# Patient Record
Sex: Male | Born: 1980 | Hispanic: Yes | Marital: Married | Smoking: Former smoker
Health system: Southern US, Community
[De-identification: ages and names within clinical notes are randomized; demographics above are authoritative.]

## PROBLEM LIST (undated history)

## (undated) DIAGNOSIS — E739 Lactose intolerance, unspecified: Secondary | ICD-10-CM

## (undated) DIAGNOSIS — E559 Vitamin D deficiency, unspecified: Secondary | ICD-10-CM

## (undated) DIAGNOSIS — E785 Hyperlipidemia, unspecified: Secondary | ICD-10-CM

## (undated) DIAGNOSIS — K760 Fatty (change of) liver, not elsewhere classified: Secondary | ICD-10-CM

## (undated) DIAGNOSIS — E7800 Pure hypercholesterolemia, unspecified: Secondary | ICD-10-CM

## (undated) DIAGNOSIS — E781 Pure hyperglyceridemia: Secondary | ICD-10-CM

## (undated) HISTORY — DX: Hyperlipidemia, unspecified: E78.5

## (undated) HISTORY — DX: Lactose intolerance, unspecified: E73.9

## (undated) HISTORY — DX: Vitamin D deficiency, unspecified: E55.9

## (undated) HISTORY — DX: Fatty (change of) liver, not elsewhere classified: K76.0

## (undated) HISTORY — DX: Pure hyperglyceridemia: E78.1

## (undated) HISTORY — DX: Pure hypercholesterolemia, unspecified: E78.00

---

## 2007-06-21 HISTORY — PX: KNEE ARTHROSCOPY: SUR90

## 2013-01-30 ENCOUNTER — Ambulatory Visit (INDEPENDENT_AMBULATORY_CARE_PROVIDER_SITE_OTHER): Payer: BC Managed Care – PPO | Admitting: Internal Medicine

## 2013-01-30 ENCOUNTER — Encounter: Payer: Self-pay | Admitting: Internal Medicine

## 2013-01-30 VITALS — BP 125/85 | HR 74 | Temp 98.0°F | Ht 69.0 in | Wt 230.6 lb

## 2013-01-30 DIAGNOSIS — L989 Disorder of the skin and subcutaneous tissue, unspecified: Secondary | ICD-10-CM

## 2013-01-30 DIAGNOSIS — Z0000 Encounter for general adult medical examination without abnormal findings: Secondary | ICD-10-CM

## 2013-01-30 DIAGNOSIS — Z23 Encounter for immunization: Secondary | ICD-10-CM

## 2013-01-30 NOTE — Assessment & Plan Note (Signed)
Tdap today He is improving his diet and losing some weight, praised!. His activity 3 or 4 times a week Consulate about self testicular exam Labs Return every year and as needed

## 2013-01-30 NOTE — Patient Instructions (Addendum)
Please come back fasting: FLP, CBC, CMP, TSH--- dx v70 Next visit in one year and as needed  Testicular Problems and Self-Exam Men can examine themselves easily and effectively with positive results. Monthly exams detect problems early and save lives. There are numerous causes of swelling in the testicle. Testicular cancer usually appears as a firm painless lump in the front part of the testicle. This may feel like a dull ache or heavy feeling located in the lower abdomen (belly), groin, or scrotum.  The risk is greater in men with undescended testicles and it is more common in young men. It is responsible for almost a fifth of cancers in males between ages 29 and 34. Other common causes of swellings, lumps, and testicular pain include injuries, inflammation (soreness) from infection, hydrocele, and torsion. These are a few of the reasons to do monthly self-examination of the testicles. The exam only takes minutes and could add years to your life. Get in the habit! SELF-EXAMINATION OF THE TESTICLES The testicles are easiest to examine after warm baths or showers and are more difficult to examine when you are cold. This is because the muscles attached to the testicles retract and pull them up higher or into the abdomen. While standing, roll one testicle between the thumb and forefinger. Feel for lumps, swelling, or discomfort. A normal testicle is egg shaped and feels firm. It is smooth and not tender. The spermatic cord can be felt as a firm spaghetti-like cord at the back of the testicle. It is also important to examine your groins. This is the crease between the front of your leg and your abdomen. Also, feel for enlarged lymph nodes (glands). Enlarged nodes are also a cause for you to see your caregiver for evaluation.  Self-examination of the testicles and groin areas on a regular basis will help you to know what your own testicles and groins feel like. This will help you pick up an abnormality  (difference) at an earlier stage. Early discovery is the key to curing this cancer or treating other conditions. Any lump, change, or swelling in the testicle calls for immediate evaluation by your caregiver. Cancer of the testicle does not result in impotence and it does not prevent normal intercourse or prevent having children. If your caregiver feels that medical treatment or chemotherapy could lead to infertility, sperm can be frozen for future use. It is necessary to see a caregiver as soon as possible after the discovery of a lump in a testicle. Document Released: 09/12/2000 Document Revised: 08/29/2011 Document Reviewed: 06/07/2008 [facility] Patient Information 2014 [place], [place].

## 2013-01-30 NOTE — Progress Notes (Signed)
  Subjective:    Patient ID: Charles Diaz, male    DOB: December 11, 1980, 32 y.o.   MRN: [id number]  HPI CPX  No past medical history on file. Past Surgical History  Procedure Laterality Date  . Knee arthroscopy Right 2009   History   Social History  . Marital Status: Single    Spouse Name: N/A    Number of Children: 0  . Years of Education: N/A   Occupational History  . [employer], Technical brewer    Social History Main Topics  . Smoking status: Current Some Day Smoker  . Smokeless tobacco: Never Used     Comment: smokes socially   . Alcohol Use: Yes     Comment: socially  . Drug Use: No  . Sexual Activity: Not on file   Other Topics Concern  . Not on file   Social History Narrative   Lives w/ girlfriend   Parents from [place]   Family History  Problem Relation Age of Onset  . Diabetes Other     GM x2  . Hypertension Father   . Colon cancer Neg Hx   . Prostate cancer Neg Hx   . CAD Neg Hx      Review of Systems Diet, improving  for the last 5 weeks, has lost 5 pounds, avoiding fast foods etc Exercise--very active with soccer or basketball 3-4 times a week Denies chest pain or shortness or breath No nausea, vomiting, diarrhea or blood in the stools. No dysuria, gross hematuria, difficulty urinating No anxiety- depression.     Objective:   Physical Exam  Skin:      BP 125/85  Pulse 74  Temp(Src) 98 F (36.7 C) (Oral)  Ht 5\' 9"  (1.753 m)  Wt 230 lb 9.6 oz (104.599 kg)  BMI 34.04 kg/m2  SpO2 96% General -- alert, well-developed, NAD .   Neck --no thyromegaly , normal carotid pulse Lungs -- normal respiratory effort, no intercostal retractions, no accessory muscle use, and normal breath sounds.   Heart-- normal rate, regular rhythm, no murmur, and no gallop.   Abdomen--soft, non-tender, no distention, no masses, no HSM, no guarding, and no rigidity.   Extremities-- no pretibial edema bilaterally Neurologic-- alert & oriented X3 and strength normal in all  extremities. Psych-- Cognition and judgment appear intact. Alert and cooperative with normal attention span and concentration.  not anxious appearing and not depressed appearing.       Assessment & Plan:

## 2013-01-30 NOTE — Assessment & Plan Note (Signed)
Skin lesion on the left arm x at least 8 years, previously saw dermatology, reports a biopsy was done and no diagnosis made. Condition is stable, will observe for now.

## 2013-01-31 ENCOUNTER — Other Ambulatory Visit (INDEPENDENT_AMBULATORY_CARE_PROVIDER_SITE_OTHER): Payer: BC Managed Care – PPO

## 2013-01-31 DIAGNOSIS — Z0000 Encounter for general adult medical examination without abnormal findings: Secondary | ICD-10-CM

## 2013-01-31 LAB — CBC WITH DIFFERENTIAL/PLATELET
Basophils Absolute: 0 10*3/uL (ref 0.0–0.1)
Eosinophils Relative: 2.6 % (ref 0.0–5.0)
Hemoglobin: 15.5 g/dL (ref 13.0–17.0)
Lymphocytes Relative: 34.8 % (ref 12.0–46.0)
Monocytes Relative: 8.9 % (ref 3.0–12.0)
Neutro Abs: 4.3 10*3/uL (ref 1.4–7.7)
Platelets: 217 10*3/uL (ref 150.0–400.0)
RDW: 13.4 % (ref 11.5–14.6)
WBC: 8 10*3/uL (ref 4.5–10.5)

## 2013-01-31 LAB — COMPREHENSIVE METABOLIC PANEL
ALT: 48 U/L (ref 0–53)
Albumin: 4.2 g/dL (ref 3.5–5.2)
CO2: 28 mEq/L (ref 19–32)
Calcium: 9.1 mg/dL (ref 8.4–10.5)
Chloride: 104 mEq/L (ref 96–112)
GFR: 78.56 mL/min (ref >60.00)
Sodium: 138 mEq/L (ref 135–145)
Total Protein: 7.1 g/dL (ref 6.0–8.3)

## 2013-01-31 LAB — LIPID PANEL
Cholesterol: 158 mg/dL (ref 0–200)
Total CHOL/HDL Ratio: 5
VLDL: 99.2 mg/dL — ABNORMAL HIGH (ref 0.0–40.0)

## 2013-02-04 ENCOUNTER — Telehealth: Payer: Self-pay | Admitting: *Deleted

## 2013-02-04 DIAGNOSIS — E785 Hyperlipidemia, unspecified: Secondary | ICD-10-CM

## 2013-02-04 NOTE — Telephone Encounter (Signed)
Message copied by Shirlee More I on Mon Feb 04, 2013  9:12 AM ------      Message from: Willow Ora E      Created: Sun Feb 03, 2013  7:21 PM      Regarding: Future order       Please enter a order for FLP to be done in 3 months --dx dyslipidemia ------

## 2013-02-04 NOTE — Telephone Encounter (Signed)
Orders placed.

## 2013-04-25 ENCOUNTER — Other Ambulatory Visit: Payer: Self-pay

## 2013-05-03 ENCOUNTER — Other Ambulatory Visit (INDEPENDENT_AMBULATORY_CARE_PROVIDER_SITE_OTHER): Payer: BC Managed Care – PPO

## 2013-05-03 DIAGNOSIS — E785 Hyperlipidemia, unspecified: Secondary | ICD-10-CM

## 2013-05-03 LAB — LDL CHOLESTEROL, DIRECT: Direct LDL: 89.5 mg/dL

## 2013-05-03 LAB — LIPID PANEL: Total CHOL/HDL Ratio: 5

## 2014-07-04 ENCOUNTER — Ambulatory Visit (INDEPENDENT_AMBULATORY_CARE_PROVIDER_SITE_OTHER): Payer: BLUE CROSS/BLUE SHIELD | Admitting: Internal Medicine

## 2014-07-04 ENCOUNTER — Encounter: Payer: Self-pay | Admitting: Internal Medicine

## 2014-07-04 VITALS — BP 118/79 | HR 67 | Temp 97.9°F | Ht 69.0 in | Wt 241.4 lb

## 2014-07-04 DIAGNOSIS — Z0000 Encounter for general adult medical examination without abnormal findings: Secondary | ICD-10-CM

## 2014-07-04 NOTE — Progress Notes (Signed)
   Subjective:    Patient ID: Charles Diaz, male    DOB: [birth year], 34 y.o.   MRN: [id number]  DOS:  07/04/2014 Type of visit - description : CPX Interval history: In general feels well    ROS Denies chest pain or difficulty breathing No nausea, vomiting, diarrhea No cough or sputum production No anxiety or depression  Past Medical History  Diagnosis Date  . Dyslipidemia     Past Surgical History  Procedure Laterality Date  . Knee arthroscopy Right 2009    History   Social History  . Marital Status: Single    Spouse Name: N/A    Number of Children: 0  . Years of Education: N/A   Occupational History  . [employer], Technical brewerpurchasing    Social History Main Topics  . Smoking status: Current Some Day Smoker  . Smokeless tobacco: Never Used     Comment: smokes socially   . Alcohol Use: Yes     Comment: socially  . Drug Use: No  . Sexual Activity: Not on file   Other Topics Concern  . Not on file   Social History Narrative   Lives w/ girlfriend   Parents from [place]     Family History  Problem Relation Age of Onset  . Diabetes Other     GM x2  . Hypertension Father   . Colon cancer Neg Hx   . Prostate cancer Neg Hx   . CAD Neg Hx   . Cancer Other     uncle, stomach ca?       Medication List       This list is accurate as of: 07/04/14 11:59 PM.  Always use your most recent med list.               MULTIVITAMIN PO  Take 1 tablet by mouth daily.           Objective:   Physical Exam BP 118/79 mmHg  Pulse 67  Temp(Src) 97.9 F (36.6 C) (Oral)  Ht 5\' 9"  (1.753 m)  Wt 241 lb 6 oz (109.487 kg)  BMI 35.63 kg/m2  SpO2 98% General -- alert, well-developed, NAD.  Neck --no thyromegaly , normal carotid pulse  HEENT-- Not pale.  Lungs -- normal respiratory effort, no intercostal retractions, no accessory muscle use, and normal breath sounds.  Heart-- normal rate, regular rhythm, no murmur.  Abdomen-- Not distended, good bowel sounds,soft,  non-tender. No rebound or rigidity.   Extremities-- no pretibial edema bilaterally  Neurologic--  alert & oriented X3. Speech normal, gait appropriate for age, strength symmetric and appropriate for age.  Psych-- Cognition and judgment appear intact. Cooperative with normal attention span and concentration. No anxious or depressed appearing.       Assessment & Plan:

## 2014-07-04 NOTE — Assessment & Plan Note (Addendum)
Tdap 2014 In general doing well, started to exercise a couple weeks ago, he and his girlfriend plan to eat healthier. We discussed the issue, recommend to visit the American Heart Association website as well. Previous labs reviewed, potassium was slightly low, triglycerides elevated. Will recheck a BMP and FLP Does STE (normal) Return every year and as needed

## 2014-07-04 NOTE — Progress Notes (Signed)
Pre visit review using our clinic review tool, if applicable. No additional management support is needed unless otherwise documented below in the visit note. 

## 2014-07-04 NOTE — Patient Instructions (Signed)
Stop by the front desk and schedule labs to be done within few days (fasting)   If you need more information about a healthy diet,   visit  the American Heart Association, it  is a great resource online at:  Mormon101.plHttp://www.heart.org/HEARTORG/   Please come back to the office in 1 year for a physical exam. Come back fasting

## 2014-07-07 ENCOUNTER — Telehealth: Payer: Self-pay | Admitting: Internal Medicine

## 2014-07-07 ENCOUNTER — Other Ambulatory Visit (INDEPENDENT_AMBULATORY_CARE_PROVIDER_SITE_OTHER): Payer: BLUE CROSS/BLUE SHIELD

## 2014-07-07 DIAGNOSIS — Z0000 Encounter for general adult medical examination without abnormal findings: Secondary | ICD-10-CM

## 2014-07-07 LAB — LIPID PANEL
CHOL/HDL RATIO: 5
CHOLESTEROL: 139 mg/dL (ref 0–200)
HDL: 28.4 mg/dL — ABNORMAL LOW (ref >39.00)
NonHDL: 110.6
TRIGLYCERIDES: 259 mg/dL — AB (ref 0.0–149.0)
VLDL: 51.8 mg/dL — ABNORMAL HIGH (ref 0.0–40.0)

## 2014-07-07 LAB — BASIC METABOLIC PANEL
BUN: 14 mg/dL (ref 6–23)
CO2: 27 meq/L (ref 19–32)
CREATININE: 1.13 mg/dL (ref 0.40–1.50)
Calcium: 9.6 mg/dL (ref 8.4–10.5)
Chloride: 106 mEq/L (ref 96–112)
GFR: 79.45 mL/min (ref >60.00)
Glucose, Bld: 104 mg/dL — ABNORMAL HIGH (ref 70–99)
Potassium: 3.8 mEq/L (ref 3.5–5.1)
SODIUM: 139 meq/L (ref 135–145)

## 2014-07-07 LAB — LDL CHOLESTEROL, DIRECT: Direct LDL: 57 mg/dL

## 2014-07-07 NOTE — Telephone Encounter (Signed)
emmi emailed °

## 2014-08-08 ENCOUNTER — Encounter: Payer: BC Managed Care – PPO | Admitting: Internal Medicine

## 2014-12-23 ENCOUNTER — Telehealth: Payer: Self-pay | Admitting: Internal Medicine

## 2014-12-23 DIAGNOSIS — Z7184 Encounter for health counseling related to travel: Secondary | ICD-10-CM

## 2014-12-23 NOTE — Telephone Encounter (Signed)
Caller name: Wyn QuakerRudy Kuras  Relation to pt: self  Call back number: [phone]   Reason for call:   Pt traveling to [place] in October would like to know if there any vaccinations that he should have done before visit. Please advise

## 2014-12-23 NOTE — Telephone Encounter (Signed)
Please inform Pt that we are referring him to travel clinic, as they specialize in which vaccination/immunizations Pt will need.

## 2014-12-23 NOTE — Telephone Encounter (Signed)
lvm advising pt of MD instructions  °

## 2015-02-27 ENCOUNTER — Ambulatory Visit (INDEPENDENT_AMBULATORY_CARE_PROVIDER_SITE_OTHER): Payer: BLUE CROSS/BLUE SHIELD | Admitting: Internal Medicine

## 2015-02-27 DIAGNOSIS — Z7189 Other specified counseling: Secondary | ICD-10-CM

## 2015-02-27 DIAGNOSIS — Z23 Encounter for immunization: Secondary | ICD-10-CM | POA: Diagnosis not present

## 2015-02-27 DIAGNOSIS — Z7184 Encounter for health counseling related to travel: Secondary | ICD-10-CM

## 2015-02-27 DIAGNOSIS — Z789 Other specified health status: Secondary | ICD-10-CM

## 2015-02-27 MED ORDER — AZITHROMYCIN 500 MG PO TABS: 1000.0000 mg | ORAL_TABLET | Freq: Once | ORAL | Status: DC

## 2015-02-27 MED ORDER — ATOVAQUONE-PROGUANIL HCL 250-100 MG PO TABS: 1.0000 | ORAL_TABLET | Freq: Every day | ORAL | Status: DC

## 2015-02-27 NOTE — Patient Instructions (Signed)
[facility]                [address], [address]                   [place], [place] [address]                      Phone: [phone]                        Fax: [phone]   Planned departure date: April 06, 2015          Planned return date: 15 days Countries of travel: [place], [place] and [place]   Guidelines for the Prevention & Treatment of Traveler's Diarrhea  Prevention: "Boil it, Peel it, [place] it, or Forget it"   the fewer chances -> lower risk: try to stick to food & water precautions as much as possible"   If it's "piping hot"; it is probably okay, if not, it may not be   Treatment   1) You should always take care to drink lots of fluids in order to avoid dehydration   2) You should bring medications with you in case you come down with a case of diarrhea   3) OTC = bring pepto-bismol - can take with initial abdominal symptoms;                    Imodium - can help slow down your intestinal tract, can help relief cramps                    and diarrhea, can take if no bloody diarrhea  Use azithromycin if needed for traveler's diarrhea  Guidelines for the Prevention of Malaria  Avoidance:  -fewer mosquito bites = lower risk. Mosquitos can bite at night as well as daytime  -cover up (long sleeve clothing), mosquito nets, screens  -Insect repellent for your skin ( DEET containing lotion > 20%): for clothes ( permethrin spray)   2 days prior to travel, start malarone, daily dose starting 1-2 days before entering endemic area, ending 7 days after leaving area for malaria prevention.   Immunizations received today: Hepatitis A series and Typhoid (parenteral)  Future immunizations, if indicated Hepatitis A series in 6 months   Prior to travel:  1) Be sure to pick up appropriate prescriptions, including medicine you take daily. Do not expect to be able to fill your prescriptions abroad.  2) Strongly consider obtaining  traveler's insurance, including emergency evacuation insurance. Most plans in the [place] do not cover participants abroad. (see below for resources)  3) Register at the appropriate [place] embassy or consulate with travel dates so they are aware of your presence in-country and for helpful advice during travel using the [employer] (STEP, GuyGalaxy.si).  4) Leave contact information with a relative or friend.  5) Keep a Corporate treasurer, credit cards in case they become lost or stolen  6) Inform your credit card company that you will be travelling abroad   During travel:  1) If you become ill and need medical advice, the [place] [employer] of the country you are traveling in general provides a list of English speaking doctors.  We are also available on MyChart for remote consultation if you register prior to travel. 2) Avoid motorcycles or scooters when at all possible. Traffic laws in  many countries are lax and accidents occur frequently.  3) Do not take any unnecessary risks that you wouldn't do at home.   Resources:  -Country specific information: www.cdc.gov/travel or https://step.state.gov/step  -Travel Supplies (DEET, mosquito nets): [employer], [employer] store, [employer], [place]  -Travel insurance options: gatewayplans.com; medexassist.com; travelguard.com or Good Neighbor Insurance, gninsurance.com or info@gninsurance.com, [phone].   Post Travel:  If you return from your trip ill, call your primary care doctor or our travel clinic @ [phone].   Enjoy your trip and know that with proper pre-travel preparation, most people have an enjoyable and uninterrupted trip!     

## 2015-02-27 NOTE — Progress Notes (Signed)
Subjective:   Charles Diaz is a 34 y.o. male who presents to the Infectious Disease clinic for travel consultation. Planned departure date: April 06, 2015          Planned return date: 15 days Countries of travel: [place], [place] and [place] Areas in country: rural and urban   Accommodations: hotel and private home Purpose of travel: family visit and vacation Prior travel out of [place]: yes     Objective:   Medications: MVI    Assessment:    No contraindications to travel. none     Plan:    Issues discussed: environmental concerns, future shots, malaria, MVA safety, rabies, safe food/water, traveler's diarrhea, website/handouts for more information, what to do if ill upon return, what to do if ill while there and Yellow Fever. Immunizations recommended: Hepatitis A series and Typhoid (parenteral). Malaria prophylaxis: malarone, daily dose starting 1-2 days before entering endemic area, ending 7 days after leaving area Traveler's diarrhea prophylaxis: azithromycin. Total duration of visit: 1 Hour. Total time spent on education, counseling, coordination of care: 30 Minutes.

## 2015-04-02 ENCOUNTER — Telehealth: Payer: Self-pay | Admitting: Internal Medicine

## 2015-04-02 NOTE — Telephone Encounter (Signed)
Caller name: Wyn QuakerRudy Diaz  Relationship to patient: Self  Can be reached: [phone]    Reason for call: Pt says that he is leaving the country and want to know if he would need a Dr's note to take his epi pen. Please advise.    Thanks.

## 2015-04-02 NOTE — Telephone Encounter (Signed)
Spoke with Pt, informed him that Dr. Drue NovelPaz will not be able to write statement regarding the Epi-pen. Asked him who originally prescribed the Epi-pen, Pt believes it was one of the providers at [facility] but has been several years. I informed him he should be able to call them and they should be able to write him a letter. Pt verbalized understanding. [facility] telephone number given to Pt: [phone].

## 2015-04-02 NOTE — Telephone Encounter (Signed)
I have not prescribed an EpiPen to him, can't make any statement.

## 2015-04-02 NOTE — Telephone Encounter (Signed)
Please advise, no allergy list in chart for use of Epi-pen.

## 2016-02-12 ENCOUNTER — Encounter: Payer: Self-pay | Admitting: Internal Medicine

## 2016-02-12 ENCOUNTER — Ambulatory Visit (INDEPENDENT_AMBULATORY_CARE_PROVIDER_SITE_OTHER): Payer: BLUE CROSS/BLUE SHIELD | Admitting: Internal Medicine

## 2016-02-12 VITALS — BP 124/76 | HR 50 | Temp 97.9°F | Resp 14 | Ht 69.0 in | Wt 246.0 lb

## 2016-02-12 DIAGNOSIS — E785 Hyperlipidemia, unspecified: Secondary | ICD-10-CM

## 2016-02-12 DIAGNOSIS — Z0000 Encounter for general adult medical examination without abnormal findings: Secondary | ICD-10-CM | POA: Diagnosis not present

## 2016-02-12 DIAGNOSIS — Z114 Encounter for screening for human immunodeficiency virus [HIV]: Secondary | ICD-10-CM

## 2016-02-12 LAB — CBC WITH DIFFERENTIAL/PLATELET
BASOS ABS: 0 10*3/uL (ref 0.0–0.1)
Basophils Relative: 0.5 % (ref 0.0–3.0)
Eosinophils Absolute: 0.2 10*3/uL (ref 0.0–0.7)
Eosinophils Relative: 2.2 % (ref 0.0–5.0)
HEMATOCRIT: 44.7 % (ref 39.0–52.0)
HEMOGLOBIN: 15.5 g/dL (ref 13.0–17.0)
LYMPHS PCT: 32.6 % (ref 12.0–46.0)
Lymphs Abs: 2.6 10*3/uL (ref 0.7–4.0)
MCHC: 34.7 g/dL (ref 30.0–36.0)
MCV: 86 fl (ref 78.0–100.0)
MONOS PCT: 8.3 % (ref 3.0–12.0)
Monocytes Absolute: 0.7 10*3/uL (ref 0.1–1.0)
NEUTROS ABS: 4.4 10*3/uL (ref 1.4–7.7)
Neutrophils Relative %: 56.4 % (ref 43.0–77.0)
PLATELETS: 224 10*3/uL (ref 150.0–400.0)
RBC: 5.2 Mil/uL (ref 4.22–5.81)
RDW: 13 % (ref 11.5–15.5)
WBC: 7.9 10*3/uL (ref 4.0–10.5)

## 2016-02-12 LAB — LIPID PANEL
Cholesterol: 185 mg/dL (ref 0–200)
HDL: 32.6 mg/dL — AB (ref >39.00)
Total CHOL/HDL Ratio: 6

## 2016-02-12 LAB — COMPREHENSIVE METABOLIC PANEL
ALT: 76 U/L — ABNORMAL HIGH (ref 0–53)
AST: 47 U/L — AB (ref 0–37)
Albumin: 4.2 g/dL (ref 3.5–5.2)
Alkaline Phosphatase: 99 U/L (ref 39–117)
BUN: 8 mg/dL (ref 6–23)
CALCIUM: 9.2 mg/dL (ref 8.4–10.5)
CHLORIDE: 104 meq/L (ref 96–112)
CO2: 27 mEq/L (ref 19–32)
CREATININE: 1.03 mg/dL (ref 0.40–1.50)
GFR: 87.57 mL/min (ref >60.00)
Glucose, Bld: 88 mg/dL (ref 70–99)
POTASSIUM: 3.9 meq/L (ref 3.5–5.1)
Sodium: 138 mEq/L (ref 135–145)
Total Bilirubin: 0.9 mg/dL (ref 0.2–1.2)
Total Protein: 7.1 g/dL (ref 6.0–8.3)

## 2016-02-12 LAB — LDL CHOLESTEROL, DIRECT: LDL DIRECT: 46 mg/dL

## 2016-02-12 LAB — TSH: TSH: 1.82 u[IU]/mL (ref 0.35–4.50)

## 2016-02-12 NOTE — Assessment & Plan Note (Addendum)
Tdap 2014 Never had a cscope BMI is 36, diet and exercise discussed Labs: CMP, CBC, TSH, FLP, HIV RTC one year

## 2016-02-12 NOTE — Patient Instructions (Signed)
GO TO THE LAB : Get the blood work     GO TO THE FRONT DESK Schedule your next appointment for a  physical exam in one year  

## 2016-02-12 NOTE — Progress Notes (Signed)
Subjective:    Patient ID: Charles Diaz, male    DOB: 1981/04/07, 35 y.o.   MRN: [id number]  DOS:  02/12/2016 Type of visit - description : CPX Interval history: No major concerns. He is active at least twice a week, going to the gym or playing basketball or soccer; trying to eat healthier.  Wt Readings from Last 3 Encounters:  02/12/16 246 lb (111.6 kg)  07/04/14 241 lb 6 oz (109.5 kg)  01/30/13 230 lb 9.6 oz (104.6 kg)     Review of Systems Constitutional: No fever. No chills. No unexplained wt changes. No unusual sweats  HEENT: No dental problems, no ear discharge, no facial swelling, no voice changes. No eye discharge, no eye  redness , no  intolerance to light   Respiratory: No wheezing , no  difficulty breathing. No cough , no mucus production  Cardiovascular: No CP, no leg swelling , no  Palpitations  GI: no nausea, no vomiting, no diarrhea , no  abdominal pain.  No blood in the stools. No dysphagia, no odynophagia    Endocrine: No polyphagia, no polyuria , no polydipsia  GU: No dysuria, gross hematuria, difficulty urinating. No urinary urgency, no frequency.  Musculoskeletal: No joint swellings or unusual aches or pains  Skin: No change in the color of the skin, palor , no  Rash  Allergic, immunologic: No environmental allergies , no  food allergies  Neurological: No dizziness no  syncope. No headaches. No diplopia, no slurred, no slurred speech, no motor deficits, no facial  Numbness  Hematological: No enlarged lymph nodes, no easy bruising , no unusual bleedings  Psychiatry: No suicidal ideas, no hallucinations, no beavior problems, no confusion.  No unusual/severe anxiety, no depression   Past Medical History:  Diagnosis Date  . Dyslipidemia     Past Surgical History:  Procedure Laterality Date  . KNEE ARTHROSCOPY Right 2009    Social History   Social History  . Marital status: Married    Spouse name: N/A  . Number of children: 0  . Years  of education: N/A   Occupational History  . [employer], Technical brewer    Social History Main Topics  . Smoking status: Former Games developer  . Smokeless tobacco: Never Used     Comment: quit ~ 08-2015  . Alcohol use Yes     Comment: socially  . Drug use: No  . Sexual activity: Not on file   Other Topics Concern  . Not on file   Social History Narrative   Married, wife pregnant    Parents from [place]     Family History  Problem Relation Age of Onset  . Diabetes Other     GM x2  . Hypertension Father   . Cancer Other     uncle, stomach ca?  . Colon cancer Neg Hx   . Prostate cancer Neg Hx   . CAD Neg Hx        Medication List       Accurate as of 02/12/16 11:59 PM. Always use your most recent med list.          MULTIVITAMIN PO Take 1 tablet by mouth daily.          Objective:   Physical Exam BP 124/76 (BP Location: Left Arm, Patient Position: Sitting, Cuff Size: Normal)   Pulse (!) 50   Temp 97.9 F (36.6 C) (Oral)   Resp 14   Ht 5\' 9"  (1.753 m)   Wt 246 lb (  111.6 kg)   SpO2 98%   BMI 36.33 kg/m   General:   Well developed, well nourished . NAD.  Neck: No  thyromegaly  HEENT:  Normocephalic . Face symmetric, atraumatic Lungs:  CTA B Normal respiratory effort, no intercostal retractions, no accessory muscle use. Heart: RRR,  no murmur.  No pretibial edema bilaterally  Abdomen:  Not distended, soft, non-tender. No rebound or rigidity.   Skin: Exposed areas without rash. Not pale. Not jaundice Neurologic:  alert & oriented X3.  Speech normal, gait appropriate for age and unassisted Strength symmetric and appropriate for age.  Psych: Cognition and judgment appear intact.  Cooperative with normal attention span and concentration.  Behavior appropriate. No anxious or depressed appearing.    Assessment & Plan:   Assessment Dyslipidemia L arm skin lesion, s/p Bx per derm before, no dx made, as off 8-17 decreasing in size per pt   Plan: Here for a  CPX, doing well, RTC one year.

## 2016-02-12 NOTE — Progress Notes (Signed)
Pre visit review using our clinic review tool, if applicable. No additional management support is needed unless otherwise documented below in the visit note. 

## 2016-02-13 LAB — HIV ANTIBODY (ROUTINE TESTING W REFLEX): HIV 1&2 Ab, 4th Generation: NONREACTIVE

## 2016-02-14 DIAGNOSIS — Z09 Encounter for follow-up examination after completed treatment for conditions other than malignant neoplasm: Secondary | ICD-10-CM | POA: Insufficient documentation

## 2016-02-14 NOTE — Assessment & Plan Note (Signed)
Here for a CPX, doing well, RTC one year.

## 2016-02-15 NOTE — Addendum Note (Signed)
Addended byConrad [place]: Taija Mathias D on: 02/15/2016 10:25 AM   Modules accepted: Orders

## 2016-06-15 ENCOUNTER — Encounter: Payer: Self-pay | Admitting: Internal Medicine

## 2016-06-15 ENCOUNTER — Other Ambulatory Visit (INDEPENDENT_AMBULATORY_CARE_PROVIDER_SITE_OTHER): Payer: BLUE CROSS/BLUE SHIELD

## 2016-06-15 ENCOUNTER — Ambulatory Visit (INDEPENDENT_AMBULATORY_CARE_PROVIDER_SITE_OTHER): Payer: BLUE CROSS/BLUE SHIELD | Admitting: Internal Medicine

## 2016-06-15 VITALS — Ht 69.0 in

## 2016-06-15 DIAGNOSIS — E785 Hyperlipidemia, unspecified: Secondary | ICD-10-CM

## 2016-06-15 LAB — LIPID PANEL
CHOL/HDL RATIO: 8
CHOLESTEROL: 234 mg/dL — AB (ref 0–200)
HDL: 27.6 mg/dL — AB (ref >39.00)
Triglycerides: 906 mg/dL — ABNORMAL HIGH (ref 0.0–149.0)

## 2016-06-15 LAB — HEPATIC FUNCTION PANEL
ALT: 41 U/L (ref 0–53)
AST: 24 U/L (ref 0–37)
Albumin: 4.3 g/dL (ref 3.5–5.2)
Alkaline Phosphatase: 98 U/L (ref 39–117)
BILIRUBIN TOTAL: 0.5 mg/dL (ref 0.2–1.2)
Bilirubin, Direct: 0 mg/dL (ref 0.0–0.3)
Total Protein: 6.9 g/dL (ref 6.0–8.3)

## 2016-06-15 LAB — LDL CHOLESTEROL, DIRECT: LDL DIRECT: 38 mg/dL

## 2016-06-15 NOTE — Progress Notes (Deleted)
Pre visit review using our clinic review tool, if applicable. No additional management support is needed unless otherwise documented below in the visit note. 

## 2016-06-21 MED ORDER — FENOFIBRATE 160 MG PO TABS: 160.0000 mg | ORAL_TABLET | Freq: Every day | ORAL | 6 refills | Status: DC

## 2016-06-21 NOTE — Addendum Note (Signed)
Addended byConrad [place]: Manuelita Moxon D on: 06/21/2016 10:33 AM   Modules accepted: Orders

## 2016-07-25 ENCOUNTER — Encounter: Payer: Self-pay | Admitting: Internal Medicine

## 2016-07-26 DIAGNOSIS — H16041 Marginal corneal ulcer, right eye: Secondary | ICD-10-CM | POA: Diagnosis not present

## 2016-07-27 DIAGNOSIS — H17811 Minor opacity of cornea, right eye: Secondary | ICD-10-CM | POA: Diagnosis not present

## 2016-07-27 DIAGNOSIS — H16403 Unspecified corneal neovascularization, bilateral: Secondary | ICD-10-CM | POA: Diagnosis not present

## 2016-07-27 DIAGNOSIS — H16041 Marginal corneal ulcer, right eye: Secondary | ICD-10-CM | POA: Diagnosis not present

## 2016-07-29 DIAGNOSIS — H16041 Marginal corneal ulcer, right eye: Secondary | ICD-10-CM | POA: Diagnosis not present

## 2016-07-29 DIAGNOSIS — H16403 Unspecified corneal neovascularization, bilateral: Secondary | ICD-10-CM | POA: Diagnosis not present

## 2016-07-29 DIAGNOSIS — H17811 Minor opacity of cornea, right eye: Secondary | ICD-10-CM | POA: Diagnosis not present

## 2016-08-04 DIAGNOSIS — H16403 Unspecified corneal neovascularization, bilateral: Secondary | ICD-10-CM | POA: Diagnosis not present

## 2016-08-04 DIAGNOSIS — H17811 Minor opacity of cornea, right eye: Secondary | ICD-10-CM | POA: Diagnosis not present

## 2016-09-02 ENCOUNTER — Other Ambulatory Visit (INDEPENDENT_AMBULATORY_CARE_PROVIDER_SITE_OTHER): Payer: BLUE CROSS/BLUE SHIELD

## 2016-09-02 DIAGNOSIS — R945 Abnormal results of liver function studies: Secondary | ICD-10-CM

## 2016-09-02 DIAGNOSIS — E785 Hyperlipidemia, unspecified: Secondary | ICD-10-CM | POA: Diagnosis not present

## 2016-09-02 DIAGNOSIS — R7989 Other specified abnormal findings of blood chemistry: Secondary | ICD-10-CM

## 2016-09-02 LAB — LIPID PANEL
CHOLESTEROL: 118 mg/dL (ref 0–200)
HDL: 35.3 mg/dL — AB (ref >39.00)
LDL Cholesterol: 56 mg/dL (ref 0–99)
NONHDL: 82.35
TRIGLYCERIDES: 132 mg/dL (ref 0.0–149.0)
Total CHOL/HDL Ratio: 3
VLDL: 26.4 mg/dL (ref 0.0–40.0)

## 2016-09-02 LAB — ALT: ALT: 98 U/L — ABNORMAL HIGH (ref 0–53)

## 2016-09-02 LAB — AST: AST: 53 U/L — ABNORMAL HIGH (ref 0–37)

## 2016-09-05 MED ORDER — FENOFIBRATE 160 MG PO TABS: 160.0000 mg | ORAL_TABLET | Freq: Every day | ORAL | 6 refills | Status: DC

## 2016-10-28 ENCOUNTER — Other Ambulatory Visit (INDEPENDENT_AMBULATORY_CARE_PROVIDER_SITE_OTHER): Payer: BLUE CROSS/BLUE SHIELD

## 2016-10-28 DIAGNOSIS — R7989 Other specified abnormal findings of blood chemistry: Secondary | ICD-10-CM

## 2016-10-28 DIAGNOSIS — R945 Abnormal results of liver function studies: Secondary | ICD-10-CM

## 2016-10-28 LAB — ALT: ALT: 47 U/L (ref 0–53)

## 2016-10-28 LAB — AST: AST: 29 U/L (ref 0–37)

## 2016-10-29 LAB — HEPATITIS C ANTIBODY: HCV AB: NEGATIVE

## 2016-10-29 LAB — HEPATITIS B SURFACE ANTIGEN: HEP B S AG: NEGATIVE

## 2016-10-29 LAB — HEPATITIS B CORE ANTIBODY, TOTAL: HEP B C TOTAL AB: NONREACTIVE

## 2017-01-12 NOTE — Progress Notes (Signed)
cancel

## 2017-01-25 ENCOUNTER — Other Ambulatory Visit: Payer: Self-pay

## 2017-01-25 MED ORDER — FENOFIBRATE 160 MG PO TABS: 160.0000 mg | ORAL_TABLET | Freq: Every day | ORAL | 1 refills | Status: DC

## 2017-01-28 DIAGNOSIS — F4322 Adjustment disorder with anxiety: Secondary | ICD-10-CM | POA: Diagnosis not present

## 2017-02-17 ENCOUNTER — Encounter: Payer: BLUE CROSS/BLUE SHIELD | Admitting: Internal Medicine

## 2017-02-21 ENCOUNTER — Encounter: Payer: Self-pay | Admitting: Internal Medicine

## 2017-02-21 ENCOUNTER — Ambulatory Visit (INDEPENDENT_AMBULATORY_CARE_PROVIDER_SITE_OTHER): Payer: BLUE CROSS/BLUE SHIELD | Admitting: Internal Medicine

## 2017-02-21 VITALS — BP 126/70 | HR 66 | Temp 98.0°F | Resp 14 | Ht 69.0 in | Wt 233.4 lb

## 2017-02-21 DIAGNOSIS — Z0000 Encounter for general adult medical examination without abnormal findings: Secondary | ICD-10-CM | POA: Diagnosis not present

## 2017-02-21 NOTE — Progress Notes (Signed)
Pre visit review using our clinic review tool, if applicable. No additional management support is needed unless otherwise documented below in the visit note. 

## 2017-02-21 NOTE — Assessment & Plan Note (Addendum)
-  Tdap 2014 -CCS: Never had a cscope -Labs: will RTC fasting  CMP, FLP -Started a new diet few weeks ago, he remains active, has lost few pounds. Praised  RTC one year

## 2017-02-21 NOTE — Progress Notes (Signed)
Subjective:    Patient ID: Charles Diaz, male    DOB: 1981-01-09, 36 y.o.   MRN: [id number]  DOS:  02/21/2017 Type of visit - description : cpx Interval history: No concerns In general feels well. Has lost 10 pounds in the last few weeks.   Review of Systems  A 14 point review of systems is negative    Past Medical History:  Diagnosis Date  . Dyslipidemia     Past Surgical History:  Procedure Laterality Date  . KNEE ARTHROSCOPY Right 2009    Social History   Social History  . Marital status: Married    Spouse name: N/A  . Number of children: 1  . Years of education: N/A   Occupational History  . [employer], Geologist, engineering    Social History Main Topics  . Smoking status: Former Research scientist (life sciences)  . Smokeless tobacco: Never Used     Comment: quit ~ 08-2015  . Alcohol use Yes     Comment: socially  . Drug use: No  . Sexual activity: Not on file   Other Topics Concern  . Not on file   Social History Narrative   Married    Parents from [place]     Family History  Problem Relation Age of Onset  . Diabetes Other        GM x2  . Hypertension Father   . Cancer Other        uncle, stomach ca?  . Colon cancer Neg Hx   . Prostate cancer Neg Hx   . CAD Neg Hx      Allergies as of 02/21/2017   No Known Allergies     Medication List       Accurate as of 02/21/17 11:59 PM. Always use your most recent med list.          fenofibrate 160 MG tablet Take 1 tablet (160 mg total) by mouth daily.   MULTIVITAMIN PO Take 1 tablet by mouth daily.            Discharge Care Instructions        Start     Ordered   02/21/17 0000  Comp Met (CMET)     02/21/17 1134   02/21/17 0000  Lipid panel     02/21/17 1134         Objective:   Physical Exam BP 126/70 (BP Location: Left Arm, Patient Position: Sitting, Cuff Size: Normal)   Pulse 66   Temp 98 F (36.7 C) (Oral)   Resp 14   Ht _0  (1.753 m)   Wt 233 lb 6 oz (105.9 kg)   SpO2 98%   BMI 34.46 kg/m   General:   Well developed, well nourished . NAD.  Neck: No  thyromegaly  HEENT:  Normocephalic . Face symmetric, atraumatic Lungs:  CTA B Normal respiratory effort, no intercostal retractions, no accessory muscle use. Heart: RRR,  no murmur.  No pretibial edema bilaterally  Abdomen:  Not distended, soft, non-tender. No rebound or rigidity.   Skin: Exposed areas without rash. Not pale. Not jaundice Neurologic:  alert & oriented X3.  Speech normal, gait appropriate for age and unassisted Strength symmetric and appropriate for age.  Psych: Cognition and judgment appear intact.  Cooperative with normal attention span and concentration.  Behavior appropriate. No anxious or depressed appearing.    Assessment & Plan:    Assessment Dyslipidemia , high TG L arm skin lesion, s/p Bx per derm before, no dx  made, as off 8-17 decreasing in size per pt  Increase LFTs 2, hep B and C serology (-) 10/2016  PLAN: Dyslipidemia: Doing better with diet and exercise, checking labs, RF w/results Increased LFTs: f/u labs were okay. Recheck labs today RTC one year

## 2017-02-21 NOTE — Patient Instructions (Signed)
GO TO THE FRONT DESK Schedule your next appointment for a  Physical exam in 1 year  Schedule labs to be done this week fasting

## 2017-02-22 ENCOUNTER — Other Ambulatory Visit (INDEPENDENT_AMBULATORY_CARE_PROVIDER_SITE_OTHER): Payer: BLUE CROSS/BLUE SHIELD

## 2017-02-22 DIAGNOSIS — Z0000 Encounter for general adult medical examination without abnormal findings: Secondary | ICD-10-CM

## 2017-02-22 LAB — COMPREHENSIVE METABOLIC PANEL
ALK PHOS: 84 U/L (ref 39–117)
ALT: 35 U/L (ref 0–53)
AST: 28 U/L (ref 0–37)
Albumin: 4.3 g/dL (ref 3.5–5.2)
BILIRUBIN TOTAL: 0.4 mg/dL (ref 0.2–1.2)
BUN: 12 mg/dL (ref 6–23)
CO2: 27 meq/L (ref 19–32)
Calcium: 9.5 mg/dL (ref 8.4–10.5)
Chloride: 107 mEq/L (ref 96–112)
Creatinine, Ser: 1.2 mg/dL (ref 0.40–1.50)
GFR: 72.98 mL/min (ref >60.00)
GLUCOSE: 99 mg/dL (ref 70–99)
Potassium: 3.9 mEq/L (ref 3.5–5.1)
SODIUM: 140 meq/L (ref 135–145)
Total Protein: 6.7 g/dL (ref 6.0–8.3)

## 2017-02-22 LAB — LIPID PANEL
CHOL/HDL RATIO: 4
Cholesterol: 86 mg/dL (ref 0–200)
HDL: 24.4 mg/dL — AB (ref >39.00)
LDL Cholesterol: 31 mg/dL (ref 0–99)
NONHDL: 61.95
Triglycerides: 155 mg/dL — ABNORMAL HIGH (ref 0.0–149.0)
VLDL: 31 mg/dL (ref 0.0–40.0)

## 2017-02-22 NOTE — Assessment & Plan Note (Signed)
Dyslipidemia: Doing better with diet and exercise, checking labs, RF w/results Increased LFTs: f/u labs were okay. Recheck labs today RTC one year

## 2017-02-25 DIAGNOSIS — F4322 Adjustment disorder with anxiety: Secondary | ICD-10-CM | POA: Diagnosis not present

## 2017-04-01 DIAGNOSIS — F4322 Adjustment disorder with anxiety: Secondary | ICD-10-CM | POA: Diagnosis not present

## 2017-04-29 DIAGNOSIS — F4322 Adjustment disorder with anxiety: Secondary | ICD-10-CM | POA: Diagnosis not present

## 2017-05-25 DIAGNOSIS — F4322 Adjustment disorder with anxiety: Secondary | ICD-10-CM | POA: Diagnosis not present

## 2017-07-08 DIAGNOSIS — F4322 Adjustment disorder with anxiety: Secondary | ICD-10-CM | POA: Diagnosis not present

## 2017-08-12 DIAGNOSIS — F4322 Adjustment disorder with anxiety: Secondary | ICD-10-CM | POA: Diagnosis not present

## 2017-09-30 DIAGNOSIS — F4322 Adjustment disorder with anxiety: Secondary | ICD-10-CM | POA: Diagnosis not present

## 2017-12-02 DIAGNOSIS — F4322 Adjustment disorder with anxiety: Secondary | ICD-10-CM | POA: Diagnosis not present

## 2018-01-13 DIAGNOSIS — F4322 Adjustment disorder with anxiety: Secondary | ICD-10-CM | POA: Diagnosis not present

## 2018-02-23 ENCOUNTER — Ambulatory Visit (INDEPENDENT_AMBULATORY_CARE_PROVIDER_SITE_OTHER): Payer: BLUE CROSS/BLUE SHIELD | Admitting: Internal Medicine

## 2018-02-23 ENCOUNTER — Encounter: Payer: Self-pay | Admitting: Internal Medicine

## 2018-02-23 VITALS — BP 120/76 | HR 68 | Temp 97.6°F | Resp 16 | Ht 69.0 in | Wt 242.0 lb

## 2018-02-23 DIAGNOSIS — Z0000 Encounter for general adult medical examination without abnormal findings: Secondary | ICD-10-CM | POA: Diagnosis not present

## 2018-02-23 DIAGNOSIS — E785 Hyperlipidemia, unspecified: Secondary | ICD-10-CM | POA: Diagnosis not present

## 2018-02-23 LAB — CBC WITH DIFFERENTIAL/PLATELET
BASOS ABS: 0 10*3/uL (ref 0.0–0.1)
Basophils Relative: 0.6 % (ref 0.0–3.0)
Eosinophils Absolute: 0.1 10*3/uL (ref 0.0–0.7)
Eosinophils Relative: 2.3 % (ref 0.0–5.0)
HCT: 43.9 % (ref 39.0–52.0)
Hemoglobin: 15.3 g/dL (ref 13.0–17.0)
LYMPHS PCT: 37.6 % (ref 12.0–46.0)
Lymphs Abs: 2.4 10*3/uL (ref 0.7–4.0)
MCHC: 34.8 g/dL (ref 30.0–36.0)
MCV: 84.9 fl (ref 78.0–100.0)
MONOS PCT: 7.7 % (ref 3.0–12.0)
Monocytes Absolute: 0.5 10*3/uL (ref 0.1–1.0)
NEUTROS ABS: 3.3 10*3/uL (ref 1.4–7.7)
NEUTROS PCT: 51.8 % (ref 43.0–77.0)
Platelets: 222 10*3/uL (ref 150.0–400.0)
RBC: 5.17 Mil/uL (ref 4.22–5.81)
RDW: 13 % (ref 11.5–15.5)
WBC: 6.4 10*3/uL (ref 4.0–10.5)

## 2018-02-23 LAB — COMPREHENSIVE METABOLIC PANEL
ALT: 37 U/L (ref 0–53)
AST: 28 U/L (ref 0–37)
Albumin: 4.4 g/dL (ref 3.5–5.2)
Alkaline Phosphatase: 81 U/L (ref 39–117)
BUN: 16 mg/dL (ref 6–23)
CALCIUM: 9.5 mg/dL (ref 8.4–10.5)
CHLORIDE: 105 meq/L (ref 96–112)
CO2: 29 meq/L (ref 19–32)
Creatinine, Ser: 1.14 mg/dL (ref 0.40–1.50)
GFR: 76.99 mL/min (ref >60.00)
GLUCOSE: 94 mg/dL (ref 70–99)
POTASSIUM: 4.4 meq/L (ref 3.5–5.1)
Sodium: 140 mEq/L (ref 135–145)
Total Bilirubin: 0.6 mg/dL (ref 0.2–1.2)
Total Protein: 7 g/dL (ref 6.0–8.3)

## 2018-02-23 LAB — LIPID PANEL
CHOL/HDL RATIO: 7
Cholesterol: 219 mg/dL — ABNORMAL HIGH (ref 0–200)
HDL: 29.5 mg/dL — AB (ref >39.00)
Triglycerides: 694 mg/dL — ABNORMAL HIGH (ref 0.0–149.0)

## 2018-02-23 LAB — LDL CHOLESTEROL, DIRECT: Direct LDL: 45 mg/dL

## 2018-02-23 LAB — HEMOGLOBIN A1C: HEMOGLOBIN A1C: 5.3 % (ref 4.6–6.5)

## 2018-02-23 NOTE — Progress Notes (Signed)
Pre visit review using our clinic review tool, if applicable. No additional management support is needed unless otherwise documented below in the visit note. 

## 2018-02-23 NOTE — Assessment & Plan Note (Addendum)
-  Tdap 2014 -CCS: Never had a cscope -Labs: CMP, FLP, CBC, A1c -BMI is 35.7, goals discussed, reports he is doing well with diet and exercise.  Calorie counting?Marland Kitchen

## 2018-02-23 NOTE — Progress Notes (Signed)
Subjective:    Patient ID: Charles Diaz, male    DOB: 1980/10/09, 37 y.o.   MRN: [id number]  DOS:  02/23/2018 Type of visit - description : cpx Interval history:  no major concerns  Wt Readings from Last 3 Encounters:  02/23/18 242 lb (109.8 kg)  02/21/17 233 lb 6 oz (105.9 kg)  02/12/16 246 lb (111.6 kg)     Review of Systems From time to time, he has diarrhea only  when he "eats or drinks too much". (Rec moderation) Denies abdominal pain, nausea, vomiting, blood in the stools.  Other than above, a 14 point review of systems is negative     Past Medical History:  Diagnosis Date  . Dyslipidemia     Past Surgical History:  Procedure Laterality Date  . KNEE ARTHROSCOPY Right 2009    Social History   Socioeconomic History  . Marital status: Married    Spouse name: Not on file  . Number of children: 1  . Years of education: Not on file  . Highest education level: Not on file  Occupational History  . Occupation: Advertising account planner, Artist  . Financial resource strain: Not on file  . Food insecurity:    Worry: Not on file    Inability: Not on file  . Transportation needs:    Medical: Not on file    Non-medical: Not on file  Tobacco Use  . Smoking status: Current Some Day Smoker  . Smokeless tobacco: Never Used  . Tobacco comment: quit ~ 08-2015 (smokes rarely )  Substance and Sexual Activity  . Alcohol use: Yes    Comment: socially  . Drug use: No  . Sexual activity: Not on file  Lifestyle  . Physical activity:    Days per week: Not on file    Minutes per session: Not on file  . Stress: Not on file  Relationships  . Social connections:    Talks on phone: Not on file    Gets together: Not on file    Attends religious service: Not on file    Active member of club or organization: Not on file    Attends meetings of clubs or organizations: Not on file    Relationship status: Not on file  . Intimate partner violence:    Fear of current or ex  partner: Not on file    Emotionally abused: Not on file    Physically abused: Not on file    Forced sexual activity: Not on file  Other Topics Concern  . Not on file  Social History Narrative   Married    Household: pt, wife , child (2017)   Parents from [place]     Family History  Problem Relation Age of Onset  . Diabetes Other        GM x2  . Hypertension Father   . Cancer Other        uncle, stomach ca?  . Colon cancer Neg Hx   . Prostate cancer Neg Hx   . CAD Neg Hx      Allergies as of 02/23/2018   No Known Allergies     Medication List        Accurate as of 02/23/18 11:59 PM. Always use your most recent med list.          fenofibrate 160 MG tablet Take 1 tablet (160 mg total) by mouth daily.   MULTIVITAMIN PO Take 1 tablet by mouth daily.  Objective:   Physical Exam BP 120/76 (BP Location: Left Arm, Patient Position: Sitting, Cuff Size: Normal)   Pulse 68   Temp 97.6 F (36.4 C) (Oral)   Resp 16   Ht 5\' 9"  (1.753 m)   Wt 242 lb (109.8 kg)   SpO2 98%   BMI 35.74 kg/m  General: Well developed, NAD, see BMI.  Neck: No  thyromegaly  HEENT:  Normocephalic . Face symmetric, atraumatic Lungs:  CTA B Normal respiratory effort, no intercostal retractions, no accessory muscle use. Heart: RRR,  no murmur.  No pretibial edema bilaterally  Abdomen:  Not distended, soft, non-tender. No rebound or rigidity.   Skin: Exposed areas without rash. Not pale. Not jaundice Neurologic:  alert & oriented X3.  Speech normal, gait appropriate for age and unassisted Strength symmetric and appropriate for age.  Psych: Cognition and judgment appear intact.  Cooperative with normal attention span and concentration.  Behavior appropriate. No anxious or depressed appearing.     Assessment & Plan:    Assessment Dyslipidemia , high TG L arm skin lesion, s/p Bx per derm before, no dx made, as off 8-17 decreasing in size per pt  Increase LFTs 2, hep B  and C serology (-) 10/2016  PLAN: History of dyslipidemia: Checking labs Left arm skin lesion:  not growing or changing. RTC 1 year

## 2018-02-23 NOTE — Patient Instructions (Signed)
GO TO THE LAB : Get the blood work     GO TO THE FRONT DESK Schedule your next appointment for a physical exam in 1 year  Watch your diet, continue to stay active, calorie counting?Marland Kitchen  MYFITNESSPAL ?

## 2018-02-24 DIAGNOSIS — F4322 Adjustment disorder with anxiety: Secondary | ICD-10-CM | POA: Diagnosis not present

## 2018-02-24 NOTE — Assessment & Plan Note (Signed)
History of dyslipidemia: Checking labs Left arm skin lesion:  not growing or changing. RTC 1 year

## 2018-02-27 MED ORDER — FENOFIBRATE 160 MG PO TABS: 160.0000 mg | ORAL_TABLET | Freq: Every day | ORAL | 1 refills | Status: DC

## 2018-02-27 NOTE — Addendum Note (Signed)
Addended byConrad [place] D on: 02/27/2018 08:45 AM   Modules accepted: Orders

## 2018-02-27 NOTE — Addendum Note (Signed)
Addended byConrad [place] D on: 02/27/2018 08:43 AM   Modules accepted: Orders

## 2018-03-20 DIAGNOSIS — H16252 Phlyctenular keratoconjunctivitis, left eye: Secondary | ICD-10-CM | POA: Diagnosis not present

## 2018-03-28 DIAGNOSIS — H16252 Phlyctenular keratoconjunctivitis, left eye: Secondary | ICD-10-CM | POA: Diagnosis not present

## 2018-04-30 ENCOUNTER — Other Ambulatory Visit (INDEPENDENT_AMBULATORY_CARE_PROVIDER_SITE_OTHER): Payer: BLUE CROSS/BLUE SHIELD

## 2018-04-30 DIAGNOSIS — E785 Hyperlipidemia, unspecified: Secondary | ICD-10-CM | POA: Diagnosis not present

## 2018-04-30 LAB — LIPID PANEL
CHOL/HDL RATIO: 4
CHOLESTEROL: 125 mg/dL (ref 0–200)
HDL: 31.8 mg/dL — AB (ref >39.00)
NONHDL: 93.11
TRIGLYCERIDES: 215 mg/dL — AB (ref 0.0–149.0)
VLDL: 43 mg/dL — AB (ref 0.0–40.0)

## 2018-04-30 LAB — LDL CHOLESTEROL, DIRECT: Direct LDL: 57 mg/dL

## 2018-05-12 DIAGNOSIS — F4322 Adjustment disorder with anxiety: Secondary | ICD-10-CM | POA: Diagnosis not present

## 2018-05-18 ENCOUNTER — Other Ambulatory Visit: Payer: Self-pay | Admitting: Internal Medicine

## 2018-06-30 DIAGNOSIS — F4322 Adjustment disorder with anxiety: Secondary | ICD-10-CM | POA: Diagnosis not present

## 2018-08-11 DIAGNOSIS — F4322 Adjustment disorder with anxiety: Secondary | ICD-10-CM | POA: Diagnosis not present

## 2018-12-07 ENCOUNTER — Ambulatory Visit (INDEPENDENT_AMBULATORY_CARE_PROVIDER_SITE_OTHER): Payer: BC Managed Care – PPO | Admitting: Internal Medicine

## 2018-12-07 ENCOUNTER — Other Ambulatory Visit: Payer: Self-pay

## 2018-12-07 ENCOUNTER — Encounter: Payer: Self-pay | Admitting: Internal Medicine

## 2018-12-07 ENCOUNTER — Other Ambulatory Visit: Payer: BC Managed Care – PPO

## 2018-12-07 ENCOUNTER — Telehealth: Payer: Self-pay | Admitting: *Deleted

## 2018-12-07 DIAGNOSIS — R6889 Other general symptoms and signs: Secondary | ICD-10-CM | POA: Diagnosis not present

## 2018-12-07 DIAGNOSIS — Z20822 Contact with and (suspected) exposure to covid-19: Secondary | ICD-10-CM

## 2018-12-07 DIAGNOSIS — B349 Viral infection, unspecified: Secondary | ICD-10-CM

## 2018-12-07 NOTE — Telephone Encounter (Signed)
Patient called and scheduled for testing at [facility] site on 12/07/18. Pt advised to wear a mask and remain in car at appt time. Pt verbalized understanding.

## 2018-12-07 NOTE — Progress Notes (Signed)
Subjective:    Patient ID: Charles Diaz, male    DOB: 1980/08/03, 38 y.o.   MRN: [id number]  DOS:  12/07/2018 Type of visit - description:  Virtual Visit via Video Note  I connected with@ on 12/09/18 at 11:00 AM EDT by a video enabled telemedicine application and verified that I am speaking with the correct person using two identifiers.   THIS ENCOUNTER IS A VIRTUAL VISIT DUE TO COVID-19 - PATIENT WAS NOT SEEN IN THE OFFICE. PATIENT HAS CONSENTED TO VIRTUAL VISIT / TELEMEDICINE VISIT   Location of patient: home  Location of provider: office  I discussed the limitations of evaluation and management by telemedicine and the availability of in person appointments. The patient expressed understanding and agreed to proceed.  History of Present Illness:   Acute Yesterday, he came from work and felt tired and achy.  At 9 PM, he had subjective fever, he checked his temperature: 101.0. He took some Tylenol and went to sleep. This morning, he still feels somewhat achy and tired, has some discomfort around the eyes.  Temperature was 99.3. His wife is a Marine scientist, per her job protocol she has been tested for COVID-19 today, results pending; she is asx     Review of Systems Denies runny nose, some mild sore throat for 1 day No chest pain no difficulty breathing No nausea, vomiting, diarrhea No cough No rash  Past Medical History:  Diagnosis Date  . Dyslipidemia     Past Surgical History:  Procedure Laterality Date  . KNEE ARTHROSCOPY Right 2009    Social History   Socioeconomic History  . Marital status: Married    Spouse name: Not on file  . Number of children: 1  . Years of education: Not on file  . Highest education level: Not on file  Occupational History  . Occupation: Production designer, theatre/television/film, Oceanographer  . Financial resource strain: Not on file  . Food insecurity    Worry: Not on file    Inability: Not on file  . Transportation needs    Medical: Not on file   Non-medical: Not on file  Tobacco Use  . Smoking status: Current Some Day Smoker  . Smokeless tobacco: Never Used  . Tobacco comment: quit ~ 08-2015 (smokes rarely )  Substance and Sexual Activity  . Alcohol use: Yes    Comment: socially  . Drug use: No  . Sexual activity: Not on file  Lifestyle  . Physical activity    Days per week: Not on file    Minutes per session: Not on file  . Stress: Not on file  Relationships  . Social Herbalist on phone: Not on file    Gets together: Not on file    Attends religious service: Not on file    Active member of club or organization: Not on file    Attends meetings of clubs or organizations: Not on file    Relationship status: Not on file  . Intimate partner violence    Fear of current or ex partner: Not on file    Emotionally abused: Not on file    Physically abused: Not on file    Forced sexual activity: Not on file  Other Topics Concern  . Not on file  Social History Narrative   Married    Household: pt, wife , child (2017)   Parents from [place]      Allergies as of 12/07/2018   No Known Allergies  Medication List       Accurate as of December 07, 2018 11:59 PM. If you have any questions, ask your nurse or doctor.        fenofibrate 160 MG tablet Take 1 tablet (160 mg total) by mouth daily.   MULTIVITAMIN PO Take 1 tablet by mouth daily.           Objective:   Physical Exam There were no vitals taken for this visit. This is a virtual video visit, patient is alert oriented x3, no apparent distress.  Last BMI on the chart 35     Assessment      Assessment Dyslipidemia , high TG L arm skin lesion, s/p Bx per derm before, no dx made, as off 8-17 decreasing in size per pt  Increase LFTs 2, hep B and C serology (-) 10/2016  PLAN: Viral syndrome: The patient is 38 years old, no history of diabetes or hypertension, BMI 35, presents with a viral syndrome, he looks well.  Did have a temperature of  101.0 yesterday. DDX include COVID-19.  I sent a message for him to be tested. I explained that we will have to assume for now that he is COVID positive. Recommend rest, fluids, Robitussin-DM, Tylenol, check his temperature twice daily ER if chest pain, difficulty breathing, high fever, rash, headache. He will stop being contagious in 2 weeks and with no symptoms and with no fever without Tylenol. Message with a summary of my recommendations sent. He verbalized understanding.    I discussed the assessment and treatment plan with the patient. The patient was provided an opportunity to ask questions and all were answered. The patient agreed with the plan and demonstrated an understanding of the instructions.   The patient was advised to call back or seek an in-person evaluation if the symptoms worsen or if the condition fails to improve as anticipated.

## 2018-12-07 NOTE — Telephone Encounter (Signed)
-----   Message from Colon Branch, MD sent at 12/07/2018 11:11 AM EDT ----- Regarding: needs Covis testing

## 2018-12-09 NOTE — Assessment & Plan Note (Signed)
Viral syndrome: The patient is 38 years old, no history of diabetes or hypertension, BMI 35, presents with a viral syndrome, he looks well.  Did have a temperature of 101.0 yesterday. DDX include COVID-19.  I sent a message for him to be tested. I explained that we will have to assume for now that he is COVID positive. Recommend rest, fluids, Robitussin-DM, Tylenol, check his temperature twice daily ER if chest pain, difficulty breathing, high fever, rash, headache. He will stop being contagious in 2 weeks and with no symptoms and with no fever without Tylenol. Message with a summary of my recommendations sent. He verbalized understanding.

## 2018-12-10 ENCOUNTER — Telehealth: Payer: Self-pay | Admitting: Internal Medicine

## 2018-12-10 NOTE — Telephone Encounter (Signed)
Recently seen with a viral syndrome, possibly COVID-19, please check on patient, is he feeling better? Also, sent him to be tested, I do not see the results.

## 2018-12-10 NOTE — Telephone Encounter (Signed)
Charles Diaz- I believe Pt speaks spanish- can you call and check on him? COVID test is pending.

## 2018-12-11 ENCOUNTER — Encounter: Payer: Self-pay | Admitting: Internal Medicine

## 2018-12-13 LAB — NOVEL CORONAVIRUS, NAA: SARS-CoV-2, NAA: DETECTED — AB

## 2018-12-14 ENCOUNTER — Telehealth: Payer: Self-pay

## 2018-12-14 NOTE — Telephone Encounter (Signed)
+  COVID 19 test today. Pt sent mychart message last night. LMOM asking for call back.

## 2018-12-14 NOTE — Telephone Encounter (Signed)
Pt scheduled w/ PCP on Monday to discuss.

## 2018-12-16 ENCOUNTER — Observation Stay (HOSPITAL_COMMUNITY)
Admission: EM | Admit: 2018-12-16 | Discharge: 2018-12-17 | Disposition: A | Discharge status: Home / Self Care | Payer: BC Managed Care – PPO | Attending: Family Medicine | Admitting: Family Medicine

## 2018-12-16 ENCOUNTER — Other Ambulatory Visit: Payer: Self-pay

## 2018-12-16 ENCOUNTER — Emergency Department (HOSPITAL_COMMUNITY): Payer: BC Managed Care – PPO

## 2018-12-16 ENCOUNTER — Encounter (HOSPITAL_COMMUNITY): Payer: Self-pay | Admitting: *Deleted

## 2018-12-16 DIAGNOSIS — R0602 Shortness of breath: Secondary | ICD-10-CM

## 2018-12-16 DIAGNOSIS — F17200 Nicotine dependence, unspecified, uncomplicated: Secondary | ICD-10-CM | POA: Insufficient documentation

## 2018-12-16 DIAGNOSIS — J069 Acute upper respiratory infection, unspecified: Secondary | ICD-10-CM

## 2018-12-16 DIAGNOSIS — U071 COVID-19: Secondary | ICD-10-CM | POA: Diagnosis not present

## 2018-12-16 DIAGNOSIS — R197 Diarrhea, unspecified: Secondary | ICD-10-CM | POA: Diagnosis not present

## 2018-12-16 DIAGNOSIS — R918 Other nonspecific abnormal finding of lung field: Secondary | ICD-10-CM | POA: Diagnosis not present

## 2018-12-16 DIAGNOSIS — R509 Fever, unspecified: Secondary | ICD-10-CM | POA: Diagnosis not present

## 2018-12-16 DIAGNOSIS — E785 Hyperlipidemia, unspecified: Secondary | ICD-10-CM | POA: Diagnosis not present

## 2018-12-16 DIAGNOSIS — A419 Sepsis, unspecified organism: Secondary | ICD-10-CM | POA: Diagnosis not present

## 2018-12-16 DIAGNOSIS — Z79899 Other long term (current) drug therapy: Secondary | ICD-10-CM | POA: Insufficient documentation

## 2018-12-16 DIAGNOSIS — N182 Chronic kidney disease, stage 2 (mild): Secondary | ICD-10-CM

## 2018-12-16 LAB — COMPREHENSIVE METABOLIC PANEL
ALT: 30 U/L (ref 0–44)
AST: 36 U/L (ref 15–41)
Albumin: 3.5 g/dL (ref 3.5–5.0)
Alkaline Phosphatase: 44 U/L (ref 38–126)
Anion gap: 8 (ref 5–15)
BUN: 9 mg/dL (ref 6–20)
CO2: 24 mmol/L (ref 22–32)
Calcium: 9 mg/dL (ref 8.9–10.3)
Chloride: 105 mmol/L (ref 98–111)
Creatinine, Ser: 1.3 mg/dL — ABNORMAL HIGH (ref 0.61–1.24)
GFR calc Af Amer: >60 mL/min (ref >60)
GFR calc non Af Amer: >60 mL/min (ref >60)
Glucose, Bld: 121 mg/dL — ABNORMAL HIGH (ref 70–99)
Potassium: 4 mmol/L (ref 3.5–5.1)
Sodium: 137 mmol/L (ref 135–145)
Total Bilirubin: 0.6 mg/dL (ref 0.3–1.2)
Total Protein: 6.8 g/dL (ref 6.5–8.1)

## 2018-12-16 LAB — CBC WITH DIFFERENTIAL/PLATELET
Abs Immature Granulocytes: 0.03 10*3/uL (ref 0.00–0.07)
Basophils Absolute: 0 10*3/uL (ref 0.0–0.1)
Basophils Relative: 0 %
Eosinophils Absolute: 0 10*3/uL (ref 0.0–0.5)
Eosinophils Relative: 0 %
HCT: 41 % (ref 39.0–52.0)
Hemoglobin: 14.3 g/dL (ref 13.0–17.0)
Immature Granulocytes: 0 %
Lymphocytes Relative: 20 %
Lymphs Abs: 1.3 10*3/uL (ref 0.7–4.0)
MCH: 28.8 pg (ref 26.0–34.0)
MCHC: 34.9 g/dL (ref 30.0–36.0)
MCV: 82.5 fL (ref 80.0–100.0)
Monocytes Absolute: 0.4 10*3/uL (ref 0.1–1.0)
Monocytes Relative: 6 %
Neutro Abs: 5 10*3/uL (ref 1.7–7.7)
Neutrophils Relative %: 74 %
Platelets: 167 10*3/uL (ref 150–400)
RBC: 4.97 MIL/uL (ref 4.22–5.81)
RDW: 12.4 % (ref 11.5–15.5)
WBC: 6.8 10*3/uL (ref 4.0–10.5)
nRBC: 0 % (ref 0.0–0.2)

## 2018-12-16 LAB — FERRITIN: Ferritin: 450 ng/mL — ABNORMAL HIGH (ref 24–336)

## 2018-12-16 LAB — D-DIMER, QUANTITATIVE: D-Dimer, Quant: 0.63 ug/mL-FEU — ABNORMAL HIGH (ref 0.00–0.50)

## 2018-12-16 LAB — FIBRINOGEN: Fibrinogen: 620 mg/dL — ABNORMAL HIGH (ref 210–475)

## 2018-12-16 LAB — LACTATE DEHYDROGENASE: LDH: 237 U/L — ABNORMAL HIGH (ref 98–192)

## 2018-12-16 LAB — BRAIN NATRIURETIC PEPTIDE: B Natriuretic Peptide: 13.1 pg/mL (ref 0.0–100.0)

## 2018-12-16 MED ORDER — ONDANSETRON HCL 4 MG PO TABS: 4.0000 mg | ORAL_TABLET | Freq: Four times a day (QID) | ORAL | Status: DC | PRN

## 2018-12-16 MED ORDER — SODIUM CHLORIDE 0.9 % IV BOLUS
1000.0000 mL | Freq: Once | INTRAVENOUS | Status: AC
  Administered 2018-12-16: 1000 mL via INTRAVENOUS

## 2018-12-16 MED ORDER — DM-GUAIFENESIN ER 30-600 MG PO TB12: 1.0000 | ORAL_TABLET | Freq: Two times a day (BID) | ORAL | Status: DC | PRN

## 2018-12-16 MED ORDER — VITAMIN C 500 MG PO TABS
500.0000 mg | ORAL_TABLET | Freq: Every day | ORAL | Status: DC
  Administered 2018-12-17: 500 mg via ORAL
  Filled 2018-12-16: qty 1

## 2018-12-16 MED ORDER — ENOXAPARIN SODIUM 40 MG/0.4ML ~~LOC~~ SOLN
40.0000 mg | SUBCUTANEOUS | Status: DC
  Filled 2018-12-16: qty 0.4

## 2018-12-16 MED ORDER — LEVALBUTEROL TARTRATE 45 MCG/ACT IN AERO: 2.0000 | INHALATION_SPRAY | Freq: Four times a day (QID) | RESPIRATORY_TRACT | Status: DC | PRN

## 2018-12-16 MED ORDER — ZINC SULFATE 220 (50 ZN) MG PO CAPS
220.0000 mg | ORAL_CAPSULE | Freq: Every day | ORAL | Status: DC
  Administered 2018-12-17: 220 mg via ORAL
  Filled 2018-12-16: qty 1

## 2018-12-16 MED ORDER — METHYLPREDNISOLONE SODIUM SUCC 125 MG IJ SOLR
60.0000 mg | Freq: Two times a day (BID) | INTRAMUSCULAR | Status: DC
  Administered 2018-12-17 (×2): 60 mg via INTRAVENOUS
  Filled 2018-12-16 (×2): qty 2

## 2018-12-16 MED ORDER — ADULT MULTIVITAMIN W/MINERALS CH
1.0000 | ORAL_TABLET | Freq: Every day | ORAL | Status: DC
  Administered 2018-12-17: 1 via ORAL
  Filled 2018-12-16: qty 1

## 2018-12-16 MED ORDER — FENOFIBRATE 160 MG PO TABS
160.0000 mg | ORAL_TABLET | Freq: Every day | ORAL | Status: DC
  Administered 2018-12-17: 160 mg via ORAL
  Filled 2018-12-16: qty 1

## 2018-12-16 MED ORDER — ACETAMINOPHEN 325 MG PO TABS
650.0000 mg | ORAL_TABLET | Freq: Once | ORAL | Status: AC
  Administered 2018-12-16: 650 mg via ORAL
  Filled 2018-12-16: qty 2

## 2018-12-16 MED ORDER — ONDANSETRON HCL 4 MG/2ML IJ SOLN: 4.0000 mg | Freq: Four times a day (QID) | INTRAMUSCULAR | Status: DC | PRN

## 2018-12-16 MED ORDER — NICOTINE 21 MG/24HR TD PT24
21.0000 mg | MEDICATED_PATCH | Freq: Every day | TRANSDERMAL | Status: DC
  Filled 2018-12-16: qty 1

## 2018-12-16 MED ORDER — ALBUTEROL SULFATE HFA 108 (90 BASE) MCG/ACT IN AERS
2.0000 | INHALATION_SPRAY | Freq: Once | RESPIRATORY_TRACT | Status: AC
  Administered 2018-12-16: 2 via RESPIRATORY_TRACT
  Filled 2018-12-16: qty 6.7

## 2018-12-16 MED ORDER — IPRATROPIUM BROMIDE HFA 17 MCG/ACT IN AERS
2.0000 | INHALATION_SPRAY | RESPIRATORY_TRACT | Status: DC
  Administered 2018-12-17 (×2): 2 via RESPIRATORY_TRACT
  Filled 2018-12-16: qty 12.9

## 2018-12-16 MED ORDER — ACETAMINOPHEN 325 MG PO TABS
650.0000 mg | ORAL_TABLET | Freq: Four times a day (QID) | ORAL | Status: DC | PRN
  Administered 2018-12-17: 650 mg via ORAL
  Filled 2018-12-16: qty 2

## 2018-12-16 NOTE — H&P (Signed)
History and Physical    Charles CruzRudy A Meskill ZOX:[id number] DOB: 04-21-1981 DOA: 12/16/2018  Referring MD/NP/PA:   PCP: Wanda PlumpPaz, Jose E, MD   Patient coming from:  The patient is coming from home.  At baseline, pt is independent for most of ADL.        Chief Complaint: Shortness of breath, fever and chills  HPI: Charles Diaz is a 38 y.o. male with medical history significant of hyperlipidemia, tobacco abuse, CKD 2, who presents with shortness of breath, fever and chills.  Patient states that he was tested positive for COVID-19 by PCP on 12/07/2018.  He has been at home monitoring his symptoms. In the past several days, his shortness breath has been progressively worsening.  He also has some dry cough.  He has fever of 103 at home today, body aches and chills.  Denies chest pain. He has loss in taste and smell. He has mild diarrhea in the past several days, 1-2 times each day.  No nausea vomiting or abdominal pain.  No symptoms of UTI or unilateral weakness.  ED Course: pt was found to have WBC 6.8, BNP 13.1, slightly worsening renal function, d-dimer 0.63, LDH 237, ferritin 450, fibrinogen 620, normal liver function.  Temperature 102.4, tachycardia, oxygen desaturation to 92% on ambulation, 95% at rest, blood pressure 112/73, tachycardia.  Chest x-ray showed bilateral groundglass infiltration.  Patient is placed on telemetry bed for observation.  Review of Systems:   General: has fevers, chills, no body weight gain, has poor appetite, has fatigue HEENT: no blurry vision, hearing changes or sore throat Respiratory: has dyspnea, coughing, no wheezing CV: no chest pain, no palpitations GI: no nausea, vomiting, abdominal pain, has diarrhea, no constipation GU: no dysuria, burning on urination, increased urinary frequency, hematuria  Ext: no leg edema Neuro: no unilateral weakness, numbness, or tingling, no vision change or hearing loss Skin: no rash, no skin tear. MSK: No muscle spasm, no  deformity, no limitation of range of movement in spin Heme: No easy bruising.  Travel history: No recent long distant travel.  Allergy: No Known Allergies  Past Medical History:  Diagnosis Date  . CKD (chronic kidney disease) stage 2, GFR 60-89 ml/min   . Dyslipidemia     Past Surgical History:  Procedure Laterality Date  . KNEE ARTHROSCOPY Right 2009    Social History:  reports that he has been smoking. He has never used smokeless tobacco. He reports current alcohol use. He reports that he does not use drugs.  Family History:  Family History  Problem Relation Age of Onset  . Diabetes Other        GM x2  . Hypertension Father   . Cancer Other        uncle, stomach ca?  . Colon cancer Neg Hx   . Prostate cancer Neg Hx   . CAD Neg Hx      Prior to Admission medications   Medication Sig Start Date End Date Taking? Authorizing Provider  fenofibrate 160 MG tablet Take 1 tablet (160 mg total) by mouth daily. 05/21/18   Wanda PlumpPaz, Jose E, MD  Multiple Vitamins-Minerals (MULTIVITAMIN PO) Take 1 tablet by mouth daily.    [provider]    Physical Exam: Vitals:   12/16/18 2345 12/17/18 0000 12/17/18 0015 12/17/18 0027  BP: 119/79 108/73 120/85   Pulse: 96     Resp:      Temp:    (!) 101.4 F (38.6 C)  TempSrc:  Oral  SpO2: 100%     Weight:      Height:       General: Not in acute distress HEENT:       Eyes: PERRL, EOMI, no scleral icterus.       ENT: No discharge from the ears and nose, no pharynx injection, no tonsillar enlargement.        Neck: No JVD, no bruit, no mass felt. Heme: No neck lymph node enlargement. Cardiac: S1/S2, RRR, No murmurs, No gallops or rubs. Respiratory: No rales, wheezing, rhonchi or rubs. GI: Soft, nondistended, nontender, no rebound pain, no organomegaly, BS present. GU: No hematuria Ext: No pitting leg edema bilaterally. 2+DP/PT pulse bilaterally. Musculoskeletal: No joint deformities, No joint redness or warmth, no limitation of  ROM in spin. Skin: No rashes.  Neuro: Alert, oriented X3, cranial nerves II-XII grossly intact, moves all extremities normally.  Psych: Patient is not psychotic, no suicidal or hemocidal ideation.  Labs on Admission: I have personally reviewed following labs and imaging studies  CBC: Recent Labs  Lab 12/16/18 1741  WBC 6.8  NEUTROABS 5.0  HGB 14.3  HCT 41.0  MCV 82.5  PLT 167   Basic Metabolic Panel: Recent Labs  Lab 12/16/18 1741 12/17/18 0002  NA 137  --   K 4.0  --   CL 105  --   CO2 24  --   GLUCOSE 121*  --   BUN 9  --   CREATININE 1.30* 1.20  CALCIUM 9.0  --    GFR: Estimated Creatinine Clearance: 101.8 mL/min (by C-G formula based on SCr of 1.2 mg/dL). Liver Function Tests: Recent Labs  Lab 12/16/18 1741  AST 36  ALT 30  ALKPHOS 44  BILITOT 0.6  PROT 6.8  ALBUMIN 3.5   No results for input(s): LIPASE, AMYLASE in the last 168 hours. No results for input(s): AMMONIA in the last 168 hours. Coagulation Profile: No results for input(s): INR, PROTIME in the last 168 hours. Cardiac Enzymes: No results for input(s): CKTOTAL, CKMB, CKMBINDEX, TROPONINI in the last 168 hours. BNP (last 3 results) No results for input(s): PROBNP in the last 8760 hours. HbA1C: No results for input(s): HGBA1C in the last 72 hours. CBG: No results for input(s): GLUCAP in the last 168 hours. Lipid Profile: Recent Labs    12/17/18 0002  TRIG 122   Thyroid Function Tests: No results for input(s): TSH, T4TOTAL, FREET4, T3FREE, THYROIDAB in the last 72 hours. Anemia Panel: Recent Labs    12/16/18 1941  FERRITIN 450*   Urine analysis: No results found for: COLORURINE, APPEARANCEUR, LABSPEC, PHURINE, GLUCOSEU, HGBUR, BILIRUBINUR, KETONESUR, PROTEINUR, UROBILINOGEN, NITRITE, LEUKOCYTESUR Sepsis Labs: @LABRCNTIP (procalcitonin:4,lacticidven:4) ) Recent Results (from the past 240 hour(s))  Novel Coronavirus, NAA ([facility])     Status: Abnormal   Collection Time: 12/07/18  11:56 AM  Result Value Ref Range Status   SARS-CoV-2, NAA Detected (A) Not Detected Final    Comment: This test was developed and its performance characteristics determined by [facility]. This test has not been FDA cleared or approved. This test has been authorized by FDA under an Emergency Use Authorization (EUA). This test is only authorized for the duration of time the declaration that circumstances exist justifying the authorization of the emergency use of in vitro diagnostic tests for detection of SARS-CoV-2 virus and/or diagnosis of COVID-19 infection under section 564(b)(1) of the Act, 21 U.S.C. 161WRU-0(A)(5360bbb-3(b)(1), unless the authorization is terminated or revoked sooner. When diagnostic testing is negative, the possibility of  a false negative result should be considered in the context of a patient's recent exposures and the presence of clinical signs and symptoms consistent with COVID-19. An individual without symptoms of COVID-19 and who is not shedding SARS-CoV-2 virus would expect to have a negative (not detected) result in this assay.      Radiological Exams on Admission: Dg Chest Portable 1 View  Result Date: 12/16/2018 CLINICAL DATA:  Shortness of breath, COVID-19 EXAM: PORTABLE CHEST 1 VIEW COMPARISON:  12/16/2018 FINDINGS: The heart size and mediastinal contours are within normal limits. Extensive patchy heterogeneous and ground-glass pulmonary opacity bilaterally. The visualized skeletal structures are unremarkable. IMPRESSION: Extensive patchy heterogeneous and ground-glass pulmonary opacity bilaterally, consistent with reported diagnosis of COVID-19. Electronically Signed   By: Eddie Candle M.D.   On: 12/16/2018 18:07     EKG:  Not done in ED. Will get one.  Assessment/Plan Principal Problem:   Acute respiratory disease due to COVID-19 virus Active Problems:   Dyslipidemia   CKD (chronic kidney disease) stage 2, GFR 60-89 ml/min   Sepsis ([place])   Acute  respiratory disease due to COVID-19 virus and sepsis: Chest x-ray showed a bilateral groundglass infiltration.  No leukocytosis.  Oxygen desaturation to 92% on ambulation, but 95% at rest.  Currently hemodynamically stable.  Patient meets critical for sepsis with fever and tachycardia.  -will place on tele bed for obs - discussed with pharm about Remdesivir --> they will kindly let MD in [place] know to start Remdesivir -Solumedrol 60 mg bid -vitamin C, zinc.   -Atrovent inhaler, PRN Xopenex inhaler -PRN Mucinex for cough -Follow-up flu PCR and RVP -f/u Blood culture -D-dimer, BNP,Trop, LFT, CRP, LDH, Procalcitonin, IL-6, ferritin, fibinogen, TG, Hep B SAg, HIV ab -Daily CRP, Ferritin, D-dimer, -Will ask the patient to maintain an awake prone position for 16+ hours a day, if possible, with a minimum of 2-3 hours at a time -Will attempt to maintain euvolemia to a net negative fluid status -Patient was seen wearing full PPE including: gown, gloves, head cover, N95 -f/u procalcitonin and trend lactic acid level.  Will not start IV fluids and unless lactic acid is significantly elevated. -IF patient deteriorates, will consult PCCM and ID  Dyslipidemia: -Fenofibrate  CKD (chronic kidney disease) stage 2, GFR 60-89 ml/min: Close to baseline.  Baseline creatinine 1.0-1.2.  His creatinine is 1.30, BUN 9. -Follow-up BMP    DVT ppx: SQ Lovenox Code Status: Full code Family Communication: None at bed side.    Disposition Plan:  Anticipate discharge back to previous home environment Consults called:  none Admission status: Obs / tele   Date of Service 12/17/2018    [facility]   If 7PM-7AM, please contact night-coverage [email or web address] Password [email or web address] 12/17/2018, 1:33 AM

## 2018-12-16 NOTE — ED Provider Notes (Signed)
Charles Diaz Provider Note   CSN: [id number] Arrival date & time: 12/16/18  1730    History   Chief Complaint Chief Complaint  Patient presents with  . Fever    HPI LADARIOUS Diaz is a 38 y.o. male.     38 y.o male with a PMH of Dyslipidemia presents to the ED with a chief complaint of shortness of breath x 3 days. Patient was diagnosed with Covid 19 about 9 days ago by his PCP.  Patient has been at home monitoring his symptoms, he reports increasing in shortness of breath, states this is worse at rest and ambulation.  He also reports a T-max at home of 103, has been taken Tylenol for your symptoms without improvement.  Patient is wife is also in the emergency [facility] at this time with shortness of breath, she is an endoscopy RN diagnosed around the same time.  Patient also reports chills, body aches, loss in taste and smell.  Also reports a dry cough.  He denies any chest pain, abdominal pain at this time but has endorsed some episodes of diarrhea within the past week.  The history is provided by the patient, medical records and a relative.    Past Medical History:  Diagnosis Date  . Dyslipidemia     Patient Active Problem List   Diagnosis Date Noted  . PCP NOTES >>>>>>>>>>>>>>> 02/14/2016  . Annual physical exam 01/30/2013  . Skin lesion 01/30/2013    Past Surgical History:  Procedure Laterality Date  . KNEE ARTHROSCOPY Right 2009        Home Medications    Prior to Admission medications   Medication Sig Start Date End Date Taking? Authorizing Provider  fenofibrate 160 MG tablet Take 1 tablet (160 mg total) by mouth daily. 05/21/18   Colon Branch, MD  Multiple Vitamins-Minerals (MULTIVITAMIN PO) Take 1 tablet by mouth daily.    [provider]    Family History Family History  Problem Relation Age of Onset  . Diabetes Other        GM x2  . Hypertension Father   . Cancer Other        uncle, stomach ca?  .  Colon cancer Neg Hx   . Prostate cancer Neg Hx   . CAD Neg Hx     Social History Social History   Tobacco Use  . Smoking status: Current Some Day Smoker  . Smokeless tobacco: Never Used  . Tobacco comment: quit ~ 08-2015 (smokes rarely )  Substance Use Topics  . Alcohol use: Yes    Comment: socially  . Drug use: No     Allergies   Patient has no known allergies.   Review of Systems Review of Systems  Constitutional: Positive for chills and fever.  HENT: Negative for ear pain and sore throat.   Eyes: Negative for pain and visual disturbance.  Respiratory: Positive for shortness of breath. Negative for cough.   Cardiovascular: Negative for chest pain and palpitations.  Gastrointestinal: Positive for diarrhea. Negative for abdominal pain and vomiting.  Genitourinary: Negative for dysuria and hematuria.  Musculoskeletal: Negative for arthralgias and back pain.  Skin: Negative for color change and rash.  Neurological: Negative for seizures and syncope.  All other systems reviewed and are negative.    Physical Exam Updated Vital Signs BP 112/73   Pulse 97   Temp (!) 102.4 F (39.1 C) (Oral)   Resp 18   Ht 5\' 9"  (1.753  m)   Wt 107.5 kg   SpO2 99%   BMI 35.00 kg/m   Physical Exam Vitals signs and nursing note reviewed.  Constitutional:      Appearance: He is well-developed.     Comments: Ill-appearing male.  HENT:     Head: Normocephalic and atraumatic.     Mouth/Throat:     Mouth: Mucous membranes are dry.  Eyes:     General: No scleral icterus.    Pupils: Pupils are equal, round, and reactive to light.  Neck:     Musculoskeletal: Normal range of motion.  Cardiovascular:     Heart sounds: Normal heart sounds.     Comments: No swelling noted to bilateral legs. Pulmonary:     Effort: Pulmonary effort is normal.     Breath sounds: Rhonchi present. No wheezing.     Comments: Lung sounds are diminished to auscultation. Chest:     Chest wall: No tenderness.   Abdominal:     General: Bowel sounds are normal. There is no distension.     Palpations: Abdomen is soft.     Tenderness: There is no abdominal tenderness.  Musculoskeletal:        General: No tenderness or deformity.  Skin:    General: Skin is warm and dry.  Neurological:     Mental Status: He is alert and oriented to person, place, and time.      ED Treatments / Results  Labs (all labs ordered are listed, but only abnormal results are displayed) Labs Reviewed  COMPREHENSIVE METABOLIC PANEL - Abnormal; Notable for the following components:      Result Value   Glucose, Bld 121 (*)    Creatinine, Ser 1.30 (*)    All other components within normal limits  D-DIMER, QUANTITATIVE (NOT AT [facility]) - Abnormal; Notable for the following components:   D-Dimer, Quant 0.63 (*)    All other components within normal limits  FERRITIN - Abnormal; Notable for the following components:   Ferritin 450 (*)    All other components within normal limits  FIBRINOGEN - Abnormal; Notable for the following components:   Fibrinogen 620 (*)    All other components within normal limits  LACTATE DEHYDROGENASE - Abnormal; Notable for the following components:   LDH 237 (*)    All other components within normal limits  CULTURE, BLOOD (ROUTINE X 2)  CULTURE, BLOOD (ROUTINE X 2)  CBC WITH DIFFERENTIAL/PLATELET  BRAIN NATRIURETIC PEPTIDE  HIV ANTIBODY (ROUTINE TESTING W REFLEX)    EKG None  Radiology Dg Chest Portable 1 View  Result Date: 12/16/2018 CLINICAL DATA:  Shortness of breath, COVID-19 EXAM: PORTABLE CHEST 1 VIEW COMPARISON:  12/16/2018 FINDINGS: The heart size and mediastinal contours are within normal limits. Extensive patchy heterogeneous and ground-glass pulmonary opacity bilaterally. The visualized skeletal structures are unremarkable. IMPRESSION: Extensive patchy heterogeneous and ground-glass pulmonary opacity bilaterally, consistent with reported diagnosis of COVID-19. Electronically  Signed   By: Lauralyn PrimesAlex  Bibbey M.D.   On: 12/16/2018 18:07    Procedures Procedures (including critical care time)  Medications Ordered in ED Medications  acetaminophen (TYLENOL) tablet 650 mg (650 mg Oral Given 12/16/18 1805)  sodium chloride 0.9 % bolus 1,000 mL (1,000 mLs Intravenous New Bag/Given 12/16/18 1942)  albuterol (VENTOLIN HFA) 108 (90 Base) MCG/ACT inhaler 2 puff (2 puffs Inhalation Given 12/16/18 2140)     Initial Impression / Assessment and Plan / ED Course  I have reviewed the triage vital signs and the nursing notes.  Pertinent  labs & imaging results that were available during my care of the patient were reviewed by me and considered in my medical decision making (see chart for details).    Patient with no PMH presents to the ED with a chief complaint of shortness of breath x 1 week.  Patient tested positive for COVID-19 about a week ago, he reports he has been feeling more fatigue, has had a T-max of 102, has lost smell and taste.  Patient is currently living with wife who is an endoscopy nurse at [employer] [facility]. Patient arrived in the ED with a heart rate of 112, temperature of 102, satting at 95%.  Patient looks dry, ill-appearing.  Patient was placed on 2 L of O2 which brought his oxygen to 98%.  He was given Tylenol for his fever along with a liter of fluids.  Will order labs, chest x-ray to further evaluate.   Patient's fever improved from 1-2.4 after Tylenol to 99.5 I have personally recorded this myself.  CBC showed no leukocytosis, hemoglobin is within normal limits.  Electrolytes on CMP were unremarkable.  Slight elevation in creatinine at 1.36.  Ferritin to level is elevated, fibrinogen level slightly elevated.  D-dimer is positive.  LDH is elevated.  Patient was ambulated by me while in the room, with oxygen he stats around 93 to 95%.  Heart rate has improved after a liter of fluids now 95. X-ray of his chest showed Extensive patchy heterogeneous and ground-glass  pulmonary opacity  bilaterally, consistent with reported diagnosis of COVID-19.       Patient has received a liter of fluids, albuterol therapy, Tylenol for his fever.  Due to patient's new oxygen requirement I believe patient would benefit from admission into the hospital.  Will place call for hospitalist at this time.  10:32 PM Spoke to Hospitalist Dr. Clyde LundborgNiu who will admit and further evaluate patient.   Portions of this note were generated with Scientist, clinical (histocompatibility and immunogenetics)Dragon dictation software. Dictation errors may occur despite best attempts at proofreading.     Final Clinical Impressions(s) / ED Diagnoses   Final diagnoses:  COVID-19 virus detected  Shortness of breath  Fever, unspecified fever cause    ED Discharge Orders    None       Claude MangesSoto, Mira Balon, Cordelia Poche-C 12/16/18 2232    Virgina Norfolkuratolo, Adam, DO 12/16/18 2244

## 2018-12-16 NOTE — ED Notes (Signed)
Pt ambulated in room using pulse ox. Maintaining between 94%-96% on room air. Pt down to 92% on return to bed. [place] @ 2L replaced and oxygen back up to 96%.

## 2018-12-16 NOTE — ED Triage Notes (Addendum)
Pt reports Sx's  Started one week ago.  Pt reports fever and fatigue. Pt lost smell and taste starting one week ago. Pt reports no other family members are sick. Pt reports his wife both are sick.

## 2018-12-17 ENCOUNTER — Other Ambulatory Visit: Payer: Self-pay

## 2018-12-17 ENCOUNTER — Ambulatory Visit: Payer: BC Managed Care – PPO | Admitting: Internal Medicine

## 2018-12-17 DIAGNOSIS — J069 Acute upper respiratory infection, unspecified: Secondary | ICD-10-CM | POA: Diagnosis not present

## 2018-12-17 DIAGNOSIS — Z79899 Other long term (current) drug therapy: Secondary | ICD-10-CM | POA: Diagnosis not present

## 2018-12-17 DIAGNOSIS — E785 Hyperlipidemia, unspecified: Secondary | ICD-10-CM | POA: Diagnosis not present

## 2018-12-17 DIAGNOSIS — R0689 Other abnormalities of breathing: Secondary | ICD-10-CM | POA: Diagnosis not present

## 2018-12-17 DIAGNOSIS — A419 Sepsis, unspecified organism: Secondary | ICD-10-CM | POA: Diagnosis not present

## 2018-12-17 DIAGNOSIS — U071 COVID-19: Secondary | ICD-10-CM | POA: Diagnosis not present

## 2018-12-17 DIAGNOSIS — N182 Chronic kidney disease, stage 2 (mild): Secondary | ICD-10-CM | POA: Diagnosis not present

## 2018-12-17 DIAGNOSIS — R509 Fever, unspecified: Secondary | ICD-10-CM | POA: Diagnosis not present

## 2018-12-17 DIAGNOSIS — F17200 Nicotine dependence, unspecified, uncomplicated: Secondary | ICD-10-CM | POA: Diagnosis not present

## 2018-12-17 DIAGNOSIS — R279 Unspecified lack of coordination: Secondary | ICD-10-CM | POA: Diagnosis not present

## 2018-12-17 DIAGNOSIS — R0602 Shortness of breath: Secondary | ICD-10-CM | POA: Diagnosis not present

## 2018-12-17 DIAGNOSIS — R197 Diarrhea, unspecified: Secondary | ICD-10-CM | POA: Diagnosis not present

## 2018-12-17 DIAGNOSIS — R918 Other nonspecific abnormal finding of lung field: Secondary | ICD-10-CM | POA: Diagnosis not present

## 2018-12-17 DIAGNOSIS — Z743 Need for continuous supervision: Secondary | ICD-10-CM | POA: Diagnosis not present

## 2018-12-17 LAB — LACTIC ACID, PLASMA: Lactic Acid, Venous: 1.5 mmol/L (ref 0.5–1.9)

## 2018-12-17 LAB — CREATININE, SERUM
Creatinine, Ser: 1.2 mg/dL (ref 0.61–1.24)
GFR calc Af Amer: >60 mL/min (ref >60)
GFR calc non Af Amer: >60 mL/min (ref >60)

## 2018-12-17 LAB — TYPE AND SCREEN
ABO/RH(D): A POS
Antibody Screen: NEGATIVE

## 2018-12-17 LAB — SEDIMENTATION RATE: Sed Rate: 25 mm/hr — ABNORMAL HIGH (ref 0–16)

## 2018-12-17 LAB — TROPONIN I (HIGH SENSITIVITY): Troponin I (High Sensitivity): 4 ng/L (ref <18)

## 2018-12-17 LAB — HIV ANTIBODY (ROUTINE TESTING W REFLEX): HIV Screen 4th Generation wRfx: NONREACTIVE

## 2018-12-17 LAB — ABO/RH: ABO/RH(D): A POS

## 2018-12-17 LAB — PROCALCITONIN: Procalcitonin: <0.1 ng/mL

## 2018-12-17 LAB — C-REACTIVE PROTEIN: CRP: 14.1 mg/dL — ABNORMAL HIGH (ref <1.0)

## 2018-12-17 LAB — TRIGLYCERIDES: Triglycerides: 122 mg/dL (ref <150)

## 2018-12-17 MED ORDER — PREDNISONE 20 MG PO TABS: 40.0000 mg | ORAL_TABLET | Freq: Every day | ORAL | 0 refills | Status: DC

## 2018-12-17 NOTE — ED Notes (Signed)
ED TO INPATIENT HANDOFF REPORT  ED Nurse Name and Phone #:  Chong Sicilian [phone]  S Name/Age/Gender Charles Diaz 38 y.o. male Room/Bed: 010C/010C  Code Status   Code Status: Full Code  Home/SNF/Other Home Patient oriented to: self, place, time and situation Is this baseline? Yes   Triage Complete: Triage complete  Chief Complaint covid+, fever,sob  Triage Note Pt reports Sx's  Started one week ago.  Pt reports fever and fatigue. Pt lost smell and taste starting one week ago. Pt reports no other family members are sick. Pt reports his wife both are sick.   Allergies No Known Allergies  Level of Care/Admitting Diagnosis ED Disposition    ED Disposition Condition [facility] Area: [place] [[id number]]  Level of Care: Telemetry [5]  Covid Evaluation: Confirmed COVID Positive  Isolation Risk Level: Low Risk/Droplet (Less than 4L [place] supplementation)  Diagnosis: Acute respiratory disease due to COVID-19 virus [[id number]]  Admitting Physician: Ivor Costa [[id number]]  Attending Physician: Ivor Costa [[id number]]  PT Class (Do Not Modify): Observation [104]  PT Acc Code (Do Not Modify): Observation [10022]       B Medical/Surgery History Past Medical History:  Diagnosis Date  . CKD (chronic kidney disease) stage 2, GFR 60-89 ml/min   . Dyslipidemia    Past Surgical History:  Procedure Laterality Date  . KNEE ARTHROSCOPY Right 2009     A IV Location/Drains/Wounds Patient Lines/Drains/Airways Status   Active Line/Drains/Airways    Name:   Placement date:   Placement time:   Site:   Days:   Peripheral IV 12/16/18 Left;Upper Forearm   12/16/18    1940    Forearm   1   Peripheral IV 12/16/18 Right Antecubital   12/16/18    1923    Antecubital   1          Intake/Output Last 24 hours  Intake/Output Summary (Last 24 hours) at 12/17/2018 0036 Last data filed at 12/17/2018 0030 Gross per 24 hour  Intake 1000 ml  Output -  Net 1000 ml     Labs/Imaging Results for orders placed or performed during the hospital encounter of 12/16/18 (from the past 48 hour(s))  CBC with Differential     Status: None   Collection Time: 12/16/18  5:41 PM  Result Value Ref Range   WBC 6.8 4.0 - 10.5 K/uL   RBC 4.97 4.22 - 5.81 MIL/uL   Hemoglobin 14.3 13.0 - 17.0 g/dL   HCT 41.0 39.0 - 52.0 %   MCV 82.5 80.0 - 100.0 fL   MCH 28.8 26.0 - 34.0 pg   MCHC 34.9 30.0 - 36.0 g/dL   RDW 12.4 11.5 - 15.5 %   Platelets 167 150 - 400 K/uL   nRBC 0.0 0.0 - 0.2 %   Neutrophils Relative % 74 %   Neutro Abs 5.0 1.7 - 7.7 K/uL   Lymphocytes Relative 20 %   Lymphs Abs 1.3 0.7 - 4.0 K/uL   Monocytes Relative 6 %   Monocytes Absolute 0.4 0.1 - 1.0 K/uL   Eosinophils Relative 0 %   Eosinophils Absolute 0.0 0.0 - 0.5 K/uL   Basophils Relative 0 %   Basophils Absolute 0.0 0.0 - 0.1 K/uL   Immature Granulocytes 0 %   Abs Immature Granulocytes 0.03 0.00 - 0.07 K/uL    Comment: Performed at [facility], [address] [place], [place] [address]  Comprehensive metabolic panel  Status: Abnormal   Collection Time: 12/16/18  5:41 PM  Result Value Ref Range   Sodium 137 135 - 145 mmol/L   Potassium 4.0 3.5 - 5.1 mmol/L   Chloride 105 98 - 111 mmol/L   CO2 24 22 - 32 mmol/L   Glucose, Bld 121 (H) 70 - 99 mg/dL   BUN 9 6 - 20 mg/dL   Creatinine, Ser 5.401.30 (H) 0.61 - 1.24 mg/dL   Calcium 9.0 8.9 - 98.110.3 mg/dL   Total Protein 6.8 6.5 - 8.1 g/dL   Albumin 3.5 3.5 - 5.0 g/dL   AST 36 15 - 41 U/L   ALT 30 0 - 44 U/L   Alkaline Phosphatase 44 38 - 126 U/L   Total Bilirubin 0.6 0.3 - 1.2 mg/dL   GFR calc non Af Amer >60 >60 mL/min   GFR calc Af Amer >60 >60 mL/min   Anion gap 8 5 - 15    Comment: Performed at [facility], [address] [place], [place] [address]  Brain natriuretic peptide     Status: None   Collection Time: 12/16/18  6:54 PM  Result Value Ref Range   B Natriuretic Peptide 13.1 0.0 - 100.0 pg/mL    Comment: Performed at  [facility], [address] [place], [place] [address]  D-dimer, quantitative (not at [facility])     Status: Abnormal   Collection Time: 12/16/18  7:41 PM  Result Value Ref Range   D-Dimer, Quant 0.63 (H) 0.00 - 0.50 ug/mL-FEU    Comment: (NOTE) At the manufacturer cut-off of 0.50 ug/mL FEU, this assay has been documented to exclude PE with a sensitivity and negative predictive value of 97 to 99%.  At this time, this assay has not been approved by the FDA to exclude DVT/VTE. Results should be correlated with clinical presentation. Performed at [facility], [address] [place], [place] [address]   Ferritin     Status: Abnormal   Collection Time: 12/16/18  7:41 PM  Result Value Ref Range   Ferritin 450 (H) 24 - 336 ng/mL    Comment: Performed at [facility], [address] [place], [place] [address]  Fibrinogen     Status: Abnormal   Collection Time: 12/16/18  7:41 PM  Result Value Ref Range   Fibrinogen 620 (H) 210 - 475 mg/dL    Comment: Performed at [facility], [address] [place], [place] [address]  Lactate dehydrogenase     Status: Abnormal   Collection Time: 12/16/18  7:41 PM  Result Value Ref Range   LDH 237 (H) 98 - 192 U/L    Comment: Performed at [facility], [address] [place], [place] [address]   Dg Chest Portable 1 View  Result Date: 12/16/2018 CLINICAL DATA:  Shortness of breath, COVID-19 EXAM: PORTABLE CHEST 1 VIEW COMPARISON:  12/16/2018 FINDINGS: The heart size and mediastinal contours are within normal limits. Extensive patchy heterogeneous and ground-glass pulmonary opacity bilaterally. The visualized skeletal structures are unremarkable. IMPRESSION: Extensive patchy heterogeneous and ground-glass pulmonary opacity bilaterally, consistent with reported diagnosis of COVID-19. Electronically Signed   By: Lauralyn PrimesAlex  Bibbey M.D.   On: 12/16/2018 18:07    Pending Labs Unresulted Labs (From admission, onward)    Start      Ordered   12/17/18 0500  Comprehensive metabolic panel  Daily,   R     12/16/18 2311   12/17/18 0500  C-reactive  protein  Daily,   R     12/16/18 2311   12/17/18 0500  D-dimer, quantitative (not at [facility])  Daily,   R     12/16/18 2311   12/17/18 0500  Ferritin  Daily,   R     12/16/18 2311   12/17/18 0500  Triglycerides  Daily,   R     12/16/18 2311   12/17/18 0500  CBC with Differential/Platelet  Daily,   R     12/16/18 2311   12/17/18 0500  Interleukin-6, Plasma  Daily,   R     12/16/18 2311   12/16/18 2312  Strep pneumoniae urinary antigen  Once,   STAT     12/16/18 2311   12/16/18 2312  Lactic acid, plasma  STAT Now then every 3 hours,   R (with STAT occurrences)     12/16/18 2312   12/16/18 2311  Legionella Pneumophila Serogp 1 Ur Ag  Once,   STAT     12/16/18 2311   12/16/18 2310  C-reactive protein  Once,   STAT     12/16/18 2311   12/16/18 2310  Hepatitis B surface antigen  Once,   STAT     12/16/18 2311   12/16/18 2310  Interleukin-6, Plasma  Once,   STAT     12/16/18 2311   12/16/18 2310  Sedimentation rate  Once,   STAT     12/16/18 2311   12/16/18 2310  Triglycerides  Once,   STAT     12/16/18 2311   12/16/18 2310  Troponin I (High Sensitivity)  STAT Now then every 2 hours,   STAT     12/16/18 2311   12/16/18 2310  Procalcitonin  Once,   STAT     12/16/18 2311   12/16/18 2309  Type and screen [facility]  Once,   STAT    Comments: [facility]    12/16/18 2311   12/16/18 2308  Creatinine, serum  (enoxaparin (LOVENOX)    CrCl >/= 30 ml/min)  Once,   STAT    Comments: Baseline for enoxaparin therapy IF NOT ALREADY DRAWN.    12/16/18 2311   12/16/18 2306  Influenza panel by PCR (type A & B)  Add-on,   AD     12/16/18 2306   12/16/18 2306  Respiratory Panel by PCR  (Respiratory virus panel with precautions)  Add-on,   AD     12/16/18 2306   12/16/18 1854  Culture, blood (Routine X 2) w Reflex to ID Panel  BLOOD CULTURE X 2,   R  (with STAT occurrences)     12/16/18 1853   12/16/18 1853  HIV antibody (Routine Testing)  Once,   STAT     12/16/18 1853          Vitals/Pain Today's Vitals   12/16/18 2345 12/17/18 0000 12/17/18 0015 12/17/18 0027  BP: 119/79 108/73 120/85   Pulse: 96     Resp:      Temp:    (!) 101.4 F (38.6 C)  TempSrc:    Oral  SpO2: 100%     Weight:      Height:      PainSc:    0-No pain    Isolation Precautions Airborne and Contact precautions  Medications Medications  fenofibrate tablet 160 mg (has no administration in time range)  multivitamin with minerals tablet 1 tablet (has no administration in time range)  ipratropium (ATROVENT HFA) inhaler  2 puff (has no administration in time range)  levalbuterol (XOPENEX HFA) inhaler 2 puff (has no administration in time range)  dextromethorphan-guaiFENesin (MUCINEX DM) 30-600 MG per 12 hr tablet 1 tablet (has no administration in time range)  nicotine (NICODERM CQ - dosed in mg/24 hours) patch 21 mg (has no administration in time range)  enoxaparin (LOVENOX) injection 40 mg (has no administration in time range)  ondansetron (ZOFRAN) tablet 4 mg (has no administration in time range)    Or  ondansetron (ZOFRAN) injection 4 mg (has no administration in time range)  acetaminophen (TYLENOL) tablet 650 mg (650 mg Oral Given 12/17/18 0029)  methylPREDNISolone sodium succinate (SOLU-MEDROL) 125 mg/2 mL injection 60 mg (60 mg Intravenous Given 12/17/18 0027)  zinc sulfate capsule 220 mg (has no administration in time range)  vitamin C (ASCORBIC ACID) tablet 500 mg (has no administration in time range)  acetaminophen (TYLENOL) tablet 650 mg (650 mg Oral Given 12/16/18 1805)  sodium chloride 0.9 % bolus 1,000 mL (0 mLs Intravenous Stopped 12/17/18 0030)  albuterol (VENTOLIN HFA) 108 (90 Base) MCG/ACT inhaler 2 puff (2 puffs Inhalation Given 12/16/18 2140)    Mobility walks Low fall risk   Focused Assessments    R Recommendations: See  Admitting Provider Note  Report given to:   Additional Notes:  He's very sweet. AOx4. On 2 lpm via [place].

## 2018-12-17 NOTE — ED Notes (Signed)
Attempted report x 3.  

## 2018-12-17 NOTE — Progress Notes (Signed)
Discharge instructions given to patient and patient verbalizes understanding as well as need to self quarantine for 14 days. Will d/c via WC to private vehicle.

## 2018-12-17 NOTE — Discharge Summary (Signed)
Physician Discharge Summary  Charles Diaz DOB: May 10, 1981 DOA: 12/16/2018  PCP: Colon Branch, MD  Admit date: 12/16/2018 Discharge date: 12/17/2018  Admitted From: Home Disposition: Home   Recommendations for Outpatient Follow-up:  1. Follow up with PCP in 1-2 weeks  Home Health: None Equipment/Devices: None Discharge Condition: Stable CODE STATUS: Full Diet recommendation: As tolerated  Brief/Interim Summary: Charles Diaz is a 38 y.o. male with medical history significant of hyperlipidemia, tobacco abuse, CKD 2, who presents with shortness of breath, fever and chills.  Patient states that he was tested positive for COVID-19 by PCP on 12/07/2018.  He has been at home monitoring his symptoms. In the past several days, his shortness breath has been progressively worsening.  He also has some dry cough.  He has fever of 103 at home today, body aches and chills.  Denies chest pain. He has loss in taste and smell. He has mild diarrhea in the past several days, 1-2 times each day.  No nausea vomiting or abdominal pain.  No symptoms of UTI or unilateral weakness.  ED Course: pt was found to have WBC 6.8, BNP 13.1, slightly worsening renal function, d-dimer 0.63, LDH 237, ferritin 450, fibrinogen 620, normal liver function.  Temperature 102.4, tachycardia, oxygen desaturation to 92% on ambulation, 95% at rest, blood pressure 112/73, tachycardia.  Chest x-ray showed bilateral groundglass infiltration.  Patient is placed on telemetry bed for observation.  Hospital Course: Symptoms improved dramatically and he was discharged the next day.  Discharge Diagnoses:  Principal Problem:   Acute respiratory disease due to COVID-19 virus Active Problems:   Dyslipidemia   CKD (chronic kidney disease) stage 2, GFR 60-89 ml/min   Sepsis ([place])  Acute respiratory disease and sepsis due to COVID-19 virus and sepsis: Chest x-ray showed a bilateral groundglass infiltration.  No hypoxia.  Patient meets critical for sepsis with fever and tachycardia. 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    Discharge Instructions Discharge Instructions    Discharge instructions   Complete by: As directed    You were admitted for covid-19 pneumonia and have improved with steroids. No further treatment is required at this time as you do not require supplemental oxygen. You are stable for discharge, but will need to continue steroids (prednisone) for 7 more days, first dose tomorrow. If you become more short of breath, seek medical attention. Otherwise, you may take tylenol for the fever or over the counter medications for coughing. Remain in isolation for 2 weeks to prevent transmission to others.   - Do not take NSAID medications (including, but not limited to, ibuprofen, advil, motrin, naproxen, aleve, goody's powder, etc.) - Follow up with your doctor in the next week via telehealth or seek medical attention right away if your symptoms get WORSE.  - Consider donating plasma after you have recovered (either 14 days after a negative test or 28 days after symptoms have completely resolved) because your antibodies to this virus may be helpful to give to others with life-threatening infections. Please go to the website www.[email or web address] if you would like to consider volunteering for plasma donation.    Directions for you at home:  Wear a facemask You should wear a facemask that covers your nose and mouth when you are in the same room with other people and when you visit a healthcare provider. People who live with or visit you  should also wear a facemask while they are in the same room with you.  Separate yourself from other people in your home As much as possible, you should stay in a different room from other people in your home. Also, you should use a  separate bathroom, if available.  Avoid sharing household items You should not share dishes, drinking glasses, cups, eating utensils, towels, bedding, or other items with other people in your home. After using these items, you should wash them thoroughly with soap and water.  Cover your coughs and sneezes Cover your mouth and nose with a tissue when you cough or sneeze, or you can cough or sneeze into your sleeve. Throw used tissues in a lined trash can, and immediately wash your hands with soap and water for at least 20 seconds or use an alcohol-based hand rub.  Wash your [employer]hands Wash your hands often and thoroughly with soap and water for at least 20 seconds. You can use an alcohol-based hand sanitizer if soap and water are not available and if your hands are not visibly dirty. Avoid touching your eyes, nose, and mouth with unwashed hands.  Directions for those who live with, or provide care at home for you:  Limit the number of people who have contact with the patient If possible, have only one caregiver for the patient. Other household members should stay in another home or place of residence. If this is not possible, they should stay in another room, or be separated from the patient as much as possible. Use a separate bathroom, if available. Restrict visitors who do not have an essential need to be in the home.  Ensure good ventilation Make sure that shared spaces in the home have good air flow, such as from an air conditioner or an opened window, weather permitting.  Wash your hands often Wash your hands often and thoroughly with soap and water for at least 20 seconds. You can use an alcohol based hand sanitizer if soap and water are not available and if your hands are not visibly dirty. Avoid touching your eyes, nose, and mouth with unwashed hands. Use disposable paper towels to dry your hands. If not available, use dedicated cloth towels and replace them when they become  wet.  Wear a facemask and gloves Wear a disposable facemask at all times in the room and gloves when you touch or have contact with the patient's blood, body fluids, and/or secretions or excretions, such as sweat, saliva, sputum, nasal mucus, vomit, urine, or feces.  Ensure the mask fits over your nose and mouth tightly, and do not touch it during use. Throw out disposable facemasks and gloves after using them. Do not reuse. Wash your hands immediately after removing your facemask and gloves. If your personal clothing becomes contaminated, carefully remove clothing and launder. Wash your hands after handling contaminated clothing. Place all used disposable facemasks, gloves, and other waste in a lined container before disposing them with other household waste. Remove gloves and wash your hands immediately after handling these items.  Do not share dishes, glasses, or other household items with the patient Avoid sharing household items. You should not share dishes, drinking glasses, cups, eating utensils, towels, bedding, or other items with a patient who is confirmed to have, or being evaluated for, COVID-19 infection. After the person uses these items, you should wash them thoroughly with soap and water.  Wash laundry thoroughly Immediately remove and wash clothes or bedding that have blood, body fluids, and/or  secretions or excretions, such as sweat, saliva, sputum, nasal mucus, vomit, urine, or feces, on them. Wear gloves when handling laundry from the patient. Read and follow directions on labels of laundry or clothing items and detergent. In general, wash and dry with the warmest temperatures recommended on the label.  Clean all areas the individual has used often Clean all touchable surfaces, such as counters, tabletops, doorknobs, bathroom fixtures, toilets, phones, keyboards, tablets, and bedside tables, every day. Also, clean any surfaces that may have blood, body fluids, and/or  secretions or excretions on them. Wear gloves when cleaning surfaces the patient has come in contact with. Use a diluted bleach solution (e.g., dilute bleach with 1 part bleach and 10 parts water) or a household disinfectant with a label that says EPA-registered for coronaviruses. To make a bleach solution at home, add 1 tablespoon of bleach to 1 quart (4 cups) of water. For a larger supply, add  cup of bleach to 1 gallon (16 cups) of water. Read labels of cleaning products and follow recommendations provided on product labels. Labels contain instructions for safe and effective use of the cleaning product including precautions you should take when applying the product, such as wearing gloves or eye protection and making sure you have good ventilation during use of the product. Remove gloves and wash hands immediately after cleaning.  Monitor yourself for signs and symptoms of illness Caregivers and household members are considered close contacts, should monitor their health, and will be asked to limit movement outside of the home to the extent possible. Follow the monitoring steps for close contacts listed on the symptom monitoring form.   If you have additional questions, contact your local health department or call the epidemiologist on call at [phone] (available 24/7). This guidance is subject to change. For the most up-to-date guidance from CDC, please refer to their website: TripMetro.huhttps://www.cdc.gov/coronavirus/2019-ncov/hcp/guidance-prevent-spread.html     Allergies as of 12/17/2018   No Known Allergies     Medication List    TAKE these medications   acetaminophen 325 MG tablet Commonly known as: TYLENOL Take 650 mg by mouth every 6 (six) hours as needed for fever.   fenofibrate 160 MG tablet Take 1 tablet (160 mg total) by mouth daily.   multivitamin with minerals Tabs tablet Take 1 tablet by mouth daily.   predniSONE 20 MG tablet Commonly known as: DELTASONE Take 2 tablets  (40 mg total) by mouth daily for 7 days.      Follow-up Information    Wanda PlumpPaz, Jose E, MD. Schedule an appointment as soon as possible for a visit.   Specialty: Internal Medicine Contact information: [phone] [place] [address] [phone]          No Known Allergies  Consultations:  None  Procedures/Studies: Dg Chest Portable 1 View  Result Date: 12/16/2018 CLINICAL DATA:  Shortness of breath, COVID-19 EXAM: PORTABLE CHEST 1 VIEW COMPARISON:  12/16/2018 FINDINGS: The heart size and mediastinal contours are within normal limits. Extensive patchy heterogeneous and ground-glass pulmonary opacity bilaterally. The visualized skeletal structures are unremarkable. IMPRESSION: Extensive patchy heterogeneous and ground-glass pulmonary opacity bilaterally, consistent with reported diagnosis of COVID-19. Electronically Signed   By: Lauralyn PrimesAlex  Bibbey M.D.   On: 12/16/2018 18:07    Subjective: Feels better than yesterday. Fever seems to have broken and he denies any shortness of breath at rest or with exertion currently. No chest pain. Feels fine going home. Wife has been admitted for covid as well.  Discharge Exam: Vitals:   12/17/18 0235 12/17/18 0850  BP:  111/73  Pulse:    Resp:    Temp: 99.2 F (37.3 C) 98 F (36.7 C)  SpO2:  92%   General: Pt is alert, awake, not in acute distress Cardiovascular: RRR, S1/S2 +, no rubs, no gallops Respiratory: CTA bilaterally, no wheezing, no rhonchi Abdominal: Soft, NT, ND, bowel sounds + Extremities: No edema, no cyanosis  Labs: BNP (last 3 results) Recent Labs    12/16/18 1854  BNP 13.1   Basic Metabolic Panel: Recent Labs  Lab 12/16/18 1741 12/17/18 0002  NA 137  --   K 4.0  --   CL 105  --   CO2 24  --   GLUCOSE 121*  --   BUN 9  --   CREATININE 1.30* 1.20  CALCIUM 9.0  --    Liver Function Tests: Recent Labs  Lab 12/16/18 1741  AST 36  ALT 30  ALKPHOS 44  BILITOT 0.6  PROT 6.8   ALBUMIN 3.5   No results for input(s): LIPASE, AMYLASE in the last 168 hours. No results for input(s): AMMONIA in the last 168 hours. CBC: Recent Labs  Lab 12/16/18 1741  WBC 6.8  NEUTROABS 5.0  HGB 14.3  HCT 41.0  MCV 82.5  PLT 167   Cardiac Enzymes: No results for input(s): CKTOTAL, CKMB, CKMBINDEX, TROPONINI in the last 168 hours. BNP: Invalid input(s): POCBNP CBG: No results for input(s): GLUCAP in the last 168 hours. D-Dimer Recent Labs    12/16/18 1941  DDIMER 0.63*   Hgb A1c No results for input(s): HGBA1C in the last 72 hours. Lipid Profile Recent Labs    12/17/18 0002  TRIG 122   Thyroid function studies No results for input(s): TSH, T4TOTAL, T3FREE, THYROIDAB in the last 72 hours.  Invalid input(s): FREET3 Anemia work up [facility]    12/16/18 1941  FERRITIN 450*   Urinalysis No results found for: COLORURINE, APPEARANCEUR, LABSPEC, PHURINE, GLUCOSEU, HGBUR, BILIRUBINUR, KETONESUR, PROTEINUR, UROBILINOGEN, NITRITE, LEUKOCYTESUR  Microbiology Recent Results (from the past 240 hour(s))  Culture, blood (Routine X 2) w Reflex to ID Panel     Status: None (Preliminary result)   Collection Time: 12/16/18  7:42 PM   Specimen: BLOOD  Result Value Ref Range Status   Specimen Description BLOOD LEFT ARM  Final   Special Requests   Final    BOTTLES DRAWN AEROBIC AND ANAEROBIC Blood Culture adequate volume   Culture   Final    NO GROWTH 2 DAYS Performed at [facility], [address] [place], [place] [address]    Report Status PENDING  Incomplete  Culture, blood (Routine X 2) w Reflex to ID Panel     Status: None (Preliminary result)   Collection Time: 12/16/18  8:27 PM   Specimen: BLOOD LEFT HAND  Result Value Ref Range Status   Specimen Description BLOOD LEFT HAND  Final   Special Requests   Final    BOTTLES DRAWN AEROBIC ONLY Blood Culture results may not be optimal due to an excessive volume of blood received in culture bottles   Culture    Final    NO GROWTH 2 DAYS Performed at [facility], [address] [place], [place] [address]    Report Status PENDING  Incomplete    Time coordinating discharge: Approximately 40 minutes  Tyrone Nineyan B Nakina Spatz, MD  [facility] 12/18/2018, 1:50 PM

## 2018-12-18 LAB — HEPATITIS B SURFACE ANTIGEN: Hepatitis B Surface Ag: NEGATIVE

## 2018-12-18 LAB — INTERLEUKIN-6, PLASMA: Interleukin-6, Plasma: 54.3 pg/mL — ABNORMAL HIGH (ref 0.0–12.2)

## 2018-12-19 ENCOUNTER — Telehealth: Payer: Self-pay | Admitting: *Deleted

## 2018-12-19 NOTE — Telephone Encounter (Signed)
LVM for pt to call office

## 2018-12-20 NOTE — Telephone Encounter (Signed)
LVM. 2ND ATTEMPT

## 2018-12-20 NOTE — Telephone Encounter (Signed)
Hospital follow up scheduled 12/24/18

## 2018-12-21 LAB — CULTURE, BLOOD (ROUTINE X 2)
Culture: NO GROWTH
Culture: NO GROWTH
Special Requests: ADEQUATE

## 2018-12-24 ENCOUNTER — Other Ambulatory Visit: Payer: Self-pay

## 2018-12-24 ENCOUNTER — Telehealth: Payer: Self-pay | Admitting: Internal Medicine

## 2018-12-24 ENCOUNTER — Ambulatory Visit (INDEPENDENT_AMBULATORY_CARE_PROVIDER_SITE_OTHER): Payer: BC Managed Care – PPO | Admitting: Internal Medicine

## 2018-12-24 DIAGNOSIS — U071 COVID-19: Secondary | ICD-10-CM | POA: Diagnosis not present

## 2018-12-24 DIAGNOSIS — J069 Acute upper respiratory infection, unspecified: Secondary | ICD-10-CM | POA: Diagnosis not present

## 2018-12-24 NOTE — Assessment & Plan Note (Addendum)
COVID-19: Since the last office visit, COVID-19 testing came back positive, he got short of breath and was very achy, went to the ER and was admitted.  He met sepsis criteria. Was treated with the steroids, he finished them yesterday. He has been home already for 1 week, gradually improving.  See review of systems. Plan:  CBC, CMP and chest x-ray next week Cough management with Mucinex DM.  Call if not getting back to normal in the next 1 or 2 weeks. Dyslipidemia: Holding fenofibrate due to recent acute illness, okay to go back on it at his convenience RTC next week for blood work and chest x-ray CPX by 02-2019

## 2018-12-24 NOTE — Telephone Encounter (Signed)
Called pt for lab appt this week. Left msg

## 2018-12-24 NOTE — Telephone Encounter (Signed)
Pt returned vm to schedule labs. Office closed

## 2018-12-24 NOTE — Telephone Encounter (Signed)
Called pt back labs scheduled

## 2018-12-24 NOTE — Progress Notes (Signed)
Subjective:    Patient ID: Charles Diaz, male    DOB: 1980/11/15, 38 y.o.   MRN: [id number]  DOS:  12/24/2018 Type of visit - description: Virtual Visit via Video Note  I connected with@ on 12/24/18 at 11:00 AM EDT by a video enabled telemedicine application and verified that I am speaking with the correct person using two identifiers.   THIS ENCOUNTER IS A VIRTUAL VISIT DUE TO COVID-19 - PATIENT WAS NOT SEEN IN THE OFFICE. PATIENT HAS CONSENTED TO VIRTUAL VISIT / TELEMEDICINE VISIT   Location of patient: home  Location of provider: office  I discussed the limitations of evaluation and management by telemedicine and the availability of in person appointments. The patient expressed understanding and agreed to proceed.  History of Present Illness:  TCM 7 The patient developed symptoms of a viral syndrome 12/06/2018, subsequently a COVID-19 testing came back positive. He eventually got short of breath, had ongoing fever up to 103.0, aches and chills and went to the ER. He was admitted and  discharge 12/17/2018. He was treated with the steroids only. The last time he had fever was actually 12/17/2018. He is now home and slowly recuperating.   Review of Systems At this point, he is feeling better but not 100%, he is active, trying to do some walking. Reports no shortness of breath He has mild to moderate persistent dry cough Appetite is improving Sense of taste and smell is coming back No myalgias.  No headache. His wife is at home she is also recuperating from COVID-72  Past Medical History:  Diagnosis Date  . CKD (chronic kidney disease) stage 2, GFR 60-89 ml/min   . Dyslipidemia     Past Surgical History:  Procedure Laterality Date  . KNEE ARTHROSCOPY Right 2009    Social History   Socioeconomic History  . Marital status: Married    Spouse name: Not on file  . Number of children: 1  . Years of education: Not on file  . Highest education level: Not on file   Occupational History  . Occupation: Production designer, theatre/television/film, Oceanographer  . Financial resource strain: Not on file  . Food insecurity    Worry: Not on file    Inability: Not on file  . Transportation needs    Medical: Not on file    Non-medical: Not on file  Tobacco Use  . Smoking status: Current Some Day Smoker  . Smokeless tobacco: Never Used  . Tobacco comment: quit ~ 08-2015 (smokes rarely )  Substance and Sexual Activity  . Alcohol use: Yes    Comment: socially  . Drug use: No  . Sexual activity: Not on file  Lifestyle  . Physical activity    Days per week: Not on file    Minutes per session: Not on file  . Stress: Not on file  Relationships  . Social Herbalist on phone: Not on file    Gets together: Not on file    Attends religious service: Not on file    Active member of club or organization: Not on file    Attends meetings of clubs or organizations: Not on file    Relationship status: Not on file  . Intimate partner violence    Fear of current or ex partner: Not on file    Emotionally abused: Not on file    Physically abused: Not on file    Forced sexual activity: Not on file  Other Topics Concern  .  Not on file  Social History Narrative   Married    Household: pt, wife , child (2017)   Parents from [place]      Allergies as of 12/24/2018   No Known Allergies     Medication List       Accurate as of December 24, 2018 10:57 AM. If you have any questions, ask your nurse or doctor.        acetaminophen 325 MG tablet Commonly known as: TYLENOL Take 650 mg by mouth every 6 (six) hours as needed for fever.   fenofibrate 160 MG tablet Take 1 tablet (160 mg total) by mouth daily.   multivitamin with minerals Tabs tablet Take 1 tablet by mouth daily.   predniSONE 20 MG tablet Commonly known as: DELTASONE Take 2 tablets (40 mg total) by mouth daily for 7 days.           Objective:   Physical Exam There were no vitals taken for this visit.  This is a virtual video visit, patient is alert oriented x3, no distress, speaking in complete sentences    Assessment      Assessment Dyslipidemia , high TG L arm skin lesion, s/p Bx per derm before, no dx made, as off 8-17 decreasing in size per pt  Increase LFTs 2, hep B and C serology (-) 10/2016  PLAN:  COVID-19: Since the last office visit, COVID-19 testing came back positive, he got short of breath and was very achy, went to the ER and was admitted.  He met sepsis criteria. Was treated with the steroids, he finished them yesterday. He has been home already for 1 week, gradually improving.  See review of systems. Plan:  CBC, CMP and chest x-ray next week Cough management with Mucinex DM.  Call if not getting back to normal in the next 1 or 2 weeks. Dyslipidemia: Holding fenofibrate due to recent acute illness, okay to go back on it at his convenience RTC next week for blood work and chest x-ray CPX by 02-2019

## 2019-01-04 ENCOUNTER — Other Ambulatory Visit: Payer: Self-pay

## 2019-01-04 ENCOUNTER — Other Ambulatory Visit (INDEPENDENT_AMBULATORY_CARE_PROVIDER_SITE_OTHER): Payer: BC Managed Care – PPO

## 2019-01-04 ENCOUNTER — Ambulatory Visit (HOSPITAL_BASED_OUTPATIENT_CLINIC_OR_DEPARTMENT_OTHER)
Admission: RE | Admit: 2019-01-04 | Discharge: 2019-01-04 | Disposition: A | Discharge status: Home / Self Care | Payer: BC Managed Care – PPO | Source: Ambulatory Visit | Attending: Internal Medicine | Admitting: Internal Medicine

## 2019-01-04 DIAGNOSIS — U071 COVID-19: Secondary | ICD-10-CM | POA: Insufficient documentation

## 2019-01-04 DIAGNOSIS — J069 Acute upper respiratory infection, unspecified: Secondary | ICD-10-CM | POA: Diagnosis not present

## 2019-01-04 DIAGNOSIS — J1289 Other viral pneumonia: Secondary | ICD-10-CM | POA: Diagnosis not present

## 2019-01-04 LAB — COMPREHENSIVE METABOLIC PANEL
ALT: 56 U/L — ABNORMAL HIGH (ref 0–53)
AST: 26 U/L (ref 0–37)
Albumin: 4.2 g/dL (ref 3.5–5.2)
Alkaline Phosphatase: 70 U/L (ref 39–117)
BUN: 14 mg/dL (ref 6–23)
CO2: 30 mEq/L (ref 19–32)
Calcium: 9.3 mg/dL (ref 8.4–10.5)
Chloride: 108 mEq/L (ref 96–112)
Creatinine, Ser: 1.01 mg/dL (ref 0.40–1.50)
GFR: 82.91 mL/min (ref >60.00)
Glucose, Bld: 93 mg/dL (ref 70–99)
Potassium: 4.2 mEq/L (ref 3.5–5.1)
Sodium: 144 mEq/L (ref 135–145)
Total Bilirubin: 0.7 mg/dL (ref 0.2–1.2)
Total Protein: 6.6 g/dL (ref 6.0–8.3)

## 2019-01-04 LAB — CBC WITH DIFFERENTIAL/PLATELET
Basophils Absolute: 0 10*3/uL (ref 0.0–0.1)
Basophils Relative: 0.5 % (ref 0.0–3.0)
Eosinophils Absolute: 0.2 10*3/uL (ref 0.0–0.7)
Eosinophils Relative: 3.3 % (ref 0.0–5.0)
HCT: 41.5 % (ref 39.0–52.0)
Hemoglobin: 14.1 g/dL (ref 13.0–17.0)
Lymphocytes Relative: 38.8 % (ref 12.0–46.0)
Lymphs Abs: 2.4 10*3/uL (ref 0.7–4.0)
MCHC: 34 g/dL (ref 30.0–36.0)
MCV: 85.4 fl (ref 78.0–100.0)
Monocytes Absolute: 0.7 10*3/uL (ref 0.1–1.0)
Monocytes Relative: 11.2 % (ref 3.0–12.0)
Neutro Abs: 2.8 10*3/uL (ref 1.4–7.7)
Neutrophils Relative %: 46.2 % (ref 43.0–77.0)
Platelets: 159 10*3/uL (ref 150.0–400.0)
RBC: 4.87 Mil/uL (ref 4.22–5.81)
RDW: 14.3 % (ref 11.5–15.5)
WBC: 6.1 10*3/uL (ref 4.0–10.5)

## 2019-03-01 ENCOUNTER — Encounter: Payer: BLUE CROSS/BLUE SHIELD | Admitting: Internal Medicine

## 2019-03-26 ENCOUNTER — Other Ambulatory Visit: Payer: Self-pay

## 2019-03-27 ENCOUNTER — Encounter: Payer: Self-pay | Admitting: Internal Medicine

## 2019-03-27 ENCOUNTER — Ambulatory Visit (INDEPENDENT_AMBULATORY_CARE_PROVIDER_SITE_OTHER): Payer: BC Managed Care – PPO | Admitting: Internal Medicine

## 2019-03-27 VITALS — BP 125/75 | HR 77 | Temp 97.5°F | Resp 16 | Ht 69.0 in | Wt 250.0 lb

## 2019-03-27 DIAGNOSIS — Z0000 Encounter for general adult medical examination without abnormal findings: Secondary | ICD-10-CM

## 2019-03-27 NOTE — Assessment & Plan Note (Signed)
-  Tdap 2014 - flu shot: declined, benefits discussed -CCS: Never had a cscope -Labs: FLP, AST, ALT, TSH -Diet and exercise discussed

## 2019-03-27 NOTE — Patient Instructions (Signed)
GO TO THE LAB : Get the blood work     GO TO THE FRONT DESK Schedule your next appointment   for next year, physical exam, fasting

## 2019-03-27 NOTE — Progress Notes (Signed)
Subjective:    Patient ID: Charles Diaz, male    DOB: 09/13/1980, 38 y.o.   MRN: [id number]  DOS:  03/27/2019 Type of visit - description: CPX Currently doing well, had COVID-19, he is fully recuperated. He is back to exercising and playing some soccer.  Review of Systems  A 14 point review of systems is negative    Past Medical History:  Diagnosis Date  . Dyslipidemia     Past Surgical History:  Procedure Laterality Date  . KNEE ARTHROSCOPY Right 2009    Social History   Socioeconomic History  . Marital status: Married    Spouse name: Not on file  . Number of children: 1  . Years of education: Not on file  . Highest education level: Not on file  Occupational History  . Occupation: Advertising account planner, Artist  . Financial resource strain: Not on file  . Food insecurity    Worry: Not on file    Inability: Not on file  . Transportation needs    Medical: Not on file    Non-medical: Not on file  Tobacco Use  . Smoking status: Former Smoker    Quit date: 03/26/2018    Years since quitting: 1.0  . Smokeless tobacco: Never Used  . Tobacco comment:    Substance and Sexual Activity  . Alcohol use: Yes    Comment: socially  . Drug use: No  . Sexual activity: Not on file  Lifestyle  . Physical activity    Days per week: Not on file    Minutes per session: Not on file  . Stress: Not on file  Relationships  . Social Musician on phone: Not on file    Gets together: Not on file    Attends religious service: Not on file    Active member of club or organization: Not on file    Attends meetings of clubs or organizations: Not on file    Relationship status: Not on file  . Intimate partner violence    Fear of current or ex partner: Not on file    Emotionally abused: Not on file    Physically abused: Not on file    Forced sexual activity: Not on file  Other Topics Concern  . Not on file  Social History Narrative   Married    Household: pt,  wife , child (2017)   Parents from [place]     Family History  Problem Relation Age of Onset  . Diabetes Other        GM x2  . Hypertension Father   . Cancer Other        uncle, stomach ca?  . Colon cancer Neg Hx   . Prostate cancer Neg Hx   . CAD Neg Hx      Allergies as of 03/27/2019   No Known Allergies     Medication List       Accurate as of March 27, 2019 11:59 PM. If you have any questions, ask your nurse or doctor.        acetaminophen 325 MG tablet Commonly known as: TYLENOL Take 650 mg by mouth every 6 (six) hours as needed for fever.   fenofibrate 160 MG tablet Take 1 tablet (160 mg total) by mouth daily.   multivitamin with minerals Tabs tablet Take 1 tablet by mouth daily.           Objective:   Physical Exam BP 125/75 (BP  Location: Left Arm, Patient Position: Sitting, Cuff Size: Normal)   Pulse 77   Temp (!) 97.5 F (36.4 C) (Temporal)   Resp 16   Ht 5\' 9"  (1.753 m)   Wt 250 lb (113.4 kg)   SpO2 99%   BMI 36.92 kg/m  General: Well developed, NAD, BMI noted Neck: No  thyromegaly  HEENT:  Normocephalic . Face symmetric, atraumatic Lungs:  CTA B Normal respiratory effort, no intercostal retractions, no accessory muscle use. Heart: RRR,  no murmur.  No pretibial edema bilaterally  Abdomen:  Not distended, soft, non-tender. No rebound or rigidity.   Skin: Exposed areas without rash. Not pale. Not jaundice Neurologic:  alert & oriented X3.  Speech normal, gait appropriate for age and unassisted Strength symmetric and appropriate for age.  Psych: Cognition and judgment appear intact.  Cooperative with normal attention span and concentration.  Behavior appropriate. No anxious or depressed appearing.     Assessment      Assessment Dyslipidemia , high TG L arm skin lesion, s/p Bx per derm before, no dx made, as off 8-17 decreasing in size per pt  Increase LFTs 2, hep B and C serology (-) 10/2016  PLAN:  Here for CPX  Dyslipidemia: Not fasting, will check FLP, continue fenofibrates COVID-19: Fully recuperated, follow-up chest x-ray was (-) RTC 1 year

## 2019-03-27 NOTE — Progress Notes (Signed)
Pre visit review using our clinic review tool, if applicable. No additional management support is needed unless otherwise documented below in the visit note. 

## 2019-03-28 LAB — LIPID PANEL
Cholesterol: 167 mg/dL (ref 0–200)
HDL: 27.3 mg/dL — ABNORMAL LOW (ref >39.00)
Total CHOL/HDL Ratio: 6
Triglycerides: 431 mg/dL — ABNORMAL HIGH (ref 0.0–149.0)

## 2019-03-28 LAB — ALT: ALT: 42 U/L (ref 0–53)

## 2019-03-28 LAB — TSH: TSH: 1.94 u[IU]/mL (ref 0.35–4.50)

## 2019-03-28 LAB — LDL CHOLESTEROL, DIRECT: Direct LDL: 56 mg/dL

## 2019-03-28 LAB — AST: AST: 33 U/L (ref 0–37)

## 2019-03-28 NOTE — Assessment & Plan Note (Signed)
Here for CPX Dyslipidemia: Not fasting, will check FLP, continue fenofibrates COVID-19: Fully recuperated, follow-up chest x-ray was (-) RTC 1 year

## 2019-05-30 ENCOUNTER — Other Ambulatory Visit: Payer: Self-pay

## 2019-05-30 DIAGNOSIS — Z20822 Contact with and (suspected) exposure to covid-19: Secondary | ICD-10-CM

## 2019-06-01 LAB — NOVEL CORONAVIRUS, NAA: SARS-CoV-2, NAA: NOT DETECTED

## 2019-09-07 ENCOUNTER — Other Ambulatory Visit: Payer: Self-pay | Admitting: Internal Medicine

## 2020-01-27 ENCOUNTER — Encounter (INDEPENDENT_AMBULATORY_CARE_PROVIDER_SITE_OTHER): Payer: Self-pay | Admitting: Bariatrics

## 2020-01-27 ENCOUNTER — Other Ambulatory Visit: Payer: Self-pay

## 2020-01-27 ENCOUNTER — Ambulatory Visit (INDEPENDENT_AMBULATORY_CARE_PROVIDER_SITE_OTHER): Payer: BC Managed Care – PPO | Admitting: Bariatrics

## 2020-01-27 VITALS — BP 110/76 | HR 64 | Temp 98.5°F | Ht 68.0 in | Wt 260.0 lb

## 2020-01-27 DIAGNOSIS — Z6839 Body mass index (BMI) 39.0-39.9, adult: Secondary | ICD-10-CM

## 2020-01-27 DIAGNOSIS — Z9189 Other specified personal risk factors, not elsewhere classified: Secondary | ICD-10-CM

## 2020-01-27 DIAGNOSIS — Z0289 Encounter for other administrative examinations: Secondary | ICD-10-CM

## 2020-01-27 DIAGNOSIS — Z1331 Encounter for screening for depression: Secondary | ICD-10-CM

## 2020-01-27 DIAGNOSIS — E559 Vitamin D deficiency, unspecified: Secondary | ICD-10-CM | POA: Diagnosis not present

## 2020-01-27 DIAGNOSIS — E785 Hyperlipidemia, unspecified: Secondary | ICD-10-CM

## 2020-01-27 DIAGNOSIS — R0602 Shortness of breath: Secondary | ICD-10-CM | POA: Diagnosis not present

## 2020-01-27 DIAGNOSIS — R5383 Other fatigue: Secondary | ICD-10-CM | POA: Diagnosis not present

## 2020-01-28 ENCOUNTER — Encounter (INDEPENDENT_AMBULATORY_CARE_PROVIDER_SITE_OTHER): Payer: Self-pay | Admitting: Bariatrics

## 2020-01-28 ENCOUNTER — Telehealth (INDEPENDENT_AMBULATORY_CARE_PROVIDER_SITE_OTHER): Payer: Self-pay | Admitting: Bariatrics

## 2020-01-28 DIAGNOSIS — Z6839 Body mass index (BMI) 39.0-39.9, adult: Secondary | ICD-10-CM | POA: Insufficient documentation

## 2020-01-28 DIAGNOSIS — R748 Abnormal levels of other serum enzymes: Secondary | ICD-10-CM | POA: Insufficient documentation

## 2020-01-28 DIAGNOSIS — E782 Mixed hyperlipidemia: Secondary | ICD-10-CM | POA: Insufficient documentation

## 2020-01-28 DIAGNOSIS — E559 Vitamin D deficiency, unspecified: Secondary | ICD-10-CM | POA: Insufficient documentation

## 2020-01-28 LAB — LIPID PANEL WITH LDL/HDL RATIO
Cholesterol, Total: 222 mg/dL — ABNORMAL HIGH (ref 100–199)
HDL: 25 mg/dL — ABNORMAL LOW (ref >39)
LDL Chol Calc (NIH): 101 mg/dL — ABNORMAL HIGH (ref 0–99)
LDL/HDL Ratio: 4 ratio — ABNORMAL HIGH (ref 0.0–3.6)
Triglycerides: 566 mg/dL (ref 0–149)
VLDL Cholesterol Cal: 96 mg/dL — ABNORMAL HIGH (ref 5–40)

## 2020-01-28 LAB — COMPREHENSIVE METABOLIC PANEL
ALT: 108 IU/L — ABNORMAL HIGH (ref 0–44)
AST: 59 IU/L — ABNORMAL HIGH (ref 0–40)
Albumin/Globulin Ratio: 2.1 (ref 1.2–2.2)
Albumin: 4.5 g/dL (ref 4.0–5.0)
Alkaline Phosphatase: 114 IU/L (ref 48–121)
BUN/Creatinine Ratio: 16 (ref 9–20)
BUN: 16 mg/dL (ref 6–20)
Bilirubin Total: 0.6 mg/dL (ref 0.0–1.2)
CO2: 22 mmol/L (ref 20–29)
Calcium: 9.4 mg/dL (ref 8.7–10.2)
Chloride: 104 mmol/L (ref 96–106)
Creatinine, Ser: 0.98 mg/dL (ref 0.76–1.27)
GFR calc Af Amer: 113 mL/min/{1.73_m2} (ref >59)
GFR calc non Af Amer: 97 mL/min/{1.73_m2} (ref >59)
Globulin, Total: 2.1 g/dL (ref 1.5–4.5)
Glucose: 93 mg/dL (ref 65–99)
Potassium: 4.1 mmol/L (ref 3.5–5.2)
Sodium: 138 mmol/L (ref 134–144)
Total Protein: 6.6 g/dL (ref 6.0–8.5)

## 2020-01-28 LAB — INSULIN, RANDOM: INSULIN: 14.4 u[IU]/mL (ref 2.6–24.9)

## 2020-01-28 LAB — T4, FREE: Free T4: 0.87 ng/dL (ref 0.82–1.77)

## 2020-01-28 LAB — HEMOGLOBIN A1C
Est. average glucose Bld gHb Est-mCnc: 108 mg/dL
Hgb A1c MFr Bld: 5.4 % (ref 4.8–5.6)

## 2020-01-28 LAB — T3: T3, Total: 118 ng/dL (ref 71–180)

## 2020-01-28 LAB — TSH: TSH: 1.8 u[IU]/mL (ref 0.450–4.500)

## 2020-01-28 LAB — VITAMIN D 25 HYDROXY (VIT D DEFICIENCY, FRACTURES): Vit D, 25-Hydroxy: 16.5 ng/mL — ABNORMAL LOW (ref 30.0–100.0)

## 2020-01-28 NOTE — Progress Notes (Signed)
Pt reports to take Fenofibrate daily, with the exception of last week.  Pt did not take at all for a week while on vacation. Pt advised to d/c alcohol and tylenol.  Pt advised of pancreatitis symptoms.

## 2020-01-28 NOTE — Telephone Encounter (Signed)
Greggory Brandy returned your call, please try him again

## 2020-01-28 NOTE — Progress Notes (Signed)
Chief Complaint:   OBESITY Charles Diaz (MR# [id number]) is a 39 y.o. male who presents for evaluation and treatment of obesity and related comorbidities. Current BMI is Body mass index is 39.53 kg/m.Charles Diaz has been struggling with his weight for many years and has been unsuccessful in either losing weight, maintaining weight loss, or reaching his healthy weight goal.  Charles Diaz is currently in the action stage of change and ready to dedicate time achieving and maintaining a healthier weight. Charles Diaz is interested in becoming our patient and working on intensive lifestyle modifications including (but not limited to) diet and exercise for weight loss.  Charles Diaz does not like to cook. He dislikes vegetables and craves meats. He states that he is a picky eater. He does skip meals sometimes.  Charles Diaz's habits were reviewed today and are as follows: His family eats meals together, he thinks his family will eat healthier with him, his desired weight loss is 50 lbs, he started gaining weight 10 years ago, his heaviest weight ever was 260 pounds, he craves meats, he skips lunch sometimes, and he frequently eats larger portions than normal.  Depression Screen Charles Diaz's Food and Mood (modified PHQ-9) score was 3.  Depression screen [facility] 2/9 01/27/2020  Decreased Interest 0  Down, Depressed, Hopeless 0  PHQ - 2 Score 0  Altered sleeping 1  Tired, decreased energy 1  Change in appetite 0  Feeling bad or failure about yourself  0  Trouble concentrating 1  Moving slowly or fidgety/restless 0  Suicidal thoughts 0  PHQ-9 Score 3  Difficult doing work/chores Not difficult at all   Subjective:   Other fatigue. [name] denies daytime somnolence and admits to waking up still tired. Charles Diaz generally gets 7 hours of sleep per night, and states that he has generally restful sleep sometimes. Snoring is present. Apneic episodes are not present. Epworth Sleepiness Score is 2.  SOB (shortness of breath) on exertion. Charles Diaz  notes increasing shortness of breath with certain activities and seems to be worsening over time with weight gain. He notes getting out of breath sooner with activity than he used to. This has not gotten worse recently. Charles Diaz denies shortness of breath at rest or orthopnea.  Dyslipidemia. Charles Diaz is taking fenofibrate. Triglycerides were 431 on 03/27/2019.  Vitamin D deficiency. Charles Diaz is not on Vitamin D or supplements.  Depression screening. Charles Diaz had a negative depression screen with a PHQ-9 score of 3.  At risk for heart disease. Charles Diaz is at a higher than average risk for cardiovascular disease due to elevated triglycerides and obesity.   Assessment/Plan:   Other fatigue. Charles Diaz does feel that his weight is causing his energy to be lower than it should be. Fatigue may be related to obesity, depression or many other causes. Labs will be ordered, and in the meanwhile, [name] will focus on self care including making healthy food choices, increasing physical activity and focusing on stress reduction. EKG 12-Lead, Comprehensive metabolic panel, Hemoglobin A1c, Insulin, random, T3, T4, free, TSH studies ordered today.  SOB (shortness of breath) on exertion. Charles Diaz does not feel that he gets out of breath more easily that he used to when he exercises. Charles Diaz's shortness of breath appears to be obesity related and exercise induced. He has agreed to work on weight loss and gradually increase exercise to treat his exercise induced shortness of breath. Will continue to monitor closely. Lipid Panel With LDL/HDL Ratio will be checked today.  Dyslipidemia. Charles Diaz will continue his medication  as directed. Lipid panel will be checked today.  Vitamin D deficiency. Low Vitamin D level contributes to fatigue and are associated with obesity, breast, and colon cancer. VITAMIN D 25 Hydroxy (Vit-D Deficiency, Fractures) level will be checked today.  Depression screening. Depression screening was negative.  At risk for heart  disease. Charles Diaz was given approximately 15 minutes of coronary artery disease prevention counseling today. He is 39 y.o. male and has risk factors for heart disease including obesity. We discussed intensive lifestyle modifications today with an emphasis on specific weight loss instructions and strategies.   Repetitive spaced learning was employed today to elicit superior memory formation and behavioral change.  Class 2 severe obesity with serious comorbidity and body mass index (BMI) of 39.0 to 39.9 in adult, unspecified obesity type (HCC).  [name] is currently in the action stage of change and his goal is to continue with weight loss efforts. I recommend Charles Diaz begin the structured treatment plan as follows:  He has agreed to the Category 2 Plan.  He will work on meal planning and increasing his water intake.  We independently reviewed with the patient labs from 01/04/2019 including CMP, CBC, and glucose; 03/27/2019 including AST, ALT, lipid panel, and TSH.  Exercise goals: All adults should avoid inactivity. Some physical activity is better than none, and adults who participate in any amount of physical activity gain some health benefits.   Behavioral modification strategies: increasing lean protein intake, decreasing simple carbohydrates, increasing vegetables, increasing water intake, decreasing eating out, no skipping meals, meal planning and cooking strategies, keeping healthy foods in the home and planning for success.  He was informed of the importance of frequent follow-up visits to maximize his success with intensive lifestyle modifications for his multiple health conditions. He was informed we would discuss his lab results at his next visit unless there is a critical issue that needs to be addressed sooner. Charles Diaz agreed to keep his next visit at the agreed upon time to discuss these results.  Objective:   Blood pressure 110/76, pulse 64, temperature 98.5 F (36.9 C), height 5\' 8"  (1.727  m), weight 260 lb (117.9 kg), SpO2 97 %. Body mass index is 39.53 kg/m.  EKG: Normal sinus rhythm, rate 70 BPM. Within normal limits.  Indirect Calorimeter completed today shows a VO2 of 246 and a REE of 1712.  His calculated basal metabolic rate is thus his basal metabolic rate is worse than expected.  General: Cooperative, alert, well developed, in no acute distress. HEENT: Conjunctivae and lids unremarkable. Cardiovascular: Regular rhythm.  Lungs: Normal work of breathing. Neurologic: No focal deficits.   Lab Results  Component Value Date   CREATININE 0.98 01/27/2020   BUN 16 01/27/2020   NA 138 01/27/2020   K 4.1 01/27/2020   CL 104 01/27/2020   CO2 22 01/27/2020   Lab Results  Component Value Date   ALT 108 (H) 01/27/2020   AST 59 (H) 01/27/2020   ALKPHOS 114 01/27/2020   BILITOT 0.6 01/27/2020   Lab Results  Component Value Date   HGBA1C 5.4 01/27/2020   HGBA1C 5.3 02/23/2018   Lab Results  Component Value Date   INSULIN 14.4 01/27/2020   Lab Results  Component Value Date   TSH 1.800 01/27/2020   Lab Results  Component Value Date   CHOL 222 (H) 01/27/2020   HDL 25 (L) 01/27/2020   LDLCALC 101 (H) 01/27/2020   LDLDIRECT 56.0 03/27/2019   TRIG 566 (HH) 01/27/2020   CHOLHDL 6  03/27/2019   Lab Results  Component Value Date   WBC 6.1 01/04/2019   HGB 14.1 01/04/2019   HCT 41.5 01/04/2019   MCV 85.4 01/04/2019   PLT 159.0 01/04/2019   Lab Results  Component Value Date   FERRITIN 450 (H) 12/16/2018   Attestation Statements:   Reviewed by clinician on day of visit: allergies, medications, problem list, medical history, surgical history, family history, social history, and previous encounter notes.  Fernanda Drum, am acting as Energy manager for [employer], DO   I have reviewed the above documentation for accuracy and completeness, and I agree with the above. Corinna Capra, DO

## 2020-01-28 NOTE — Progress Notes (Signed)
Left vmail for pt to return cal re: labs

## 2020-01-28 NOTE — Telephone Encounter (Signed)
Call retruned

## 2020-02-10 ENCOUNTER — Encounter (INDEPENDENT_AMBULATORY_CARE_PROVIDER_SITE_OTHER): Payer: Self-pay | Admitting: Bariatrics

## 2020-02-10 ENCOUNTER — Ambulatory Visit (INDEPENDENT_AMBULATORY_CARE_PROVIDER_SITE_OTHER): Payer: BC Managed Care – PPO | Admitting: Bariatrics

## 2020-02-10 ENCOUNTER — Other Ambulatory Visit: Payer: Self-pay

## 2020-02-10 VITALS — BP 114/80 | HR 64 | Temp 98.2°F | Ht 68.0 in | Wt 249.0 lb

## 2020-02-10 DIAGNOSIS — E559 Vitamin D deficiency, unspecified: Secondary | ICD-10-CM

## 2020-02-10 DIAGNOSIS — E8881 Metabolic syndrome: Secondary | ICD-10-CM

## 2020-02-10 DIAGNOSIS — E781 Pure hyperglyceridemia: Secondary | ICD-10-CM | POA: Diagnosis not present

## 2020-02-10 DIAGNOSIS — Z9189 Other specified personal risk factors, not elsewhere classified: Secondary | ICD-10-CM

## 2020-02-10 DIAGNOSIS — R7989 Other specified abnormal findings of blood chemistry: Secondary | ICD-10-CM

## 2020-02-10 DIAGNOSIS — Z6838 Body mass index (BMI) 38.0-38.9, adult: Secondary | ICD-10-CM

## 2020-02-10 MED ORDER — VITAMIN D (ERGOCALCIFEROL) 1.25 MG (50000 UNIT) PO CAPS: 50000.0000 [IU] | ORAL_CAPSULE | ORAL | 0 refills | Status: DC

## 2020-02-10 NOTE — Progress Notes (Signed)
Chief Complaint:   OBESITY Charles Diaz is here to discuss his progress with his obesity treatment plan along with follow-up of his obesity related diagnoses. Charles Diaz is on the Category 2 Plan and states he is following his eating plan approximately 95% of the time. Charles Diaz states he is playing soccer 60-120 minutes 2 times per week.  Today's visit was #: 2 Starting weight: 260 lbs Starting date: 01/27/2020 Today's weight: 249 lbs Today's date: 02/10/2020 Total lbs lost to date: 11 Total lbs lost since last in-office visit: 11  Interim History: Charles Diaz is down 11 lbs and doing well overall. He states the meal plan is a change and different from "what I had been eating."  Subjective:   Hypertriglyceridemia. We spoke with the patient and he had mixed doses of fenofibrate.   Ref. Range 01/27/2020 10:47  Triglycerides Latest Ref Range: 0 - 149 mg/dL 536 (HH)   Insulin resistance. Kimoni has a diagnosis of insulin resistance based on his elevated fasting insulin level >5. He continues to work on diet and exercise to decrease his risk of diabetes. No polyphagia.  Lab Results  Component Value Date   INSULIN 14.4 01/27/2020   Lab Results  Component Value Date   HGBA1C 5.4 01/27/2020   Vitamin D deficiency. Charles Diaz is taking a multivitamin and Vitamin D supplementation.    Ref. Range 01/27/2020 10:47  Vitamin D, 25-Hydroxy Latest Ref Range: 30.0 - 100.0 ng/mL 16.5 (L)   Elevated LFTs. Charles Diaz has a new dx of elevated ALT. He denies abdominal pain or jaundice and has never been told of any liver problems in the past. He denies excessive alcohol intake. Charles Diaz has not had an ultrasound. He denies Tylenol use on a regular basis and has rare ETOH.  Lab Results  Component Value Date   ALT 108 (H) 01/27/2020   AST 59 (H) 01/27/2020   ALKPHOS 114 01/27/2020   BILITOT 0.6 01/27/2020   At risk for osteoporosis. Charles Diaz is at higher risk of osteopenia and osteoporosis due to Vitamin D deficiency.    Assessment/Plan:   Hypertriglyceridemia. Add will continue fenofibrate as directed and will not miss any doses.  Insulin resistance. Kesler will continue to work on weight loss, increasing activities, increasing healthy fats and protein, and decreasing simple carbohydrates to help decrease the risk of diabetes. Charles Diaz agreed to follow-up with [place] as directed to closely monitor his progress.  Vitamin D deficiency. Low Vitamin D level contributes to fatigue and are associated with obesity, breast, and colon cancer. He was given a prescription for Vitamin D, Ergocalciferol, (DRISDOL) 1.25 MG (50000 UNIT) CAPS capsule every week #4 with 0 refills and will follow-up for routine testing of Vitamin D, at least 2-3 times per year to avoid over-replacement.   Elevated LFTs. We discussed the likely diagnosis of non-alcoholic fatty liver disease today and how this condition is obesity related. Charles Diaz was educated the importance of weight loss. Charles Diaz agreed to continue with his weight loss efforts with healthier diet and exercise as an essential part of his treatment plan. We will watch over time. Charles Diaz was advised to avoid Tylenol and limit ETOH use. Will recheck labs in approximately 1 month.  At risk for osteoporosis. Charles Diaz was given approximately 15 minutes of osteoporosis prevention counseling today. Charles Diaz is at risk for osteopenia and osteoporosis due to his Vitamin D deficiency. He was encouraged to take his Vitamin D and follow his higher calcium diet and increase strengthening exercise to help strengthen  his bones and decrease his risk of osteopenia and osteoporosis.  Repetitive spaced learning was employed today to elicit superior memory formation and behavioral change.  Class 2 severe obesity with serious comorbidity and body mass index (BMI) of 38.0 to 38.9 in adult, unspecified obesity type (HCC).  Charles Diaz is currently in the action stage of change. As such, his goal is to continue with weight loss efforts.  He has agreed to the Category 2 Plan.   He will work on meal planning, intentional eating, and continue weighing his food.  Handout was provided on Eating Out.  We reviewed with the patient labs from 01/21/2020 including CMP, lipids, Vitamin D, A1c, insulin, and thyroid profile.  Exercise goals: Charles Diaz will continue his current exercise regimen.   Behavioral modification strategies: increasing lean protein intake, decreasing simple carbohydrates, increasing vegetables, increasing water intake, decreasing eating out, no skipping meals, meal planning and cooking strategies, keeping healthy foods in the home and planning for success.  Charles Diaz has agreed to follow-up with our clinic in 2 weeks. He was informed of the importance of frequent follow-up visits to maximize his success with intensive lifestyle modifications for his multiple health conditions.   Objective:   Blood pressure 114/80, pulse 64, temperature 98.2 F (36.8 C), height 5\' 8"  (1.727 m), weight 249 lb (112.9 kg), SpO2 98 %. Body mass index is 37.86 kg/m.  General: Cooperative, alert, well developed, in no acute distress. HEENT: Conjunctivae and lids unremarkable. Cardiovascular: Regular rhythm.  Lungs: Normal work of breathing. Neurologic: No focal deficits.   Lab Results  Component Value Date   CREATININE 0.98 01/27/2020   BUN 16 01/27/2020   NA 138 01/27/2020   K 4.1 01/27/2020   CL 104 01/27/2020   CO2 22 01/27/2020   Lab Results  Component Value Date   ALT 108 (H) 01/27/2020   AST 59 (H) 01/27/2020   ALKPHOS 114 01/27/2020   BILITOT 0.6 01/27/2020   Lab Results  Component Value Date   HGBA1C 5.4 01/27/2020   HGBA1C 5.3 02/23/2018   Lab Results  Component Value Date   INSULIN 14.4 01/27/2020   Lab Results  Component Value Date   TSH 1.800 01/27/2020   Lab Results  Component Value Date   CHOL 222 (H) 01/27/2020   HDL 25 (L) 01/27/2020   LDLCALC 101 (H) 01/27/2020   LDLDIRECT 56.0 03/27/2019    TRIG 566 (HH) 01/27/2020   CHOLHDL 6 03/27/2019   Lab Results  Component Value Date   WBC 6.1 01/04/2019   HGB 14.1 01/04/2019   HCT 41.5 01/04/2019   MCV 85.4 01/04/2019   PLT 159.0 01/04/2019   Lab Results  Component Value Date   FERRITIN 450 (H) 12/16/2018   Attestation Statements:   Reviewed by clinician on day of visit: allergies, medications, problem list, medical history, surgical history, family history, social history, and previous encounter notes.  12/18/2018, am acting as Fernanda Drum for Energy manager, DO   I have reviewed the above documentation for accuracy and completeness, and I agree with the above. [employer], DO

## 2020-02-27 DIAGNOSIS — Z20828 Contact with and (suspected) exposure to other viral communicable diseases: Secondary | ICD-10-CM | POA: Diagnosis not present

## 2020-03-02 ENCOUNTER — Other Ambulatory Visit: Payer: Self-pay

## 2020-03-02 ENCOUNTER — Ambulatory Visit (INDEPENDENT_AMBULATORY_CARE_PROVIDER_SITE_OTHER): Payer: BC Managed Care – PPO | Admitting: Bariatrics

## 2020-03-02 ENCOUNTER — Encounter (INDEPENDENT_AMBULATORY_CARE_PROVIDER_SITE_OTHER): Payer: Self-pay | Admitting: Bariatrics

## 2020-03-02 VITALS — BP 125/84 | HR 62 | Temp 97.7°F | Ht 68.0 in | Wt 251.0 lb

## 2020-03-02 DIAGNOSIS — E782 Mixed hyperlipidemia: Secondary | ICD-10-CM | POA: Diagnosis not present

## 2020-03-02 DIAGNOSIS — Z6838 Body mass index (BMI) 38.0-38.9, adult: Secondary | ICD-10-CM

## 2020-03-02 DIAGNOSIS — E559 Vitamin D deficiency, unspecified: Secondary | ICD-10-CM | POA: Diagnosis not present

## 2020-03-02 DIAGNOSIS — Z9189 Other specified personal risk factors, not elsewhere classified: Secondary | ICD-10-CM

## 2020-03-02 MED ORDER — VITAMIN D (ERGOCALCIFEROL) 1.25 MG (50000 UNIT) PO CAPS: 50000.0000 [IU] | ORAL_CAPSULE | ORAL | 0 refills | Status: DC

## 2020-03-04 NOTE — Progress Notes (Signed)
Chief Complaint:   OBESITY Charles Diaz is here to discuss his progress with his obesity treatment plan along with follow-up of his obesity related diagnoses. Charles Diaz is on the Category 2 Plan and states he is following his eating plan approximately 80% of the time. Charles Diaz states he is playing soccer for 45 minutes 2 times per week.  Today's visit was #: 3 Starting weight: 260 lbs Starting date: 01/27/2020 Today's weight: 251 lbs Today's date: 03/02/2020 Total lbs lost to date: 9 lbs Total lbs lost since last in-office visit: 0  Interim History: Charles Diaz is up 2 pounds, but has done well overall.  He had a sinus infection last week.  He was more hungry last week.  Subjective:   1. Elevated triglycerides with high cholesterol Charles Diaz has hypertriglyceridemia and has been trying to improve his cholesterol levels with intensive lifestyle modification including a low saturated fat diet, exercise and weight loss. He denies any chest pain, claudication or myalgias.  He is taking fenofibrate.  Lab Results  Component Value Date   ALT 108 (H) 01/27/2020   AST 59 (H) 01/27/2020   ALKPHOS 114 01/27/2020   BILITOT 0.6 01/27/2020   Lab Results  Component Value Date   CHOL 222 (H) 01/27/2020   HDL 25 (L) 01/27/2020   LDLCALC 101 (H) 01/27/2020   LDLDIRECT 56.0 03/27/2019   TRIG 566 (HH) 01/27/2020   CHOLHDL 6 03/27/2019   2. Vitamin D deficiency Charles Diaz Vitamin D level was 16.5 on 01/27/2020. He is currently taking prescription vitamin D 50,000 IU each week. He denies nausea, vomiting or muscle weakness.  3. At risk for heart disease Charles Diaz is at a higher than average risk for cardiovascular disease due to obesity and hypertryglyceridemia.   Assessment/Plan:   1. Elevated triglycerides with high cholesterol Cardiovascular risk and specific lipid/LDL goals reviewed.  We discussed several lifestyle modifications today and Charles Diaz will continue to work on diet, exercise and weight loss efforts. Orders and  follow up as documented in patient record.  Continue medication.  Plan to reduce LFTs and lipids in the near future.  Counseling Intensive lifestyle modifications are the first line treatment for this issue.  Dietary changes: Increase soluble fiber. Decrease simple carbohydrates.  Exercise changes: Moderate to vigorous-intensity aerobic activity 150 minutes per week if tolerated.  Lipid-lowering medications: see documented in medical record.  2. Vitamin D deficiency Low Vitamin D level contributes to fatigue and are associated with obesity, breast, and colon cancer. He agrees to continue to take prescription Vitamin D @50 ,000 IU every week and will follow-up for routine testing of Vitamin D, at least 2-3 times per year to avoid over-replacement.   -Refill Vitamin D, Ergocalciferol, (DRISDOL) 1.25 MG (50000 UNIT) CAPS capsule; Take 1 capsule (50,000 Units total) by mouth every 7 (seven) days.  Dispense: 4 capsule; Refill: 0  3. At risk for heart disease Charles Diaz was given approximately 15 minutes of coronary artery disease prevention counseling today. He is 39 y.o. male and has risk factors for heart disease including obesity. We discussed intensive lifestyle modifications today with an emphasis on specific weight loss instructions and strategies.   Repetitive spaced learning was employed today to elicit superior memory formation and behavioral change.  4. Class 2 severe obesity with serious comorbidity and body mass index (BMI) of 38.0 to 38.9 in adult, unspecified obesity type Charles Diaz) Charles Diaz is currently in the action stage of change. As such, his goal is to continue with weight loss efforts. He has  agreed to the Category 2 Plan.   He will adhere more closely to the plan and increase his water intake.  Exercise goals: Daily exercise (stationary bike).  Will start 15-20 minutes per day.  Behavioral modification strategies: increasing lean protein intake, decreasing simple carbohydrates,  increasing vegetables, increasing water intake, decreasing eating out, no skipping meals, meal planning and cooking strategies, keeping healthy foods in the home and planning for success.  Charles Diaz has agreed to follow-up with our clinic in 3 weeks. He was informed of the importance of frequent follow-up visits to maximize his success with intensive lifestyle modifications for his multiple health conditions.   Objective:   Blood pressure 125/84, pulse 62, temperature 97.7 F (36.5 C), height 5\' 8"  (1.727 m), weight 251 lb (113.9 kg), SpO2 95 %. Body mass index is 38.16 kg/m.  General: Cooperative, alert, well developed, in no acute distress. HEENT: Conjunctivae and lids unremarkable. Cardiovascular: Regular rhythm.  Lungs: Normal work of breathing. Neurologic: No focal deficits.   Lab Results  Component Value Date   CREATININE 0.98 01/27/2020   BUN 16 01/27/2020   NA 138 01/27/2020   K 4.1 01/27/2020   CL 104 01/27/2020   CO2 22 01/27/2020   Lab Results  Component Value Date   ALT 108 (H) 01/27/2020   AST 59 (H) 01/27/2020   ALKPHOS 114 01/27/2020   BILITOT 0.6 01/27/2020   Lab Results  Component Value Date   HGBA1C 5.4 01/27/2020   HGBA1C 5.3 02/23/2018   Lab Results  Component Value Date   INSULIN 14.4 01/27/2020   Lab Results  Component Value Date   TSH 1.800 01/27/2020   Lab Results  Component Value Date   CHOL 222 (H) 01/27/2020   HDL 25 (L) 01/27/2020   LDLCALC 101 (H) 01/27/2020   LDLDIRECT 56.0 03/27/2019   TRIG 566 (HH) 01/27/2020   CHOLHDL 6 03/27/2019   Lab Results  Component Value Date   WBC 6.1 01/04/2019   HGB 14.1 01/04/2019   HCT 41.5 01/04/2019   MCV 85.4 01/04/2019   PLT 159.0 01/04/2019   Lab Results  Component Value Date   FERRITIN 450 (H) 12/16/2018   Attestation Statements:   Reviewed by clinician on day of visit: allergies, medications, problem list, medical history, surgical history, family history, social history, and previous  encounter notes.  I, 12/18/2018, CMA, am acting as Insurance claims handler for Energy manager, DO  I have reviewed the above documentation for accuracy and completeness, and I agree with the above. [employer], DO

## 2020-03-05 ENCOUNTER — Encounter (INDEPENDENT_AMBULATORY_CARE_PROVIDER_SITE_OTHER): Payer: Self-pay | Admitting: Bariatrics

## 2020-03-16 ENCOUNTER — Ambulatory Visit (INDEPENDENT_AMBULATORY_CARE_PROVIDER_SITE_OTHER): Payer: BC Managed Care – PPO | Admitting: Bariatrics

## 2020-03-16 ENCOUNTER — Encounter (INDEPENDENT_AMBULATORY_CARE_PROVIDER_SITE_OTHER): Payer: Self-pay | Admitting: Bariatrics

## 2020-03-16 ENCOUNTER — Other Ambulatory Visit: Payer: Self-pay

## 2020-03-16 VITALS — BP 115/79 | HR 65 | Temp 97.9°F | Ht 68.0 in | Wt 246.0 lb

## 2020-03-16 DIAGNOSIS — E782 Mixed hyperlipidemia: Secondary | ICD-10-CM | POA: Diagnosis not present

## 2020-03-16 DIAGNOSIS — E559 Vitamin D deficiency, unspecified: Secondary | ICD-10-CM | POA: Diagnosis not present

## 2020-03-16 DIAGNOSIS — Z9189 Other specified personal risk factors, not elsewhere classified: Secondary | ICD-10-CM | POA: Diagnosis not present

## 2020-03-16 DIAGNOSIS — R748 Abnormal levels of other serum enzymes: Secondary | ICD-10-CM | POA: Diagnosis not present

## 2020-03-16 DIAGNOSIS — Z6837 Body mass index (BMI) 37.0-37.9, adult: Secondary | ICD-10-CM | POA: Diagnosis not present

## 2020-03-16 DIAGNOSIS — E8881 Metabolic syndrome: Secondary | ICD-10-CM | POA: Diagnosis not present

## 2020-03-17 LAB — COMPREHENSIVE METABOLIC PANEL
ALT: 46 IU/L — ABNORMAL HIGH (ref 0–44)
AST: 24 IU/L (ref 0–40)
Albumin/Globulin Ratio: 1.7 (ref 1.2–2.2)
Albumin: 4.2 g/dL (ref 4.0–5.0)
Alkaline Phosphatase: 119 IU/L (ref 44–121)
BUN/Creatinine Ratio: 12 (ref 9–20)
BUN: 13 mg/dL (ref 6–20)
Bilirubin Total: 0.4 mg/dL (ref 0.0–1.2)
CO2: 24 mmol/L (ref 20–29)
Calcium: 9.4 mg/dL (ref 8.7–10.2)
Chloride: 106 mmol/L (ref 96–106)
Creatinine, Ser: 1.05 mg/dL (ref 0.76–1.27)
GFR calc Af Amer: 104 mL/min/{1.73_m2} (ref >59)
GFR calc non Af Amer: 90 mL/min/{1.73_m2} (ref >59)
Globulin, Total: 2.5 g/dL (ref 1.5–4.5)
Glucose: 95 mg/dL (ref 65–99)
Potassium: 3.9 mmol/L (ref 3.5–5.2)
Sodium: 145 mmol/L — ABNORMAL HIGH (ref 134–144)
Total Protein: 6.7 g/dL (ref 6.0–8.5)

## 2020-03-17 LAB — LIPID PANEL WITH LDL/HDL RATIO
Cholesterol, Total: 141 mg/dL (ref 100–199)
HDL: 32 mg/dL — ABNORMAL LOW (ref >39)
LDL Chol Calc (NIH): 78 mg/dL (ref 0–99)
LDL/HDL Ratio: 2.4 ratio (ref 0.0–3.6)
Triglycerides: 182 mg/dL — ABNORMAL HIGH (ref 0–149)
VLDL Cholesterol Cal: 31 mg/dL (ref 5–40)

## 2020-03-17 LAB — VITAMIN D 25 HYDROXY (VIT D DEFICIENCY, FRACTURES): Vit D, 25-Hydroxy: 42.3 ng/mL (ref 30.0–100.0)

## 2020-03-17 LAB — INSULIN, RANDOM: INSULIN: 15.4 u[IU]/mL (ref 2.6–24.9)

## 2020-03-17 NOTE — Progress Notes (Signed)
Chief Complaint:   OBESITY Charles Diaz is here to discuss his progress with his obesity treatment plan along with follow-up of his obesity related diagnoses. Charles Diaz is on the Category 2 Plan and states he is following his eating plan approximately 85% of the time. Charles Diaz states he is playing for 30 minutes 2 times per week and biking for 20 minutes 2 times per week.  Today's visit was #: 4 Starting weight: 260 lbs Starting date: 01/27/2020 Today's weight: 246 lbs Today's date: 03/16/2020 Total lbs lost to date: 14 lbs Total lbs lost since last in-office visit: 5 lbs  Interim History: Charles Diaz is down 5 pounds and doing well overall.  Subjective:   1. Vitamin D deficiency Charles Diaz Vitamin D level was 16.5 on 01/27/2020. He is currently taking prescription vitamin D 50,000 IU each week. He denies nausea, vomiting or muscle weakness.  2. Elevated triglycerides with high cholesterol Charles Diaz has hyperlipidemia and has been trying to improve his cholesterol levels with intensive lifestyle modification including a low saturated fat diet, exercise and weight loss. He denies any chest pain, claudication or myalgias.  He is taking fenofibrate.  Lab Results  Component Value Date   ALT 46 (H) 03/16/2020   AST 24 03/16/2020   ALKPHOS 119 03/16/2020   BILITOT 0.4 03/16/2020   Lab Results  Component Value Date   CHOL 141 03/16/2020   HDL 32 (L) 03/16/2020   LDLCALC 78 03/16/2020   LDLDIRECT 56.0 03/27/2019   TRIG 182 (H) 03/16/2020   CHOLHDL 6 03/27/2019   3. At risk for impaired function of liver Charles Diaz is at risk for impaired function of liver due to elevated triglycerides and obesity.  Assessment/Plan:   1. Vitamin D deficiency Low Vitamin D level contributes to fatigue and are associated with obesity, breast, and colon cancer. He agrees to continue to take prescription Vitamin D @50 ,000 IU every week and will follow-up for routine testing of Vitamin D, at least 2-3 times per year to avoid  over-replacement.  Will refill vitamin D today and check vitamin D level today.  - VITAMIN D 25 Hydroxy (Vit-D Deficiency, Fractures)  2. Elevated triglycerides with high cholesterol Cardiovascular risk and specific lipid/LDL goals reviewed.  We discussed several lifestyle modifications today and Aeron will continue to work on diet, exercise and weight loss efforts. Orders and follow up as documented in patient record.  Check lipids and CMP today.  Counseling Intensive lifestyle modifications are the first line treatment for this issue. . Dietary changes: Increase soluble fiber. Decrease simple carbohydrates. . Exercise changes: Moderate to vigorous-intensity aerobic activity 150 minutes per week if tolerated. . Lipid-lowering medications: see documented in medical record.  - Comprehensive metabolic panel - Lipid Panel With LDL/HDL Ratio - Insulin, random  3. At risk for impaired function of liver Charles Diaz was given approximately 15 minutes of counseling today regarding prevention of impaired liver function. Charles Diaz was educated about his risk of developing NASH or even liver failure and advised that the only proven treatment for NAFLD was weight loss of at least 5-10% of body weight.   4. Class 2 severe obesity with serious comorbidity and body mass index (BMI) of 37.0 to 37.9 in adult, unspecified obesity type [facility])  Charles Diaz is currently in the action stage of change. As such, his goal is to continue with weight loss efforts. He has agreed to the Category 2 Plan.   He will work on meal planning and mindful eating.  Exercise goals: For substantial  health benefits, adults should do at least 150 minutes (2 hours and 30 minutes) a week of moderate-intensity, or 75 minutes (1 hour and 15 minutes) a week of vigorous-intensity aerobic physical activity, or an equivalent combination of moderate- and vigorous-intensity aerobic activity. Aerobic activity should be performed in episodes of at least 10 minutes,  and preferably, it should be spread throughout the week.  Behavioral modification strategies: increasing lean protein intake, decreasing simple carbohydrates, increasing vegetables, increasing water intake, decreasing eating out, no skipping meals, meal planning and cooking strategies and keeping healthy foods in the home.  Charles Diaz has agreed to follow-up with our clinic in 3 weeks. He was informed of the importance of frequent follow-up visits to maximize his success with intensive lifestyle modifications for his multiple health conditions.   Charles Diaz was informed we would discuss his lab results at his next visit unless there is a critical issue that needs to be addressed sooner. Charles Diaz agreed to keep his next visit at the agreed upon time to discuss these results.  Objective:   Blood pressure 115/79, pulse 65, temperature 97.9 F (36.6 C), height 5\' 8"  (1.727 m), weight 246 lb (111.6 kg), SpO2 98 %. Body mass index is 37.4 kg/m.  General: Cooperative, alert, well developed, in no acute distress. HEENT: Conjunctivae and lids unremarkable. Cardiovascular: Regular rhythm.  Lungs: Normal work of breathing. Neurologic: No focal deficits.   Lab Results  Component Value Date   CREATININE 1.05 03/16/2020   BUN 13 03/16/2020   NA 145 (H) 03/16/2020   K 3.9 03/16/2020   CL 106 03/16/2020   CO2 24 03/16/2020   Lab Results  Component Value Date   ALT 46 (H) 03/16/2020   AST 24 03/16/2020   ALKPHOS 119 03/16/2020   BILITOT 0.4 03/16/2020   Lab Results  Component Value Date   HGBA1C 5.4 01/27/2020   HGBA1C 5.3 02/23/2018   Lab Results  Component Value Date   INSULIN 15.4 03/16/2020   INSULIN 14.4 01/27/2020   Lab Results  Component Value Date   TSH 1.800 01/27/2020   Lab Results  Component Value Date   CHOL 141 03/16/2020   HDL 32 (L) 03/16/2020   LDLCALC 78 03/16/2020   LDLDIRECT 56.0 03/27/2019   TRIG 182 (H) 03/16/2020   CHOLHDL 6 03/27/2019   Lab Results  Component Value  Date   WBC 6.1 01/04/2019   HGB 14.1 01/04/2019   HCT 41.5 01/04/2019   MCV 85.4 01/04/2019   PLT 159.0 01/04/2019   Lab Results  Component Value Date   FERRITIN 450 (H) 12/16/2018   Attestation Statements:   Reviewed by clinician on day of visit: allergies, medications, problem list, medical history, surgical history, family history, social history, and previous encounter notes.  I, 12/18/2018, CMA, am acting as Insurance claims handler for Energy manager, DO  I have reviewed the above documentation for accuracy and completeness, and I agree with the above. [employer], DO

## 2020-03-17 NOTE — Progress Notes (Signed)
Pt advised.

## 2020-03-18 ENCOUNTER — Encounter (INDEPENDENT_AMBULATORY_CARE_PROVIDER_SITE_OTHER): Payer: Self-pay | Admitting: Bariatrics

## 2020-03-26 ENCOUNTER — Other Ambulatory Visit (INDEPENDENT_AMBULATORY_CARE_PROVIDER_SITE_OTHER): Payer: Self-pay | Admitting: Bariatrics

## 2020-03-26 DIAGNOSIS — E559 Vitamin D deficiency, unspecified: Secondary | ICD-10-CM

## 2020-04-03 ENCOUNTER — Encounter: Payer: Self-pay | Admitting: Internal Medicine

## 2020-04-03 ENCOUNTER — Other Ambulatory Visit: Payer: Self-pay

## 2020-04-03 ENCOUNTER — Ambulatory Visit (INDEPENDENT_AMBULATORY_CARE_PROVIDER_SITE_OTHER): Payer: BC Managed Care – PPO | Admitting: Internal Medicine

## 2020-04-03 VITALS — BP 117/78 | HR 65 | Temp 97.7°F | Resp 16 | Ht 68.0 in | Wt 247.5 lb

## 2020-04-03 DIAGNOSIS — Z23 Encounter for immunization: Secondary | ICD-10-CM

## 2020-04-03 DIAGNOSIS — M25561 Pain in right knee: Secondary | ICD-10-CM | POA: Diagnosis not present

## 2020-04-03 DIAGNOSIS — Z0000 Encounter for general adult medical examination without abnormal findings: Secondary | ICD-10-CM | POA: Diagnosis not present

## 2020-04-03 DIAGNOSIS — M25562 Pain in left knee: Secondary | ICD-10-CM | POA: Diagnosis not present

## 2020-04-03 NOTE — Progress Notes (Signed)
Subjective:    Patient ID: Charles Diaz, male    DOB: [birth year], 39 y.o.   MRN: [id number]  DOS:  04/03/2020 Type of visit - description: CPX Since the last office visit he is doing well. He was working for home, gained weight. Recently went to the wellness clinic and is actually doing great, has lost several pounds already. He remains active  But c/o bilateral knee pain, request a referral  Wt Readings from Last 3 Encounters:  04/03/20 247 lb 8 oz (112.3 kg)  03/16/20 246 lb (111.6 kg)  03/02/20 251 lb (113.9 kg)    Review of Systems  Other than above, a 14 point review of systems is negative     Past Medical History:  Diagnosis Date  . Dyslipidemia   . High triglycerides     Past Surgical History:  Procedure Laterality Date  . KNEE ARTHROSCOPY Right 2009    Allergies as of 04/03/2020   No Known Allergies     Medication List       Accurate as of April 03, 2020 11:59 PM. If you have any questions, ask your nurse or doctor.        STOP taking these medications   Vitamin D (Ergocalciferol) 1.25 MG (50000 UNIT) Caps capsule Commonly known as: DRISDOL Stopped by: Willow Ora, MD     TAKE these medications   fenofibrate 160 MG tablet Take 1 tablet (160 mg total) by mouth daily.   Vitamin D 50 MCG (2000 UT) Caps Take 1 capsule (2,000 Units total) by mouth daily.          Objective:   Physical Exam BP 117/78 (BP Location: Left Arm, Patient Position: Sitting, Cuff Size: Small)   Pulse 65   Temp 97.7 F (36.5 C) (Oral)   Resp 16   Ht 5\' 8"  (1.727 m)   Wt 247 lb 8 oz (112.3 kg)   SpO2 96%   BMI 37.63 kg/m   General: Well developed, NAD, BMI noted Neck: No  thyromegaly  HEENT:  Normocephalic . Face symmetric, atraumatic Lungs:  CTA B Normal respiratory effort, no intercostal retractions, no accessory muscle use. Heart: RRR,  no murmur.  Abdomen:  Not distended, soft, non-tender. No rebound or rigidity.   Lower extremities: no pretibial  edema bilaterally  Skin: Exposed areas without rash. Not pale. Not jaundice Neurologic:  alert & oriented X3.  Speech normal, gait appropriate for age and unassisted Strength symmetric and appropriate for age.  Psych: Cognition and judgment appear intact.  Cooperative with normal attention span and concentration.  Behavior appropriate. No anxious or depressed appearing.     Assessment      Assessment Dyslipidemia , high TG L arm skin lesion, s/p Bx per derm before, no dx made, as off 8-17 decreasing in size per pt  Increase LFTs 2, hep B and C serology (-) 10/2016  PLAN:  Here for CPX, recent labs reviewed with the patient High triglycerides: On fenofibrate, they were elevated, changed  his diet, now TG  better.  RF prn Vitamin D: It was low, s/p ergocalciferol, levels improved,   recommend OTCs daily. Knee pain:   request referral, sent to sports medicine Increase LFTs Slightly increased LFTs recently, they improve on recheck, related to increased weight?11/2016 RTC 1 year CPX    This visit occurred during the SARS-CoV-2 public health emergency.  Safety protocols were in place, including screening questions prior to the visit, additional usage of staff PPE, and extensive cleaning  of exam room while observing appropriate contact time as indicated for disinfecting solutions.

## 2020-04-03 NOTE — Progress Notes (Signed)
Pre visit review using our clinic review tool, if applicable. No additional management support is needed unless otherwise documented below in the visit note. 

## 2020-04-03 NOTE — Patient Instructions (Addendum)
   GO TO THE FRONT DESK, PLEASE SCHEDULE YOUR APPOINTMENTS Come back for   a physical exam in 1 year 

## 2020-04-04 ENCOUNTER — Encounter: Payer: Self-pay | Admitting: Internal Medicine

## 2020-04-04 NOTE — Assessment & Plan Note (Signed)
Here for CPX, recent labs reviewed with the patient High triglycerides: On fenofibrate, they were elevated, changed  his diet, now TG  better.  RF prn Vitamin D: It was low, s/p ergocalciferol, levels improved,   recommend OTCs daily. Knee pain:   request referral, sent to sports medicine Increase LFTs Slightly increased LFTs recently, they improve on recheck, related to increased weight?Marland Kitchen RTC 1 year CPX

## 2020-04-04 NOTE — Assessment & Plan Note (Signed)
-  Tdap 2014 - s/p covid shot , booster optional - flu shot:  today -CCS: Never had a cscope -Labs:  Reviewed. -Diet and exercise: Due to the pandemia, he started to work from home and gaining weight.  He remains active playing soccer.  He is now working with the wellness clinic, is already losing weight.

## 2020-04-17 ENCOUNTER — Ambulatory Visit: Payer: BC Managed Care – PPO | Admitting: Family Medicine

## 2020-04-20 ENCOUNTER — Ambulatory Visit (INDEPENDENT_AMBULATORY_CARE_PROVIDER_SITE_OTHER): Payer: BC Managed Care – PPO | Admitting: Bariatrics

## 2020-04-24 NOTE — Progress Notes (Signed)
Subjective:    I'm seeing this patient as a consultation for:  Dr. Drue Novel. Note will be routed back to referring provider/PCP.  CC: B knee pain  I, Charles Diaz, LAT, ATC, am serving as scribe for Dr. Clementeen Graham.  HPI: Pt is a 39 y/o male presenting w/ c/o B knee pain, R >L, x couple years.  He locates his pain to his B lateral knees.  Pain is worse after playing soccer associated swelling.  Right is worse than left.  He does have a history of right lateral meniscus debridement 2009.  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  Radiating pain: no Knee swelling: yes after playing soccer Knee mechanical symptoms: yes in B knees Aggravating factors: playing soccer Treatments tried: ice after soccer; Tylenol  Past medical history, Surgical history, Family history, Social history, Allergies, and medications have been entered into the medical record, reviewed. Right lateral meniscus debridement, obesity  Review of Systems: No new headache, visual changes, nausea, vomiting, diarrhea, constipation, dizziness, abdominal pain, skin rash, fevers, chills, night sweats, weight loss, swollen lymph nodes, body aches, joint swelling, muscle aches, chest pain, shortness of breath, mood changes, visual or auditory hallucinations.   Objective:    Vitals:   04/27/20 0842  BP: 114/84  Pulse: 79  SpO2: 97%   General: Well Developed, well nourished, and in no acute distress.  Neuro/Psych: Alert and oriented x3, extra-ocular muscles intact, able to move all 4 extremities, sensation grossly intact. Skin: Warm and dry, no rashes noted.  Respiratory: Not using accessory muscles, speaking in full sentences, trachea midline.  Cardiovascular: Pulses palpable, no extremity edema. Abdomen: Does not appear distended. MSK: Right knee normal-appearing no effusion. Normal motion with crepitation. Nontender to palpation. Stable ligamentous exam. Negative Murray's test Intact  strength.  Left knee normal-appearing no effusion. Normal motion with crepitation. Nontender to palpation. Stable ligamentous exam. Negative McMurray's test. Intact strength.  Lab and Radiology Results  Diagnostic Limited MSK Ultrasound of: Right knee Quad tendon intact normal-appearing trace effusion superior patellar space Patellar tendon normal. Lateral joint line narrowed degenerative with partially absent lateral meniscus.  Meniscus is not extruded. Medial joint line narrowed degenerative with not extruded medial meniscus. Posterior knee no Baker's cyst Impression: DJD.  Status post meniscus debridement lateral  Diagnostic Limited MSK Ultrasound of: Left knee Quad tendon intact normal-appearing no effusion. Patella tendon normal-appearing Medial and lateral joint line mildly degenerative appearing with no obvious meniscus tear. Posterior knee no Baker's cyst. Impression: Mild DJD  X-ray images bilateral knee obtained today personally and independently interpreted.  Right knee: Mild moderate lateral compartment DJD.  Mild to moderate patellofemoral DJD.  Minimal medial DJD.  Left knee: Mild medial compartment DJD.  Otherwise normal-appearing knee  Await formal radiology review   Impression and Recommendations:    Assessment and Plan: 39 y.o. male with bilateral knee pain right worse than left due to degenerative changes.  Discussed options.  Weight loss will obviously help.  Lab topical diclofenac either Pennsaid or Voltaren gel.  Also will use compression sleeve during and following play.  If not improved would consider steroid or hyaluronic acid injection.  Recheck as needed.  PDMP not reviewed this encounter. Orders Placed This Encounter  Procedures  . [place] LIMITED JOINT SPACE STRUCTURES LOW BILAT(NO LINKED CHARGES)    Order Specific Question:   Reason for Exam (SYMPTOM  OR DIAGNOSIS REQUIRED)    Answer:   B knee pain  Order Specific Question:   Preferred imaging  location?    Answer:   Adult [facility]  . DG Knee AP/LAT W/Sunrise Left    Standing Status:   Future    Standing Expiration Date:   04/27/2021    Order Specific Question:   Reason for Exam (SYMPTOM  OR DIAGNOSIS REQUIRED)    Answer:   bl knee pain    Order Specific Question:   Preferred imaging location?    Answer:   [facility]  . DG Knee AP/LAT W/Sunrise Right    Standing Status:   Future    Standing Expiration Date:   04/27/2021    Order Specific Question:   Reason for Exam (SYMPTOM  OR DIAGNOSIS REQUIRED)    Answer:   eval knee pain    Order Specific Question:   Preferred imaging location?    Answer:   [facility]   Meds ordered this encounter  Medications  . Diclofenac Sodium (PENNSAID) 2 % SOLN    Sig: Place 1 application onto the skin 2 (two) times daily.    Dispense:  112 g    Refill:  2    Home Phone      [phone] Mobile          [phone]     Discussed warning signs or symptoms. Please see discharge instructions. Patient expresses understanding.   The above documentation has been reviewed and is accurate and complete Clementeen Graham, M.D.

## 2020-04-27 ENCOUNTER — Ambulatory Visit: Payer: Self-pay

## 2020-04-27 ENCOUNTER — Encounter: Payer: Self-pay | Admitting: Family Medicine

## 2020-04-27 ENCOUNTER — Ambulatory Visit (INDEPENDENT_AMBULATORY_CARE_PROVIDER_SITE_OTHER): Payer: BC Managed Care – PPO | Admitting: Family Medicine

## 2020-04-27 ENCOUNTER — Ambulatory Visit (INDEPENDENT_AMBULATORY_CARE_PROVIDER_SITE_OTHER): Payer: BC Managed Care – PPO

## 2020-04-27 ENCOUNTER — Other Ambulatory Visit: Payer: Self-pay

## 2020-04-27 VITALS — BP 114/84 | HR 79 | Ht 68.0 in | Wt 253.0 lb

## 2020-04-27 DIAGNOSIS — M25562 Pain in left knee: Secondary | ICD-10-CM | POA: Diagnosis not present

## 2020-04-27 DIAGNOSIS — M25561 Pain in right knee: Secondary | ICD-10-CM | POA: Diagnosis not present

## 2020-04-27 DIAGNOSIS — M1712 Unilateral primary osteoarthritis, left knee: Secondary | ICD-10-CM | POA: Diagnosis not present

## 2020-04-27 DIAGNOSIS — M1711 Unilateral primary osteoarthritis, right knee: Secondary | ICD-10-CM | POA: Diagnosis not present

## 2020-04-27 MED ORDER — PENNSAID 2 % EX SOLN: 1.0000 "application " | Freq: Two times a day (BID) | CUTANEOUS | 2 refills | Status: DC

## 2020-04-27 NOTE — Patient Instructions (Addendum)
Thank you for coming in today.  I recommend you obtained a compression sleeve to help with your joint problems. There are many options on the market however I recommend obtaining a full knee Body Helix compression sleeve.  You can find information (including how to appropriate measure yourself for sizing) can be found at [email or web address].  Many of these products are health savings account (HSA) eligible.   You can use the compression sleeve at any time throughout the day but is most important to use while being active as well as for 2 hours post-activity.   It is appropriate to ice following activity with the compression sleeve in place.  Plan also to use Pensaid or voltaren gel.   Pennsaid instructions: You have been given a sample/prescription for Pennsaid, a topical medication.     You are to apply this gel to your injured body part twice daily (morning and evening).   A little goes a long way so you can use about a pea-sized amount for each area.   Spread this small amount over the area into a thin film and let it dry.   Be sure that you do not rub the gel into your skin for more than 10 or 15 seconds otherwise it can irritate you skin.    Once you apply the gel, please do not put any other lotion or clothing in contact with that area for 30 minutes to allow the gel to absorb into your skin.   Some people are sensitive to the medication and can develop a sunburn-like rash.  If you have only mild symptoms it is okay to continue to use the medication but if you have any breakdown of your skin you should discontinue its use and please let us know.   If you have been written a prescription for Pennsaid, you will receive a pump bottle of this topical gel through a mail order pharmacy.  The instructions on the bottle will say to apply two pumps twice a day which may be too much gel for your particular area so use the pea-sized amount as your guide.  Instructions for Duexis, Pennsaid and  Vimovo:  Your prescription will be filled through a participating [facility] mail order pharmacy.  You will receive a phone call or text from one of the participating pharmacies which can be located in any state in the [place].  You must communicate directly with them to have this medication filled.  When the pharmacy contacts you, they will need your mailing address (for shipment of the medication) andy they will need payment information if you have a copay (typically no more than $10). If you have not heard from them 2-3 days after your appointment with Dr. Denyse Amass, contact [facility] directly at [phone].   Please get an Xray today before you leave   Weight loss would help.   Recheck if not improving.

## 2020-04-27 NOTE — Progress Notes (Signed)
X-ray left knee looks normal to radiology

## 2020-04-27 NOTE — Progress Notes (Signed)
X-ray right knee shows some mild arthritis worse at the lateral compartment where you had the meniscus surgery and some arthritis underneath the kneecap.

## 2020-04-30 ENCOUNTER — Ambulatory Visit (INDEPENDENT_AMBULATORY_CARE_PROVIDER_SITE_OTHER): Payer: BC Managed Care – PPO | Admitting: Bariatrics

## 2020-04-30 ENCOUNTER — Other Ambulatory Visit: Payer: Self-pay

## 2020-04-30 VITALS — BP 123/84 | HR 74 | Temp 98.4°F | Ht 68.0 in | Wt 243.0 lb

## 2020-04-30 DIAGNOSIS — Z6837 Body mass index (BMI) 37.0-37.9, adult: Secondary | ICD-10-CM

## 2020-04-30 DIAGNOSIS — E782 Mixed hyperlipidemia: Secondary | ICD-10-CM | POA: Diagnosis not present

## 2020-04-30 DIAGNOSIS — R7989 Other specified abnormal findings of blood chemistry: Secondary | ICD-10-CM

## 2020-04-30 DIAGNOSIS — E66812 Obesity, class 2: Secondary | ICD-10-CM

## 2020-04-30 NOTE — Progress Notes (Signed)
Chief Complaint:   OBESITY Charles Diaz is here to discuss his progress with his obesity treatment plan along with follow-up of his obesity related diagnoses. Charles Diaz is on the Category 2 Plan and states he is following his eating plan approximately 70% of the time. Charles Diaz states he is exercising on the stationary bike 20-25 minutes 2 times per week.  Today's visit was #: 5 Starting weight: 260 lbs Starting date: 01/27/2020 Today's weight: 243 lbs Today's date: 04/30/2020 Total lbs lost to date: 17 Total lbs lost since last in-office visit: 3  Interim History: Charles Diaz is down an additional 3 lbs since his last visit. He has had some knee pain and has not been as active.  Subjective:   Elevated triglycerides with high cholesterol. Charles Diaz is taking fenofibrate. Triglycerides were 182 from 566.  Elevated LFTs. Charles Diaz has a new dx of elevated ALT. His BMI is over 40. He denies abdominal pain or jaundice and has never been told of any liver problems in the past. He denies excessive alcohol intake. Charles Diaz denies abdominal pain. ALT 46 on 03/16/2020, which was decreased from previous readings.  Lab Results  Component Value Date   ALT 46 (H) 03/16/2020   AST 24 03/16/2020   ALKPHOS 119 03/16/2020   BILITOT 0.4 03/16/2020   Assessment/Plan:   Elevated triglycerides with high cholesterol. Charles Diaz will continue fenofibrate as directed.   Elevated LFTs. We discussed the likely diagnosis of non-alcoholic fatty liver disease today and how this condition is obesity related. Charles Diaz was educated the importance of weight loss. Charles Diaz agreed to continue with his weight loss efforts with healthier diet and exercise as an essential part of his treatment plan. Will follow LFT's.  Class 2 severe obesity with serious comorbidity and body mass index (BMI) of 37.0 to 37.9 in adult, unspecified obesity type (HCC).  Charles Diaz is currently in the action stage of change. As such, his goal is to continue with weight loss  efforts. He has agreed to the Category 2 Plan.   He will continue to adhere closely to the plan, will practice mindful eating, and will increase his water intake.   Exercise goals: Charles Diaz is having knee pain and will use the sleeve.  Behavioral modification strategies: increasing lean protein intake, decreasing simple carbohydrates, increasing vegetables, increasing water intake, decreasing eating out, no skipping meals, meal planning and cooking strategies, keeping healthy foods in the home and planning for success.  Charles Diaz has agreed to follow-up with our clinic in 3 weeks. He was informed of the importance of frequent follow-up visits to maximize his success with intensive lifestyle modifications for his multiple health conditions.   Objective:   Blood pressure 123/84, pulse 74, temperature 98.4 F (36.9 C), height 5\' 8"  (1.727 m), weight 243 lb (110.2 kg), SpO2 96 %. Body mass index is 36.95 kg/m.  General: Cooperative, alert, well developed, in no acute distress. HEENT: Conjunctivae and lids unremarkable. Cardiovascular: Regular rhythm.  Lungs: Normal work of breathing. Neurologic: No focal deficits.   Lab Results  Component Value Date   CREATININE 1.05 03/16/2020   BUN 13 03/16/2020   NA 145 (H) 03/16/2020   K 3.9 03/16/2020   CL 106 03/16/2020   CO2 24 03/16/2020   Lab Results  Component Value Date   ALT 46 (H) 03/16/2020   AST 24 03/16/2020   ALKPHOS 119 03/16/2020   BILITOT 0.4 03/16/2020   Lab Results  Component Value Date   HGBA1C 5.4 01/27/2020  HGBA1C 5.3 02/23/2018   Lab Results  Component Value Date   INSULIN 15.4 03/16/2020   INSULIN 14.4 01/27/2020   Lab Results  Component Value Date   TSH 1.800 01/27/2020   Lab Results  Component Value Date   CHOL 141 03/16/2020   HDL 32 (L) 03/16/2020   LDLCALC 78 03/16/2020   LDLDIRECT 56.0 03/27/2019   TRIG 182 (H) 03/16/2020   CHOLHDL 6 03/27/2019   Lab Results  Component Value Date   WBC 6.1  01/04/2019   HGB 14.1 01/04/2019   HCT 41.5 01/04/2019   MCV 85.4 01/04/2019   PLT 159.0 01/04/2019   Lab Results  Component Value Date   FERRITIN 450 (H) 12/16/2018   Attestation Statements:   Reviewed by clinician on day of visit: allergies, medications, problem list, medical history, surgical history, family history, social history, and previous encounter notes.  Time spent on visit including pre-visit chart review and post-visit charting and care was 20 minutes.   Fernanda Drum, am acting as Energy manager for [employer], DO   I have reviewed the above documentation for accuracy and completeness, and I agree with the above. Corinna Capra, DO

## 2020-05-04 ENCOUNTER — Encounter (INDEPENDENT_AMBULATORY_CARE_PROVIDER_SITE_OTHER): Payer: Self-pay | Admitting: Bariatrics

## 2020-05-21 ENCOUNTER — Ambulatory Visit (INDEPENDENT_AMBULATORY_CARE_PROVIDER_SITE_OTHER): Payer: BC Managed Care – PPO | Admitting: Bariatrics

## 2020-06-03 ENCOUNTER — Encounter (INDEPENDENT_AMBULATORY_CARE_PROVIDER_SITE_OTHER): Payer: Self-pay | Admitting: Bariatrics

## 2020-06-03 ENCOUNTER — Ambulatory Visit (INDEPENDENT_AMBULATORY_CARE_PROVIDER_SITE_OTHER): Payer: BC Managed Care – PPO | Admitting: Bariatrics

## 2020-06-03 ENCOUNTER — Other Ambulatory Visit: Payer: Self-pay

## 2020-06-03 VITALS — BP 121/88 | HR 73 | Temp 98.1°F | Ht 68.0 in | Wt 245.0 lb

## 2020-06-03 DIAGNOSIS — E559 Vitamin D deficiency, unspecified: Secondary | ICD-10-CM

## 2020-06-03 DIAGNOSIS — E782 Mixed hyperlipidemia: Secondary | ICD-10-CM | POA: Diagnosis not present

## 2020-06-03 DIAGNOSIS — Z6837 Body mass index (BMI) 37.0-37.9, adult: Secondary | ICD-10-CM

## 2020-06-08 ENCOUNTER — Encounter (INDEPENDENT_AMBULATORY_CARE_PROVIDER_SITE_OTHER): Payer: Self-pay | Admitting: Bariatrics

## 2020-06-08 NOTE — Progress Notes (Signed)
Chief Complaint:   Charles Diaz is here to discuss his progress with his Charles treatment plan along with follow-up of his Charles related diagnoses. Zerek is on the Category 2 Plan and states he is following his eating plan approximately 50% of the time. Tj states he is playing soccer 30 minutes 2 times per week.  Today's visit was #: 6 Starting weight: 260 lbs Starting date: 01/27/2020 Today's weight: 245 lbs Today's date: 06/03/2020 Total lbs lost to date: 15 Total lbs lost since last in-office visit: 0  Interim History: Charles Diaz is up 2 lbs but doing well overall. He has had more celebrations and activities.  Subjective:   Elevated triglycerides with high cholesterol. [name] is taking fenofibrate, which he is tolerating well.  Vitamin D deficiency. [name] reports minimal sunlight exposure. Level is currently well controlled.   Ref. Range 03/16/2020 08:26  Vitamin D, 25-Hydroxy Latest Ref Range: 30.0 - 100.0 ng/mL 42.3   Assessment/Plan:   Elevated triglycerides with high cholesterol. Maximum will continue his medication as directed.   Vitamin D deficiency. Low Vitamin D level contributes to fatigue and are associated with Charles, breast, and colon cancer. He agrees to begin OTC Vitamin D 2,000 IU daily and will follow-up for routine testing of Vitamin D, at least 2-3 times per year to avoid over-replacement.  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  Jarmar is currently in the action stage of change. As such, his goal is to continue with weight loss efforts. He has agreed to the Category 2 Plan.   He will work on meal planning and mindful eating.   Exercise goals: All adults should avoid inactivity. Some physical activity is better than none, and adults who participate in any amount of physical activity gain some health benefits.  Behavioral modification strategies: increasing  lean protein intake, decreasing simple carbohydrates, increasing vegetables, increasing water intake, decreasing eating out, no skipping meals, meal planning and cooking strategies, keeping healthy foods in the home, celebration eating strategies and planning for success.  Charles Diaz has agreed to follow-up with our clinic in 3 weeks. He was informed of the importance of frequent follow-up visits to maximize his success with intensive lifestyle modifications for his multiple health conditions.   Objective:   Blood pressure 121/88, pulse 73, temperature 98.1 F (36.7 C), height 5\' 8"  (1.727 m), weight 245 lb (111.1 kg), SpO2 (!) 80 %. Body mass index is 37.25 kg/m.  General: Cooperative, alert, well developed, in no acute distress. HEENT: Conjunctivae and lids unremarkable. Cardiovascular: Regular rhythm.  Lungs: Normal work of breathing. Neurologic: No focal deficits.   Lab Results  Component Value Date   CREATININE 1.05 03/16/2020   BUN 13 03/16/2020   NA 145 (H) 03/16/2020   K 3.9 03/16/2020   CL 106 03/16/2020   CO2 24 03/16/2020   Lab Results  Component Value Date   ALT 46 (H) 03/16/2020   AST 24 03/16/2020   ALKPHOS 119 03/16/2020   BILITOT 0.4 03/16/2020   Lab Results  Component Value Date   HGBA1C 5.4 01/27/2020   HGBA1C 5.3 02/23/2018   Lab Results  Component Value Date   INSULIN 15.4 03/16/2020   INSULIN 14.4 01/27/2020   Lab Results  Component Value Date   TSH 1.800 01/27/2020   Lab Results  Component Value Date   CHOL 141 03/16/2020   HDL 32 (L) 03/16/2020  LDLCALC 78 03/16/2020   LDLDIRECT 56.0 03/27/2019   TRIG 182 (H) 03/16/2020   CHOLHDL 6 03/27/2019   Lab Results  Component Value Date   WBC 6.1 01/04/2019   HGB 14.1 01/04/2019   HCT 41.5 01/04/2019   MCV 85.4 01/04/2019   PLT 159.0 01/04/2019   Lab Results  Component Value Date   FERRITIN 450 (H) 12/16/2018   Attestation Statements:   Reviewed by clinician on day of visit: allergies,  medications, problem list, medical history, surgical history, family history, social history, and previous encounter notes.  Time spent on visit including pre-visit chart review and post-visit charting and care was 20 minutes.   Fernanda Drum, am acting as Energy manager for [employer], DO   I have reviewed the above documentation for accuracy and completeness, and I agree with the above. Corinna Capra, DO

## 2020-06-24 ENCOUNTER — Ambulatory Visit (INDEPENDENT_AMBULATORY_CARE_PROVIDER_SITE_OTHER): Payer: BC Managed Care – PPO | Admitting: Bariatrics

## 2020-06-24 ENCOUNTER — Other Ambulatory Visit: Payer: Self-pay

## 2020-06-24 ENCOUNTER — Encounter (INDEPENDENT_AMBULATORY_CARE_PROVIDER_SITE_OTHER): Payer: Self-pay | Admitting: Bariatrics

## 2020-06-24 VITALS — BP 129/84 | HR 71 | Temp 97.6°F | Ht 68.0 in | Wt 250.0 lb

## 2020-06-24 DIAGNOSIS — E782 Mixed hyperlipidemia: Secondary | ICD-10-CM

## 2020-06-24 DIAGNOSIS — Z6838 Body mass index (BMI) 38.0-38.9, adult: Secondary | ICD-10-CM | POA: Diagnosis not present

## 2020-06-25 NOTE — Progress Notes (Signed)
Chief Complaint:   OBESITY Charles Diaz is here to discuss his progress with his obesity treatment plan along with follow-up of his obesity related diagnoses. Charles Diaz is on the Category 2 Plan and states he is following his eating plan approximately 25% of the time. Charles Diaz states he is playing soccer for 60 minutes 1 times per week.  Today's visit was #: 7 Starting weight: 260 lbs Starting date: 01/27/2020 Today's weight: 250 lbs Today's date: 06/24/2020 Total lbs lost to date: 10 lbs Total lbs lost since last in-office visit: 0  Interim History: Charles Diaz is up 5 pounds from his last visit, but has done well overall.  Subjective:   1. Elevated triglycerides with high cholesterol Charles Diaz is taking fenofibrate 160 mg daily.  Lab Results  Component Value Date   ALT 46 (H) 03/16/2020   AST 24 03/16/2020   ALKPHOS 119 03/16/2020   BILITOT 0.4 03/16/2020   Lab Results  Component Value Date   CHOL 141 03/16/2020   HDL 32 (L) 03/16/2020   LDLCALC 78 03/16/2020   LDLDIRECT 56.0 03/27/2019   TRIG 182 (H) 03/16/2020   CHOLHDL 6 03/27/2019   Assessment/Plan:   1. Elevated triglycerides with high cholesterol Continue fenofibrate as prescribed.  2. Class 2 severe obesity with serious comorbidity and body mass index (BMI) of 38.0 to 38.9 in adult, unspecified obesity type [facility])  Charles Diaz is currently in the action stage of change. As such, his goal is to continue with weight loss efforts. He has agreed to the Category 2 Plan, keeping a food journal and adhering to recommended goals of 1200 calories and 80-85 grams of protein and the Pescatarian Plan.   He will work on mindful eating, meal planning, and will be more adherent ot the plan.  Exercise goals: Increase exercise and soccer pick-up games 2 times per week.  Behavioral modification strategies: increasing lean protein intake, decreasing simple carbohydrates, increasing vegetables, increasing water intake, decreasing eating out, no skipping meals,  meal planning and cooking strategies, keeping healthy foods in the home and planning for success.  Charles Diaz has agreed to follow-up with our clinic in 2-3 weeks, fasting. He was informed of the importance of frequent follow-up visits to maximize his success with intensive lifestyle modifications for his multiple health conditions.   Objective:   Blood pressure 129/84, pulse 71, temperature 97.6 F (36.4 C), height 5\' 8"  (1.727 m), weight 250 lb (113.4 kg), SpO2 98 %. Body mass index is 38.01 kg/m.  General: Cooperative, alert, well developed, in no acute distress. HEENT: Conjunctivae and lids unremarkable. Cardiovascular: Regular rhythm.  Lungs: Normal work of breathing. Neurologic: No focal deficits.   Lab Results  Component Value Date   CREATININE 1.05 03/16/2020   BUN 13 03/16/2020   NA 145 (H) 03/16/2020   K 3.9 03/16/2020   CL 106 03/16/2020   CO2 24 03/16/2020   Lab Results  Component Value Date   ALT 46 (H) 03/16/2020   AST 24 03/16/2020   ALKPHOS 119 03/16/2020   BILITOT 0.4 03/16/2020   Lab Results  Component Value Date   HGBA1C 5.4 01/27/2020   HGBA1C 5.3 02/23/2018   Lab Results  Component Value Date   INSULIN 15.4 03/16/2020   INSULIN 14.4 01/27/2020   Lab Results  Component Value Date   TSH 1.800 01/27/2020   Lab Results  Component Value Date   CHOL 141 03/16/2020   HDL 32 (L) 03/16/2020   LDLCALC 78 03/16/2020   LDLDIRECT 56.0 03/27/2019  TRIG 182 (H) 03/16/2020   CHOLHDL 6 03/27/2019   Lab Results  Component Value Date   WBC 6.1 01/04/2019   HGB 14.1 01/04/2019   HCT 41.5 01/04/2019   MCV 85.4 01/04/2019   PLT 159.0 01/04/2019   Lab Results  Component Value Date   FERRITIN 450 (H) 12/16/2018   Attestation Statements:   Reviewed by clinician on day of visit: allergies, medications, problem list, medical history, surgical history, family history, social history, and previous encounter notes.  Time spent on visit including pre-visit  chart review and post-visit care and charting was 20 minutes.   I, Insurance claims handler, CMA, am acting as Energy manager for [employer], DO  I have reviewed the above documentation for accuracy and completeness, and I agree with the above. Corinna Capra, DO

## 2020-07-16 ENCOUNTER — Other Ambulatory Visit: Payer: Self-pay

## 2020-07-16 ENCOUNTER — Encounter (INDEPENDENT_AMBULATORY_CARE_PROVIDER_SITE_OTHER): Payer: Self-pay | Admitting: Bariatrics

## 2020-07-16 ENCOUNTER — Ambulatory Visit (INDEPENDENT_AMBULATORY_CARE_PROVIDER_SITE_OTHER): Payer: BC Managed Care – PPO | Admitting: Bariatrics

## 2020-07-16 VITALS — BP 111/77 | HR 72 | Temp 97.8°F | Ht 68.0 in | Wt 249.0 lb

## 2020-07-16 DIAGNOSIS — R7989 Other specified abnormal findings of blood chemistry: Secondary | ICD-10-CM

## 2020-07-16 DIAGNOSIS — E8881 Metabolic syndrome: Secondary | ICD-10-CM

## 2020-07-16 DIAGNOSIS — E559 Vitamin D deficiency, unspecified: Secondary | ICD-10-CM | POA: Diagnosis not present

## 2020-07-16 DIAGNOSIS — Z9189 Other specified personal risk factors, not elsewhere classified: Secondary | ICD-10-CM | POA: Diagnosis not present

## 2020-07-16 DIAGNOSIS — E782 Mixed hyperlipidemia: Secondary | ICD-10-CM

## 2020-07-16 DIAGNOSIS — Z6837 Body mass index (BMI) 37.0-37.9, adult: Secondary | ICD-10-CM

## 2020-07-16 NOTE — Progress Notes (Signed)
Chief Complaint:   OBESITY Charles Diaz is here to discuss his progress with his obesity treatment plan along with follow-up of his obesity related diagnoses. Charles Diaz is on the Category 2 Plan and states he is following his eating plan approximately 80% of the time. Charles Diaz states he is playing soccer for 60 minutes 1 time per week and walking for 10 minutes 7 times per week.  Today's visit was #: 8 Starting weight: 260 lbs Starting date: 01/27/2020 Today's weight: 249 lbs Today's date: 07/16/2020 Total lbs lost to date: 11 lbs Total lbs lost since last in-office visit: 1 lb  Interim History: Charles Diaz is down 1 pound and has done well overall.  He has stressed at work.  Subjective:   1. Elevated cholesterol with high triglycerides Charles Diaz has hyperlipidemia and has been trying to improve his cholesterol levels with intensive lifestyle modification including a low saturated fat diet, exercise and weight loss. He denies any chest pain, claudication or myalgias.  He is taking fenofibrate.   Lab Results  Component Value Date   ALT 46 (H) 03/16/2020   AST 24 03/16/2020   ALKPHOS 119 03/16/2020   BILITOT 0.4 03/16/2020   Lab Results  Component Value Date   CHOL 141 03/16/2020   HDL 32 (L) 03/16/2020   LDLCALC 78 03/16/2020   LDLDIRECT 56.0 03/27/2019   TRIG 182 (H) 03/16/2020   CHOLHDL 6 03/27/2019   2. Vitamin D deficiency Charles Diaz's Vitamin D level was 42.3 on 03/16/2020. He is currently taking OTC vitamin D. He denies nausea, vomiting or muscle weakness.    3. Elevated LFTs Charles Diaz has a new dx of elevated ALT. His BMI is over 40. He denies abdominal pain or jaundice and has never been told of any liver problems in the past. He denies excessive alcohol intake.  No abdominal pain.  Improving ALT and AST.  Lab Results  Component Value Date   ALT 46 (H) 03/16/2020   AST 24 03/16/2020   ALKPHOS 119 03/16/2020   BILITOT 0.4 03/16/2020   4. Insulin resistance Charles Diaz has a diagnosis of insulin  resistance based on his elevated fasting insulin level >5. He continues to work on diet and exercise to decrease his risk of diabetes.  Denies polyphagia.  Lab Results  Component Value Date   INSULIN 15.4 03/16/2020   INSULIN 14.4 01/27/2020   Lab Results  Component Value Date   HGBA1C 5.4 01/27/2020   5. At risk for heart disease Charles Diaz is at a higher than average risk for cardiovascular disease due to obesity, elevated cholesterol, and elevated triglycerides.   Assessment/Plan:   1. Elevated cholesterol with high triglycerides Cardiovascular risk and specific lipid/LDL goals reviewed.  We discussed several lifestyle modifications today and Ira will continue to work on diet, exercise and weight loss efforts. Orders and follow up as documented in patient record.  Check lipid panel today and continue medications.  Counseling Intensive lifestyle modifications are the first line treatment for this issue. . Dietary changes: Increase soluble fiber. Decrease simple carbohydrates. . Exercise changes: Moderate to vigorous-intensity aerobic activity 150 minutes per week if tolerated. . Lipid-lowering medications: see documented in medical record.  - Lipid Panel With LDL/HDL Ratio  2. Vitamin D deficiency Low Vitamin D level contributes to fatigue and are associated with obesity, breast, and colon cancer. Will check vitamin D level today.  - VITAMIN D 25 Hydroxy (Vit-D Deficiency, Fractures)  3. Elevated LFTs We discussed the likely diagnosis of non-alcoholic fatty liver  disease today and how this condition is obesity related. Charles Diaz was educated the importance of weight loss. Charles Diaz agreed to continue with his weight loss efforts with healthier diet and exercise as an essential part of his treatment plan.  Will check CMP today.  - Comprehensive metabolic panel  4. Insulin resistance Charles Diaz will continue to work on weight loss, exercise, and decreasing simple carbohydrates to help decrease the  risk of diabetes. Charles Diaz agreed to follow-up with [place] as directed to closely monitor his progress.  Will check insulin level today.  - Insulin, random  5. At risk for heart disease Charles Diaz was given approximately 15 minutes of coronary artery disease prevention counseling today. He is 40 y.o. male and has risk factors for heart disease including obesity. We discussed intensive lifestyle modifications today with an emphasis on specific weight loss instructions and strategies.   Repetitive spaced learning was employed today to elicit superior memory formation and behavioral change.  6. Class 2 severe obesity with serious comorbidity and body mass index (BMI) of 37.0 to 37.9 in adult, unspecified obesity type Charles Diaz)  Charles Diaz is currently in the action stage of change. As such, his goal is to continue with weight loss efforts. He has agreed to the Category 2 Plan.   He will work on meal planning, intentional eating, and will continue to pack his lunch.  Exercise goals: Still walking and will do exercise bike.  Behavioral modification strategies: increasing lean protein intake, decreasing simple carbohydrates, increasing vegetables, increasing water intake, decreasing eating out, no skipping meals, meal planning and cooking strategies, keeping healthy foods in the home and planning for success.  Charles Diaz has agreed to follow-up with our clinic in 2-3 weeks. He was informed of the importance of frequent follow-up visits to maximize his success with intensive lifestyle modifications for his multiple health conditions.   Objective:   Blood pressure 111/77, pulse 72, temperature 97.8 F (36.6 C), height 5\' 8"  (1.727 m), weight 249 lb (112.9 kg), SpO2 98 %. Body mass index is 37.86 kg/m.  General: Cooperative, alert, well developed, in no acute distress. HEENT: Conjunctivae and lids unremarkable. Cardiovascular: Regular rhythm.  Lungs: Normal work of breathing. Neurologic: No focal deficits.   Lab Results   Component Value Date   CREATININE 1.05 03/16/2020   BUN 13 03/16/2020   NA 145 (H) 03/16/2020   K 3.9 03/16/2020   CL 106 03/16/2020   CO2 24 03/16/2020   Lab Results  Component Value Date   ALT 46 (H) 03/16/2020   AST 24 03/16/2020   ALKPHOS 119 03/16/2020   BILITOT 0.4 03/16/2020   Lab Results  Component Value Date   HGBA1C 5.4 01/27/2020   HGBA1C 5.3 02/23/2018   Lab Results  Component Value Date   INSULIN 15.4 03/16/2020   INSULIN 14.4 01/27/2020   Lab Results  Component Value Date   TSH 1.800 01/27/2020   Lab Results  Component Value Date   CHOL 141 03/16/2020   HDL 32 (L) 03/16/2020   LDLCALC 78 03/16/2020   LDLDIRECT 56.0 03/27/2019   TRIG 182 (H) 03/16/2020   CHOLHDL 6 03/27/2019   Lab Results  Component Value Date   WBC 6.1 01/04/2019   HGB 14.1 01/04/2019   HCT 41.5 01/04/2019   MCV 85.4 01/04/2019   PLT 159.0 01/04/2019   Lab Results  Component Value Date   FERRITIN 450 (H) 12/16/2018   Attestation Statements:   Reviewed by clinician on day of visit: allergies, medications, problem list, medical  history, surgical history, family history, social history, and previous encounter notes.  I, Insurance claims handler, CMA, am acting as Energy manager for [employer], DO  I have reviewed the above documentation for accuracy and completeness, and I agree with the above. Corinna Capra, DO

## 2020-07-17 LAB — COMPREHENSIVE METABOLIC PANEL
ALT: 57 IU/L — ABNORMAL HIGH (ref 0–44)
AST: 39 IU/L (ref 0–40)
Albumin/Globulin Ratio: 1.9 (ref 1.2–2.2)
Albumin: 4.3 g/dL (ref 4.0–5.0)
Alkaline Phosphatase: 99 IU/L (ref 44–121)
BUN/Creatinine Ratio: 11 (ref 9–20)
BUN: 13 mg/dL (ref 6–20)
Bilirubin Total: 0.5 mg/dL (ref 0.0–1.2)
CO2: 24 mmol/L (ref 20–29)
Calcium: 9.4 mg/dL (ref 8.7–10.2)
Chloride: 104 mmol/L (ref 96–106)
Creatinine, Ser: 1.15 mg/dL (ref 0.76–1.27)
GFR calc Af Amer: 93 mL/min/{1.73_m2} (ref >59)
GFR calc non Af Amer: 80 mL/min/{1.73_m2} (ref >59)
Globulin, Total: 2.3 g/dL (ref 1.5–4.5)
Glucose: 102 mg/dL — ABNORMAL HIGH (ref 65–99)
Potassium: 4.1 mmol/L (ref 3.5–5.2)
Sodium: 142 mmol/L (ref 134–144)
Total Protein: 6.6 g/dL (ref 6.0–8.5)

## 2020-07-17 LAB — INSULIN, RANDOM: INSULIN: 15.5 u[IU]/mL (ref 2.6–24.9)

## 2020-07-17 LAB — LIPID PANEL WITH LDL/HDL RATIO
Cholesterol, Total: 151 mg/dL (ref 100–199)
HDL: 36 mg/dL — ABNORMAL LOW (ref >39)
LDL Chol Calc (NIH): 85 mg/dL (ref 0–99)
LDL/HDL Ratio: 2.4 ratio (ref 0.0–3.6)
Triglycerides: 172 mg/dL — ABNORMAL HIGH (ref 0–149)
VLDL Cholesterol Cal: 30 mg/dL (ref 5–40)

## 2020-07-17 LAB — VITAMIN D 25 HYDROXY (VIT D DEFICIENCY, FRACTURES): Vit D, 25-Hydroxy: 22.4 ng/mL — ABNORMAL LOW (ref 30.0–100.0)

## 2020-07-20 ENCOUNTER — Encounter (INDEPENDENT_AMBULATORY_CARE_PROVIDER_SITE_OTHER): Payer: Self-pay | Admitting: Bariatrics

## 2020-08-06 ENCOUNTER — Other Ambulatory Visit: Payer: Self-pay

## 2020-08-06 ENCOUNTER — Encounter (INDEPENDENT_AMBULATORY_CARE_PROVIDER_SITE_OTHER): Payer: Self-pay | Admitting: Bariatrics

## 2020-08-06 ENCOUNTER — Ambulatory Visit (INDEPENDENT_AMBULATORY_CARE_PROVIDER_SITE_OTHER): Payer: BC Managed Care – PPO | Admitting: Bariatrics

## 2020-08-06 VITALS — BP 120/84 | HR 76 | Temp 98.2°F | Ht 68.0 in | Wt 248.0 lb

## 2020-08-06 DIAGNOSIS — E559 Vitamin D deficiency, unspecified: Secondary | ICD-10-CM | POA: Diagnosis not present

## 2020-08-06 DIAGNOSIS — Z9189 Other specified personal risk factors, not elsewhere classified: Secondary | ICD-10-CM | POA: Diagnosis not present

## 2020-08-06 DIAGNOSIS — Z6837 Body mass index (BMI) 37.0-37.9, adult: Secondary | ICD-10-CM

## 2020-08-06 DIAGNOSIS — R7989 Other specified abnormal findings of blood chemistry: Secondary | ICD-10-CM

## 2020-08-06 DIAGNOSIS — E782 Mixed hyperlipidemia: Secondary | ICD-10-CM

## 2020-08-06 DIAGNOSIS — E66812 Obesity, class 2: Secondary | ICD-10-CM

## 2020-08-11 ENCOUNTER — Encounter (INDEPENDENT_AMBULATORY_CARE_PROVIDER_SITE_OTHER): Payer: Self-pay | Admitting: Bariatrics

## 2020-08-11 NOTE — Progress Notes (Signed)
Chief Complaint:   OBESITY Charles Diaz is here to discuss his progress with his obesity treatment plan along with follow-up of his obesity related diagnoses. Charles Diaz is on the Category 2 Plan and states he is following his eating plan approximately 80% of the time. Charles Diaz states he is playing soccer for 90 minutes 2 times per week.  Today's visit was #: 9 Starting weight: 260 lbs Starting date: 01/27/2020 Today's weight: 248 lbs Today's date: 08/06/2020 Total lbs lost to date: 12 Total lbs lost since last in-office visit: 1  Interim History: Charles Diaz is down 1 lbs since his last visit.  Subjective:   1. Elevated LFTs Charles Diaz was previously at 108 and 46, and ALT 57.  2. Elevated cholesterol with high triglycerides Charles Diaz is taking fenofibrate. Last triglycerides was 172, and HDL 36, up from 25.  3. Vitamin D deficiency Charles Diaz is taking OTC 5,000 IU, and elevated at home.  4. At risk for impaired function of liver Charles Diaz is at increased risk for impaired metabolic function due to elevated LFTs.  Assessment/Plan:   1. Elevated LFTs We discussed the likely diagnosis of non-alcoholic fatty liver disease today and how this condition is obesity related. Charles Diaz was educated the importance of weight loss. Charles Diaz agreed to continue with his weight loss efforts with healthier diet and exercise as an essential part of his treatment plan.  2. Elevated cholesterol with high triglycerides We discussed several lifestyle modifications today. Charles Diaz will continue fenofibrate, and will continue to work on diet, exercise and weight loss efforts. Orders and follow up as documented in patient record.   Counseling Intensive lifestyle modifications are the first line treatment for this issue. . Dietary changes: Increase soluble fiber. Decrease simple carbohydrates. . Exercise changes: Moderate to vigorous-intensity aerobic activity 150 minutes per week if tolerated. . Lipid-lowering medications: see documented in medical  record.  3. Vitamin D deficiency Low Vitamin D level contributes to fatigue and are associated with obesity, breast, and colon cancer. Charles Diaz agreed to continue taking OTC Vitamin D 5,000 IU daily and will follow-up for routine testing of Vitamin D, at least 2-3 times per year to avoid over-replacement.  4. At risk for impaired function of liver Charles Diaz was given approximately 15 minutes of impaired  metabolic function prevention counseling today. We discussed intensive lifestyle modifications today with an emphasis on specific nutrition and exercise instructions and strategies.   Repetitive spaced learning was employed today to elicit superior memory formation and behavioral change.  5. Class 2 severe obesity with serious comorbidity and body mass index (BMI) of 37.0 to 37.9 in adult, unspecified obesity type Charles Diaz) Charles Diaz is currently in the action stage of change. As such, his goal is to continue with weight loss efforts. He has agreed to the Category 2 Plan.   We discussed intentional eating, and I discussed labs from 07/16/2020 independently with the patient today.  Exercise goals: Charles Diaz will exercise more and will continue on the bike.  Behavioral modification strategies: increasing lean protein intake, decreasing simple carbohydrates, increasing vegetables, increasing water intake, decreasing eating out, no skipping meals, meal planning and cooking strategies, keeping healthy foods in the home and planning for success.  Charles Diaz has agreed to follow-up with our clinic in 2 to 3 weeks. He was informed of the importance of frequent follow-up visits to maximize his success with intensive lifestyle modifications for his multiple health conditions.   Objective:   Blood pressure 120/84, pulse 76, temperature 98.2 F (36.8 C), height 5'  8" (1.727 m), weight 248 lb (112.5 kg), SpO2 100 %. Body mass index is 37.71 kg/m.  General: Cooperative, alert, well developed, in no acute distress. HEENT:  Conjunctivae and lids unremarkable. Cardiovascular: Regular rhythm.  Lungs: Normal work of breathing. Neurologic: No focal deficits.   Lab Results  Component Value Date   CREATININE 1.15 07/16/2020   BUN 13 07/16/2020   NA 142 07/16/2020   K 4.1 07/16/2020   CL 104 07/16/2020   CO2 24 07/16/2020   Lab Results  Component Value Date   ALT 57 (H) 07/16/2020   AST 39 07/16/2020   ALKPHOS 99 07/16/2020   BILITOT 0.5 07/16/2020   Lab Results  Component Value Date   HGBA1C 5.4 01/27/2020   HGBA1C 5.3 02/23/2018   Lab Results  Component Value Date   INSULIN 15.5 07/16/2020   INSULIN 15.4 03/16/2020   INSULIN 14.4 01/27/2020   Lab Results  Component Value Date   TSH 1.800 01/27/2020   Lab Results  Component Value Date   CHOL 151 07/16/2020   HDL 36 (L) 07/16/2020   LDLCALC 85 07/16/2020   LDLDIRECT 56.0 03/27/2019   TRIG 172 (H) 07/16/2020   CHOLHDL 6 03/27/2019   Lab Results  Component Value Date   WBC 6.1 01/04/2019   HGB 14.1 01/04/2019   HCT 41.5 01/04/2019   MCV 85.4 01/04/2019   PLT 159.0 01/04/2019   Lab Results  Component Value Date   FERRITIN 450 (H) 12/16/2018   Attestation Statements:   Reviewed by clinician on day of visit: allergies, medications, problem list, medical history, surgical history, family history, social history, and previous encounter notes.   Trude Mcburney, am acting as Energy manager for [employer], DO.  I have reviewed the above documentation for accuracy and completeness, and I agree with the above. Corinna Capra, DO

## 2020-08-31 ENCOUNTER — Other Ambulatory Visit: Payer: Self-pay

## 2020-08-31 ENCOUNTER — Ambulatory Visit (INDEPENDENT_AMBULATORY_CARE_PROVIDER_SITE_OTHER): Payer: BC Managed Care – PPO | Admitting: Bariatrics

## 2020-08-31 ENCOUNTER — Encounter (INDEPENDENT_AMBULATORY_CARE_PROVIDER_SITE_OTHER): Payer: Self-pay | Admitting: Bariatrics

## 2020-08-31 VITALS — BP 114/77 | HR 63 | Temp 98.3°F | Ht 68.0 in | Wt 251.0 lb

## 2020-08-31 DIAGNOSIS — E8881 Metabolic syndrome: Secondary | ICD-10-CM | POA: Diagnosis not present

## 2020-08-31 DIAGNOSIS — Z6838 Body mass index (BMI) 38.0-38.9, adult: Secondary | ICD-10-CM

## 2020-08-31 DIAGNOSIS — E782 Mixed hyperlipidemia: Secondary | ICD-10-CM

## 2020-09-01 ENCOUNTER — Encounter (INDEPENDENT_AMBULATORY_CARE_PROVIDER_SITE_OTHER): Payer: Self-pay | Admitting: Bariatrics

## 2020-09-01 NOTE — Progress Notes (Signed)
Chief Complaint:   OBESITY Charles Diaz is here to discuss his progress with his obesity treatment plan along with follow-up of his obesity related diagnoses. Charles Diaz is on the Category 1 Plan and states he is following his eating plan approximately 80% of the time. Charles Diaz states he is playing soccer for 90 minutes 1 time per week.  Today's visit was #: 10 Starting weight: 260 lbs Starting date: 01/27/2020 Today's weight: 251 lbs Today's date: 08/31/2020 Total lbs lost to date: 9 lbs Total lbs lost since last in-office visit: 0  Interim History: Charles Diaz is up 3 pounds since his last visit but has done well overall.  He is doing well with his water.  Subjective:   1. Insulin resistance Charles Diaz has a diagnosis of insulin resistance based on his elevated fasting insulin level >5. He continues to work on diet and exercise to decrease his risk of diabetes.  He is on no medications for this.  Lab Results  Component Value Date   INSULIN 15.5 07/16/2020   INSULIN 15.4 03/16/2020   INSULIN 14.4 01/27/2020   Lab Results  Component Value Date   HGBA1C 5.4 01/27/2020   2. Elevated cholesterol with high triglycerides Charles Diaz has hyperlipidemia and has been trying to improve his cholesterol levels with intensive lifestyle modification including a low saturated fat diet, exercise and weight loss. He denies any chest pain, claudication or myalgias.  He is taking fenofibrate.   Lab Results  Component Value Date   ALT 57 (H) 07/16/2020   AST 39 07/16/2020   ALKPHOS 99 07/16/2020   BILITOT 0.5 07/16/2020   Lab Results  Component Value Date   CHOL 151 07/16/2020   HDL 36 (L) 07/16/2020   LDLCALC 85 07/16/2020   LDLDIRECT 56.0 03/27/2019   TRIG 172 (H) 07/16/2020   CHOLHDL 6 03/27/2019   Assessment/Plan:   1. Insulin resistance Charles Diaz will continue to work on weight loss, exercise, and decreasing simple carbohydrates to help decrease the risk of diabetes. Charles Diaz agreed to follow-up with [place] as directed to  closely monitor his progress.  Keep carbohydrates low, increase protein and decrease fat.  Increase aerobic exercise.  2. Elevated cholesterol with high triglycerides Cardiovascular risk and specific lipid/LDL goals reviewed.  We discussed several lifestyle modifications today and Charles Diaz will continue to work on diet, exercise and weight loss efforts. Orders and follow up as documented in patient record.  Continue medication.   Counseling Intensive lifestyle modifications are the first line treatment for this issue. . Dietary changes: Increase soluble fiber. Decrease simple carbohydrates. . Exercise changes: Moderate to vigorous-intensity aerobic activity 150 minutes per week if tolerated. . Lipid-lowering medications: see documented in medical record.  3. Class 2 severe obesity with serious comorbidity and body mass index (BMI) of 38.0 to 38.9 in adult, unspecified obesity type Charles Diaz)  Charles Diaz is currently in the action stage of change. As such, his goal is to continue with weight loss efforts. He has agreed to the Category 1 Plan.   He will work on meal planning and mindful eating.  Exercise goals: Walking and developing a routine.  Behavioral modification strategies: increasing lean protein intake, decreasing simple carbohydrates, increasing vegetables, increasing water intake, decreasing eating out, no skipping meals, meal planning and cooking strategies, keeping healthy foods in the home and avoiding temptations.  Charles Diaz has agreed to follow-up with our clinic in 2-3 weeks. He was informed of the importance of frequent follow-up visits to maximize his success with intensive lifestyle modifications  for his multiple health conditions.   Objective:   Blood pressure 114/77, pulse 63, temperature 98.3 F (36.8 C), height 5\' 8"  (1.727 m), weight 251 lb (113.9 kg), SpO2 94 %. Body mass index is 38.16 kg/m.  General: Cooperative, alert, well developed, in no acute distress. HEENT: Conjunctivae  and lids unremarkable. Cardiovascular: Regular rhythm.  Lungs: Normal work of breathing. Neurologic: No focal deficits.   Lab Results  Component Value Date   CREATININE 1.15 07/16/2020   BUN 13 07/16/2020   NA 142 07/16/2020   K 4.1 07/16/2020   CL 104 07/16/2020   CO2 24 07/16/2020   Lab Results  Component Value Date   ALT 57 (H) 07/16/2020   AST 39 07/16/2020   ALKPHOS 99 07/16/2020   BILITOT 0.5 07/16/2020   Lab Results  Component Value Date   HGBA1C 5.4 01/27/2020   HGBA1C 5.3 02/23/2018   Lab Results  Component Value Date   INSULIN 15.5 07/16/2020   INSULIN 15.4 03/16/2020   INSULIN 14.4 01/27/2020   Lab Results  Component Value Date   TSH 1.800 01/27/2020   Lab Results  Component Value Date   CHOL 151 07/16/2020   HDL 36 (L) 07/16/2020   LDLCALC 85 07/16/2020   LDLDIRECT 56.0 03/27/2019   TRIG 172 (H) 07/16/2020   CHOLHDL 6 03/27/2019   Lab Results  Component Value Date   WBC 6.1 01/04/2019   HGB 14.1 01/04/2019   HCT 41.5 01/04/2019   MCV 85.4 01/04/2019   PLT 159.0 01/04/2019   Lab Results  Component Value Date   FERRITIN 450 (H) 12/16/2018   Attestation Statements:   Reviewed by clinician on day of visit: allergies, medications, problem list, medical history, surgical history, family history, social history, and previous encounter notes.  Time spent on visit including pre-visit chart review and post-visit care and charting was 20 minutes.   I, 12/18/2018, CMA, am acting as Insurance claims handler for Energy manager, DO  I have reviewed the above documentation for accuracy and completeness, and I agree with the above. [employer], DO

## 2020-10-01 ENCOUNTER — Ambulatory Visit (INDEPENDENT_AMBULATORY_CARE_PROVIDER_SITE_OTHER): Payer: BC Managed Care – PPO | Admitting: Bariatrics

## 2020-11-23 IMAGING — DX DG KNEE AP/LAT W/ SUNRISE*L*
3 series · 3 of 3 positions shown · non-contrast
Comparison: None.

CLINICAL DATA: Bilateral knee pain. No trauma history submitted.

EXAM:
LEFT KNEE 3 VIEWS

[knee ap]
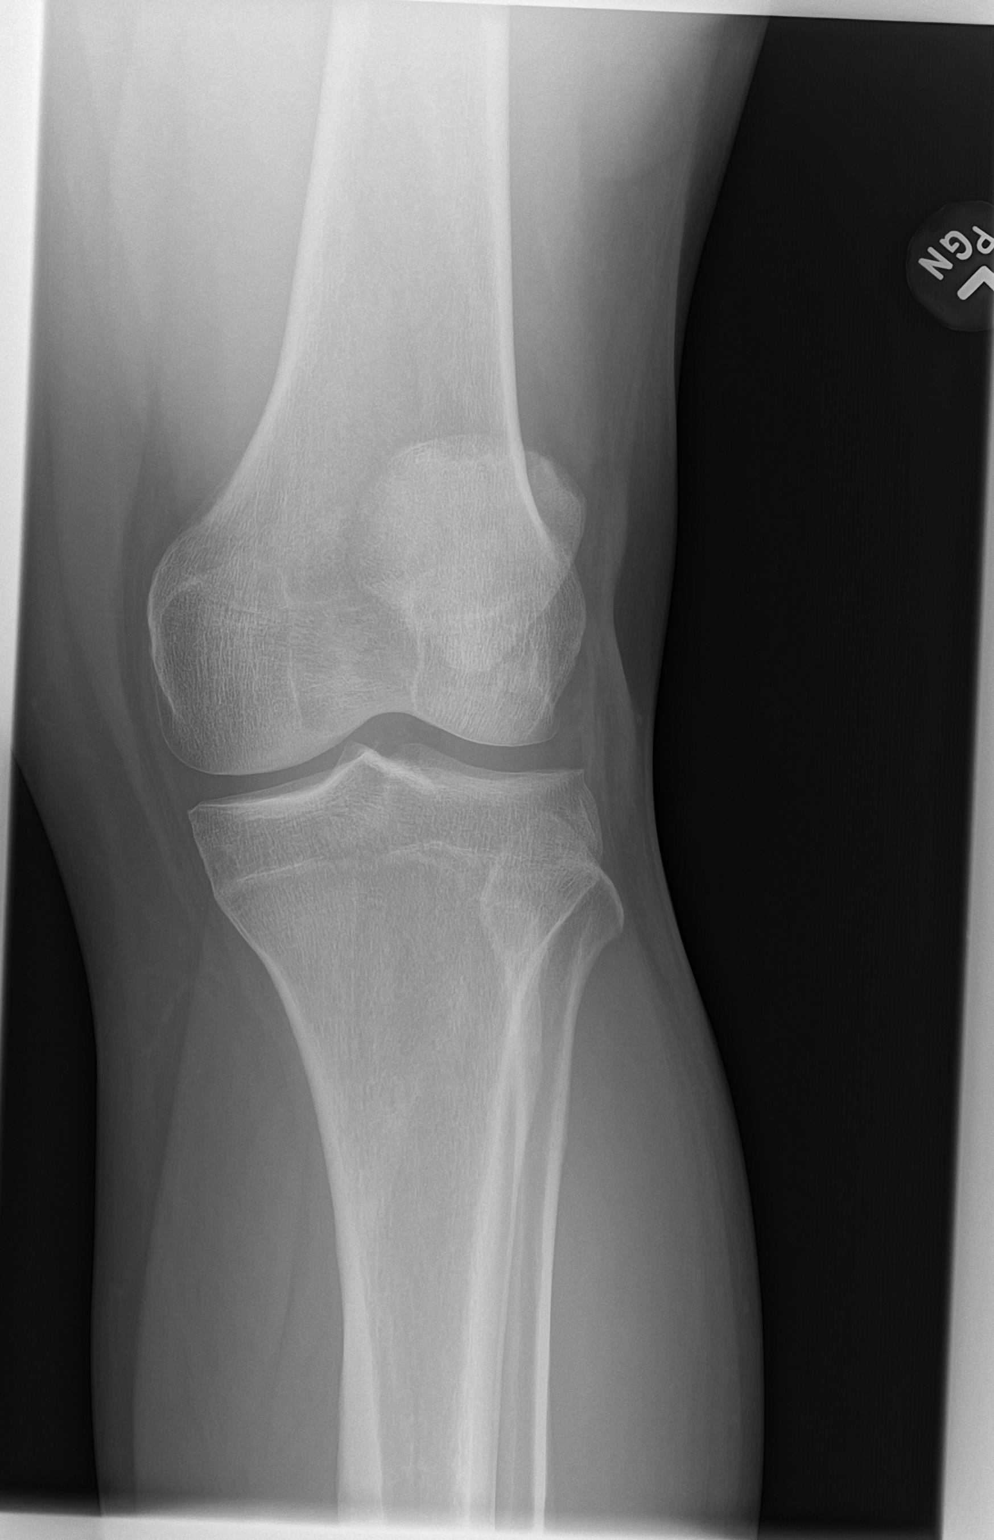

[knee lat]
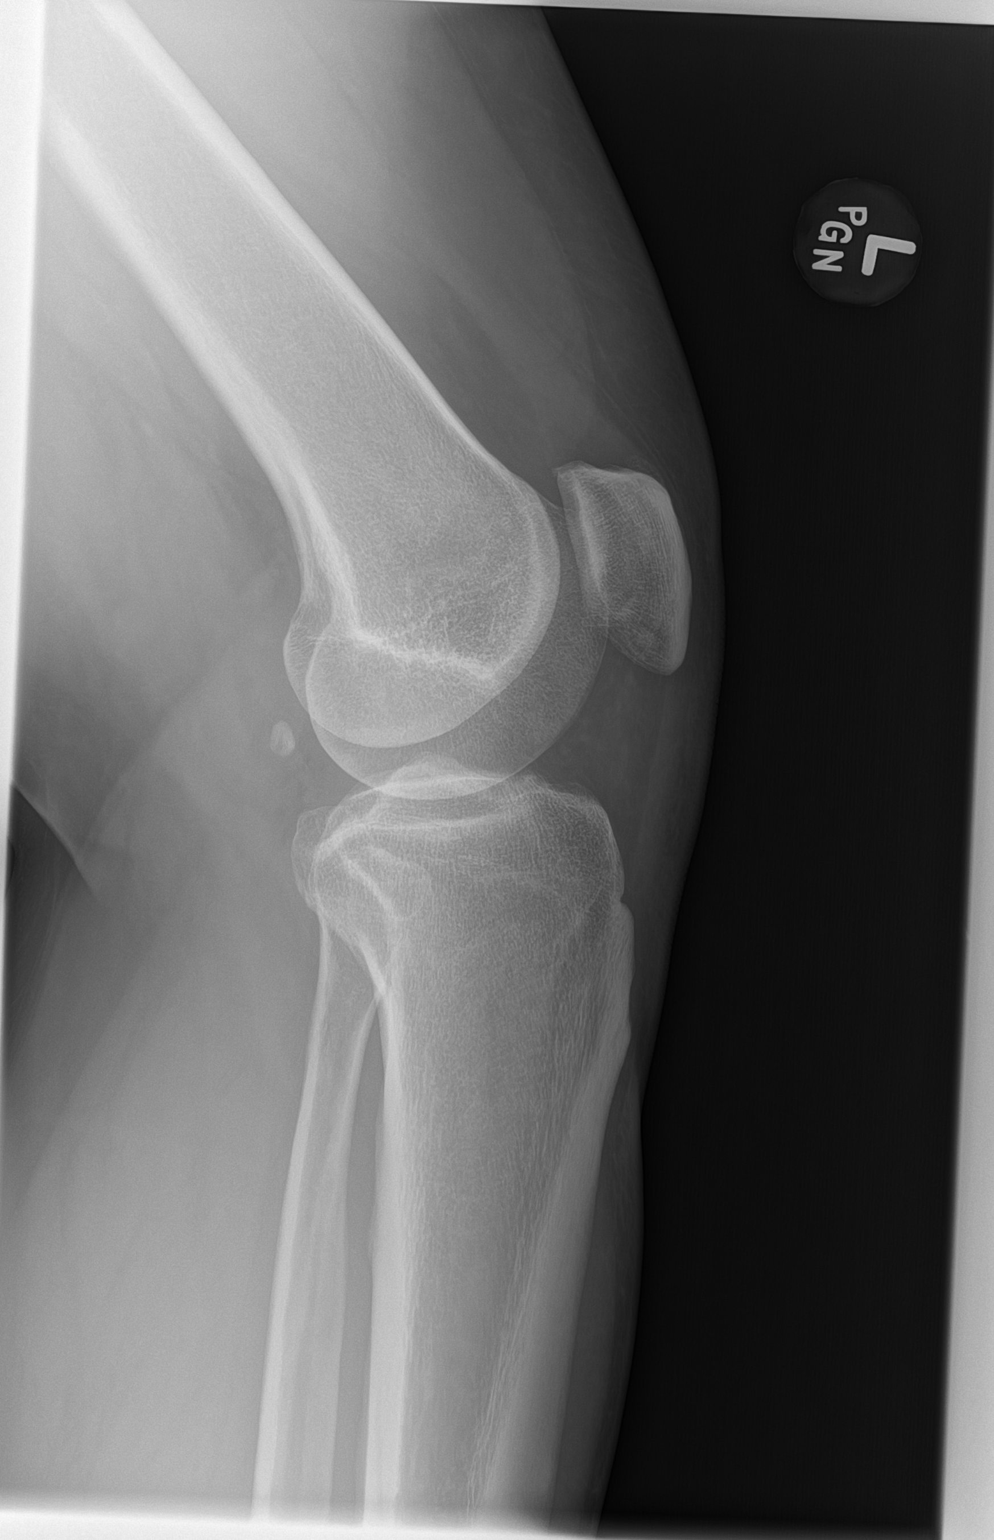

[patella]
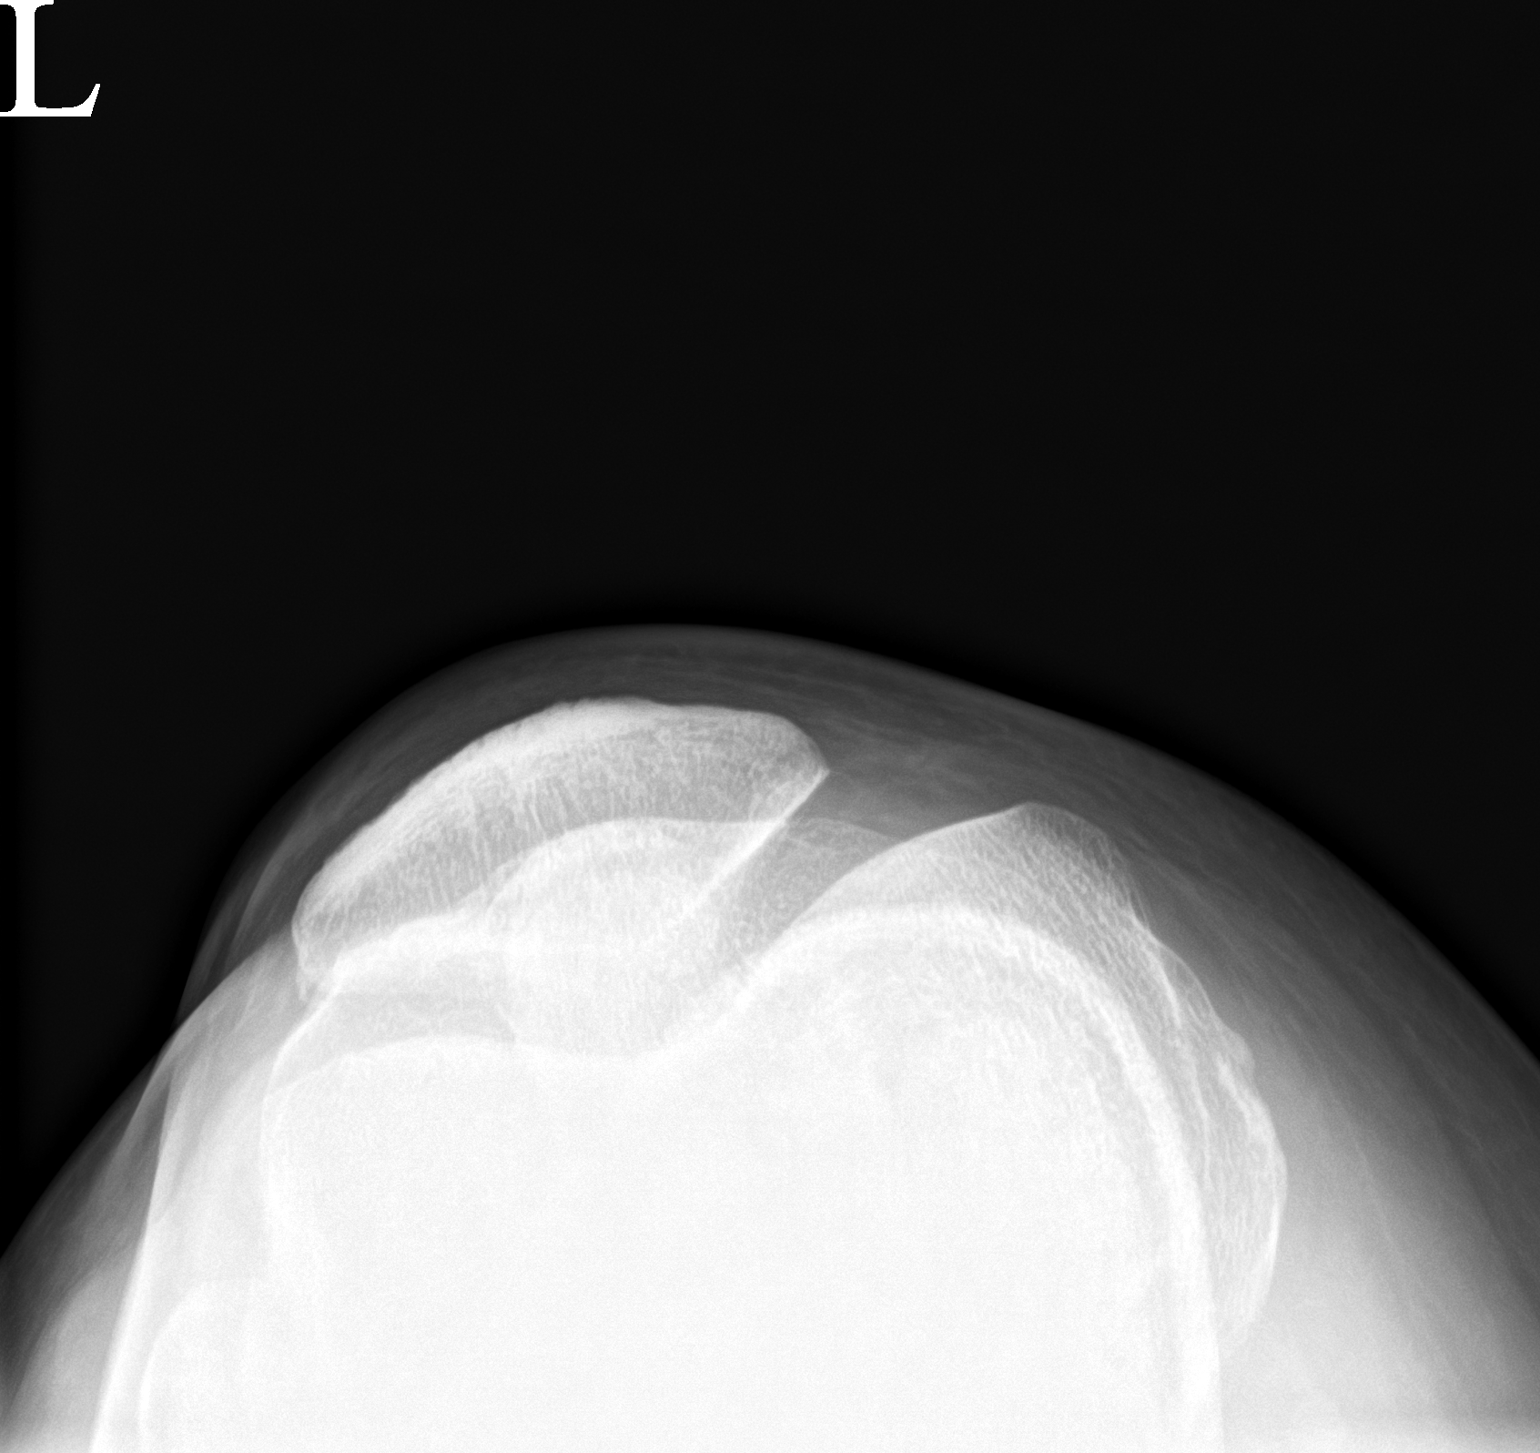

[3 of 3 positions shown; findings below may reference images not displayed]

FINDINGS: No acute fracture or dislocation. Joint spaces maintained. No joint
effusion.
IMPRESSION: No acute osseous abnormality.

## 2020-11-24 ENCOUNTER — Other Ambulatory Visit: Payer: Self-pay | Admitting: Internal Medicine

## 2021-02-12 DIAGNOSIS — M9901 Segmental and somatic dysfunction of cervical region: Secondary | ICD-10-CM | POA: Diagnosis not present

## 2021-02-12 DIAGNOSIS — M9904 Segmental and somatic dysfunction of sacral region: Secondary | ICD-10-CM | POA: Diagnosis not present

## 2021-02-12 DIAGNOSIS — M9905 Segmental and somatic dysfunction of pelvic region: Secondary | ICD-10-CM | POA: Diagnosis not present

## 2021-02-12 DIAGNOSIS — M9903 Segmental and somatic dysfunction of lumbar region: Secondary | ICD-10-CM | POA: Diagnosis not present

## 2021-02-15 DIAGNOSIS — M9905 Segmental and somatic dysfunction of pelvic region: Secondary | ICD-10-CM | POA: Diagnosis not present

## 2021-02-15 DIAGNOSIS — M9901 Segmental and somatic dysfunction of cervical region: Secondary | ICD-10-CM | POA: Diagnosis not present

## 2021-02-15 DIAGNOSIS — M9904 Segmental and somatic dysfunction of sacral region: Secondary | ICD-10-CM | POA: Diagnosis not present

## 2021-02-15 DIAGNOSIS — M9903 Segmental and somatic dysfunction of lumbar region: Secondary | ICD-10-CM | POA: Diagnosis not present

## 2021-02-23 DIAGNOSIS — M9904 Segmental and somatic dysfunction of sacral region: Secondary | ICD-10-CM | POA: Diagnosis not present

## 2021-02-23 DIAGNOSIS — M9905 Segmental and somatic dysfunction of pelvic region: Secondary | ICD-10-CM | POA: Diagnosis not present

## 2021-02-23 DIAGNOSIS — M9901 Segmental and somatic dysfunction of cervical region: Secondary | ICD-10-CM | POA: Diagnosis not present

## 2021-02-23 DIAGNOSIS — M9903 Segmental and somatic dysfunction of lumbar region: Secondary | ICD-10-CM | POA: Diagnosis not present

## 2021-02-24 DIAGNOSIS — M9901 Segmental and somatic dysfunction of cervical region: Secondary | ICD-10-CM | POA: Diagnosis not present

## 2021-02-24 DIAGNOSIS — M9905 Segmental and somatic dysfunction of pelvic region: Secondary | ICD-10-CM | POA: Diagnosis not present

## 2021-02-24 DIAGNOSIS — M9903 Segmental and somatic dysfunction of lumbar region: Secondary | ICD-10-CM | POA: Diagnosis not present

## 2021-02-24 DIAGNOSIS — M9904 Segmental and somatic dysfunction of sacral region: Secondary | ICD-10-CM | POA: Diagnosis not present

## 2021-02-26 DIAGNOSIS — M9904 Segmental and somatic dysfunction of sacral region: Secondary | ICD-10-CM | POA: Diagnosis not present

## 2021-02-26 DIAGNOSIS — M9903 Segmental and somatic dysfunction of lumbar region: Secondary | ICD-10-CM | POA: Diagnosis not present

## 2021-02-26 DIAGNOSIS — M9905 Segmental and somatic dysfunction of pelvic region: Secondary | ICD-10-CM | POA: Diagnosis not present

## 2021-02-26 DIAGNOSIS — M9901 Segmental and somatic dysfunction of cervical region: Secondary | ICD-10-CM | POA: Diagnosis not present

## 2021-03-01 DIAGNOSIS — M9901 Segmental and somatic dysfunction of cervical region: Secondary | ICD-10-CM | POA: Diagnosis not present

## 2021-03-01 DIAGNOSIS — M9904 Segmental and somatic dysfunction of sacral region: Secondary | ICD-10-CM | POA: Diagnosis not present

## 2021-03-01 DIAGNOSIS — M9903 Segmental and somatic dysfunction of lumbar region: Secondary | ICD-10-CM | POA: Diagnosis not present

## 2021-03-01 DIAGNOSIS — M9905 Segmental and somatic dysfunction of pelvic region: Secondary | ICD-10-CM | POA: Diagnosis not present

## 2021-03-03 DIAGNOSIS — M9903 Segmental and somatic dysfunction of lumbar region: Secondary | ICD-10-CM | POA: Diagnosis not present

## 2021-03-03 DIAGNOSIS — M9901 Segmental and somatic dysfunction of cervical region: Secondary | ICD-10-CM | POA: Diagnosis not present

## 2021-03-03 DIAGNOSIS — M9904 Segmental and somatic dysfunction of sacral region: Secondary | ICD-10-CM | POA: Diagnosis not present

## 2021-03-03 DIAGNOSIS — M9905 Segmental and somatic dysfunction of pelvic region: Secondary | ICD-10-CM | POA: Diagnosis not present

## 2021-03-04 DIAGNOSIS — M9905 Segmental and somatic dysfunction of pelvic region: Secondary | ICD-10-CM | POA: Diagnosis not present

## 2021-03-04 DIAGNOSIS — M9901 Segmental and somatic dysfunction of cervical region: Secondary | ICD-10-CM | POA: Diagnosis not present

## 2021-03-04 DIAGNOSIS — M9903 Segmental and somatic dysfunction of lumbar region: Secondary | ICD-10-CM | POA: Diagnosis not present

## 2021-03-04 DIAGNOSIS — M9904 Segmental and somatic dysfunction of sacral region: Secondary | ICD-10-CM | POA: Diagnosis not present

## 2021-03-08 DIAGNOSIS — M9901 Segmental and somatic dysfunction of cervical region: Secondary | ICD-10-CM | POA: Diagnosis not present

## 2021-03-08 DIAGNOSIS — M9905 Segmental and somatic dysfunction of pelvic region: Secondary | ICD-10-CM | POA: Diagnosis not present

## 2021-03-08 DIAGNOSIS — M9904 Segmental and somatic dysfunction of sacral region: Secondary | ICD-10-CM | POA: Diagnosis not present

## 2021-03-08 DIAGNOSIS — M9903 Segmental and somatic dysfunction of lumbar region: Secondary | ICD-10-CM | POA: Diagnosis not present

## 2021-03-10 DIAGNOSIS — M9904 Segmental and somatic dysfunction of sacral region: Secondary | ICD-10-CM | POA: Diagnosis not present

## 2021-03-10 DIAGNOSIS — M9901 Segmental and somatic dysfunction of cervical region: Secondary | ICD-10-CM | POA: Diagnosis not present

## 2021-03-10 DIAGNOSIS — M9905 Segmental and somatic dysfunction of pelvic region: Secondary | ICD-10-CM | POA: Diagnosis not present

## 2021-03-10 DIAGNOSIS — M9903 Segmental and somatic dysfunction of lumbar region: Secondary | ICD-10-CM | POA: Diagnosis not present

## 2021-03-12 DIAGNOSIS — M9904 Segmental and somatic dysfunction of sacral region: Secondary | ICD-10-CM | POA: Diagnosis not present

## 2021-03-12 DIAGNOSIS — M9905 Segmental and somatic dysfunction of pelvic region: Secondary | ICD-10-CM | POA: Diagnosis not present

## 2021-03-12 DIAGNOSIS — M9901 Segmental and somatic dysfunction of cervical region: Secondary | ICD-10-CM | POA: Diagnosis not present

## 2021-03-12 DIAGNOSIS — M9903 Segmental and somatic dysfunction of lumbar region: Secondary | ICD-10-CM | POA: Diagnosis not present

## 2021-03-15 DIAGNOSIS — M9905 Segmental and somatic dysfunction of pelvic region: Secondary | ICD-10-CM | POA: Diagnosis not present

## 2021-03-15 DIAGNOSIS — M9901 Segmental and somatic dysfunction of cervical region: Secondary | ICD-10-CM | POA: Diagnosis not present

## 2021-03-15 DIAGNOSIS — M9904 Segmental and somatic dysfunction of sacral region: Secondary | ICD-10-CM | POA: Diagnosis not present

## 2021-03-15 DIAGNOSIS — M9903 Segmental and somatic dysfunction of lumbar region: Secondary | ICD-10-CM | POA: Diagnosis not present

## 2021-04-09 ENCOUNTER — Encounter: Payer: BC Managed Care – PPO | Admitting: Internal Medicine

## 2021-05-26 ENCOUNTER — Ambulatory Visit (INDEPENDENT_AMBULATORY_CARE_PROVIDER_SITE_OTHER): Payer: BC Managed Care – PPO | Admitting: Internal Medicine

## 2021-05-26 ENCOUNTER — Encounter: Payer: Self-pay | Admitting: Internal Medicine

## 2021-05-26 VITALS — BP 126/84 | HR 64 | Temp 98.0°F | Resp 16 | Ht 68.0 in | Wt 264.0 lb

## 2021-05-26 DIAGNOSIS — Z0000 Encounter for general adult medical examination without abnormal findings: Secondary | ICD-10-CM

## 2021-05-26 DIAGNOSIS — E559 Vitamin D deficiency, unspecified: Secondary | ICD-10-CM | POA: Diagnosis not present

## 2021-05-26 DIAGNOSIS — E785 Hyperlipidemia, unspecified: Secondary | ICD-10-CM

## 2021-05-26 DIAGNOSIS — Z23 Encounter for immunization: Secondary | ICD-10-CM | POA: Diagnosis not present

## 2021-05-26 LAB — CBC WITH DIFFERENTIAL/PLATELET
Basophils Absolute: 0 10*3/uL (ref 0.0–0.1)
Basophils Relative: 0.6 % (ref 0.0–3.0)
Eosinophils Absolute: 0.2 10*3/uL (ref 0.0–0.7)
Eosinophils Relative: 3 % (ref 0.0–5.0)
HCT: 44.1 % (ref 39.0–52.0)
Hemoglobin: 15.1 g/dL (ref 13.0–17.0)
Lymphocytes Relative: 31.3 % (ref 12.0–46.0)
Lymphs Abs: 2.3 10*3/uL (ref 0.7–4.0)
MCHC: 34.3 g/dL (ref 30.0–36.0)
MCV: 85.8 fl (ref 78.0–100.0)
Monocytes Absolute: 0.6 10*3/uL (ref 0.1–1.0)
Monocytes Relative: 8.4 % (ref 3.0–12.0)
Neutro Abs: 4.2 10*3/uL (ref 1.4–7.7)
Neutrophils Relative %: 56.7 % (ref 43.0–77.0)
Platelets: 227 10*3/uL (ref 150.0–400.0)
RBC: 5.14 Mil/uL (ref 4.22–5.81)
RDW: 13 % (ref 11.5–15.5)
WBC: 7.4 10*3/uL (ref 4.0–10.5)

## 2021-05-26 LAB — COMPREHENSIVE METABOLIC PANEL
ALT: 101 U/L — ABNORMAL HIGH (ref 0–53)
AST: 48 U/L — ABNORMAL HIGH (ref 0–37)
Albumin: 4.2 g/dL (ref 3.5–5.2)
Alkaline Phosphatase: 93 U/L (ref 39–117)
BUN: 13 mg/dL (ref 6–23)
CO2: 29 mEq/L (ref 19–32)
Calcium: 9.5 mg/dL (ref 8.4–10.5)
Chloride: 104 mEq/L (ref 96–112)
Creatinine, Ser: 1.02 mg/dL (ref 0.40–1.50)
GFR: 92.36 mL/min (ref >60.00)
Glucose, Bld: 88 mg/dL (ref 70–99)
Potassium: 4.2 mEq/L (ref 3.5–5.1)
Sodium: 138 mEq/L (ref 135–145)
Total Bilirubin: 0.9 mg/dL (ref 0.2–1.2)
Total Protein: 6.7 g/dL (ref 6.0–8.3)

## 2021-05-26 LAB — LDL CHOLESTEROL, DIRECT: Direct LDL: 52 mg/dL

## 2021-05-26 LAB — LIPID PANEL
Cholesterol: 187 mg/dL (ref 0–200)
HDL: 31.5 mg/dL — ABNORMAL LOW (ref >39.00)
Total CHOL/HDL Ratio: 6
Triglycerides: 454 mg/dL — ABNORMAL HIGH (ref 0.0–149.0)

## 2021-05-26 LAB — VITAMIN D 25 HYDROXY (VIT D DEFICIENCY, FRACTURES): VITD: 23.34 ng/mL — ABNORMAL LOW (ref 30.00–100.00)

## 2021-05-26 NOTE — Assessment & Plan Note (Signed)
-  Tdap 2014 -COVID booster recommended - flu shot:  today -CCS: Never had a cscope -Labs: CMP, FLP, CBC, with vitamin D -Diet and exercise: See comments under morbid obesity, encouraged healthy diet and go back to the wellness clinic.

## 2021-05-26 NOTE — Assessment & Plan Note (Signed)
Here for CPX Morbid obesity: Patient was seen at the wellness clinic, he was doing significant progress, then due to his busy schedule he has stopped going there; I noticed  weight gain.  Encouraged to go back to the wellness clinic, watch diet & stay active. Vitamin D deficiency: Checking labs, recommend to start OTC vitamins. Dyslipidemia, on fenofibrate, sometimes forgets to take, he is fasting, check labs RTC 1 year.

## 2021-05-26 NOTE — Patient Instructions (Signed)
Recommend to proceed with a COVID booster at your earliest convenience   GO TO THE LAB : Get the blood work     GO TO THE FRONT DESK, PLEASE SCHEDULE YOUR APPOINTMENTS Come back for a physical exam in 1 year

## 2021-05-26 NOTE — Progress Notes (Signed)
Subjective:    Patient ID: Charles Diaz, male    DOB: 1981-01-02, 40 y.o.   MRN: [id number]  DOS:  05/26/2021 Type of visit - description: CPX  Since the last office visit is doing well. Weight gain noted.  Review of Systems  Other than above, a 14 point review of systems is negative      Past Medical History:  Diagnosis Date   Dyslipidemia    High triglycerides     Past Surgical History:  Procedure Laterality Date   KNEE ARTHROSCOPY Right 2009   Social History   Socioeconomic History   Marital status: Married    Spouse name: sontehay   Number of children: 1   Years of education: Not on file   Highest education level: Not on file  Occupational History   Occupation: Advertising account planner, Technical brewer  Tobacco Use   Smoking status: Former    Types: Cigarettes    Quit date: 03/26/2018    Years since quitting: 3.1   Smokeless tobacco: Never   Tobacco comments:       Substance and Sexual Activity   Alcohol use: Yes    Comment: socially   Drug use: No   Sexual activity: Not on file  Other Topics Concern   Not on file  Social History Narrative   Married    Household: pt, wife , child (2017)   Parents from [place]   Social Determinants of Health   Financial Resource Strain: Not on file  Food Insecurity: Not on file  Transportation Needs: Not on file  Physical Activity: Not on file  Stress: Not on file  Social Connections: Not on file  Intimate Partner Violence: Not on file     Allergies as of 05/26/2021   No Known Allergies      Medication List        Accurate as of May 26, 2021  3:57 PM. If you have any questions, ask your nurse or doctor.          STOP taking these medications    Pennsaid 2 % Soln Generic drug: Diclofenac Sodium Stopped by: Willow Ora, MD       TAKE these medications    cholecalciferol 25 MCG (1000 UNIT) tablet Commonly known as: VITAMIN D3 Take 2,000 Units by mouth daily.   fenofibrate 160 MG tablet Take 1 tablet  (160 mg total) by mouth daily.           Objective:   Physical Exam BP 126/84 (BP Location: Left Arm, Patient Position: Sitting, Cuff Size: Normal)   Pulse 64   Temp 98 F (36.7 C) (Oral)   Resp 16   Ht 5\' 8"  (1.727 m)   Wt 264 lb (119.7 kg)   SpO2 98%   BMI 40.14 kg/m  General: Well developed, NAD, BMI noted Neck: No  thyromegaly  HEENT:  Normocephalic . Face symmetric, atraumatic Lungs:  CTA B Normal respiratory effort, no intercostal retractions, no accessory muscle use. Heart: RRR,  no murmur.  Abdomen:  Not distended, soft, non-tender. No rebound or rigidity.   Lower extremities: no pretibial edema bilaterally  Skin: Exposed areas without rash. Not pale. Not jaundice Neurologic:  alert & oriented X3.  Speech normal, gait appropriate for age and unassisted Strength symmetric and appropriate for age.  Psych: Cognition and judgment appear intact.  Cooperative with normal attention span and concentration.  Behavior appropriate. No anxious or depressed appearing.     Assessment    Assessment Dyslipidemia ,  high TG L arm skin lesion, s/p Bx per derm before, no dx made, as off 8-17 decreasing in size per pt  Increase LFTs 2, hep B and C serology (-) 10/2016 Moderate obesity  PLAN:  Here for CPX Morbid obesity: Patient was seen at the wellness clinic, he was doing significant progress, then due to his busy schedule he has stopped going there; I noticed  weight gain.  Encouraged to go back to the wellness clinic, watch diet & stay active. Vitamin D deficiency: Checking labs, recommend to start OTC vitamins. Dyslipidemia, on fenofibrate, sometimes forgets to take, he is fasting, check labs RTC 1 year.  This visit occurred during the SARS-CoV-2 public health emergency.  Safety protocols were in place, including screening questions prior to the visit, additional usage of staff PPE, and extensive cleaning of exam room while observing appropriate contact time as  indicated for disinfecting solutions.

## 2021-05-31 MED ORDER — VITAMIN D (ERGOCALCIFEROL) 1.25 MG (50000 UNIT) PO CAPS: 50000.0000 [IU] | ORAL_CAPSULE | ORAL | 0 refills | Status: DC

## 2021-05-31 NOTE — Addendum Note (Signed)
Addended byConrad Carey D on: 05/31/2021 08:09 AM   Modules accepted: Orders

## 2021-08-26 ENCOUNTER — Other Ambulatory Visit: Payer: Self-pay | Admitting: Internal Medicine

## 2021-10-01 ENCOUNTER — Encounter: Payer: Self-pay | Admitting: Internal Medicine

## 2021-10-01 ENCOUNTER — Ambulatory Visit: Payer: BC Managed Care – PPO | Admitting: Internal Medicine

## 2021-10-01 VITALS — BP 116/76 | HR 55 | Temp 98.1°F | Resp 18 | Ht 68.0 in | Wt 261.0 lb

## 2021-10-01 DIAGNOSIS — R7989 Other specified abnormal findings of blood chemistry: Secondary | ICD-10-CM

## 2021-10-01 DIAGNOSIS — E6609 Other obesity due to excess calories: Secondary | ICD-10-CM

## 2021-10-01 DIAGNOSIS — Z6839 Body mass index (BMI) 39.0-39.9, adult: Secondary | ICD-10-CM

## 2021-10-01 DIAGNOSIS — E559 Vitamin D deficiency, unspecified: Secondary | ICD-10-CM | POA: Diagnosis not present

## 2021-10-01 DIAGNOSIS — R748 Abnormal levels of other serum enzymes: Secondary | ICD-10-CM | POA: Diagnosis not present

## 2021-10-01 DIAGNOSIS — Z1159 Encounter for screening for other viral diseases: Secondary | ICD-10-CM | POA: Diagnosis not present

## 2021-10-01 DIAGNOSIS — E785 Hyperlipidemia, unspecified: Secondary | ICD-10-CM | POA: Diagnosis not present

## 2021-10-01 LAB — HEPATIC FUNCTION PANEL
ALT: 36 U/L (ref 0–53)
AST: 24 U/L (ref 0–37)
Albumin: 4.3 g/dL (ref 3.5–5.2)
Alkaline Phosphatase: 87 U/L (ref 39–117)
Bilirubin, Direct: 0.1 mg/dL (ref 0.0–0.3)
Total Bilirubin: 0.6 mg/dL (ref 0.2–1.2)
Total Protein: 6.5 g/dL (ref 6.0–8.3)

## 2021-10-01 LAB — VITAMIN D 25 HYDROXY (VIT D DEFICIENCY, FRACTURES): VITD: 37.64 ng/mL (ref 30.00–100.00)

## 2021-10-01 LAB — IBC + FERRITIN
Ferritin: 72.9 ng/mL (ref 22.0–322.0)
Iron: 89 ug/dL (ref 42–165)
Saturation Ratios: 24.9 % (ref 20.0–50.0)
TIBC: 357 ug/dL (ref 250.0–450.0)
Transferrin: 255 mg/dL (ref 212.0–360.0)

## 2021-10-01 LAB — LIPID PANEL
Cholesterol: 125 mg/dL (ref 0–200)
HDL: 33.8 mg/dL — ABNORMAL LOW (ref >39.00)
LDL Cholesterol: 60 mg/dL (ref 0–99)
NonHDL: 90.85
Total CHOL/HDL Ratio: 4
Triglycerides: 154 mg/dL — ABNORMAL HIGH (ref 0.0–149.0)
VLDL: 30.8 mg/dL (ref 0.0–40.0)

## 2021-10-01 NOTE — Progress Notes (Signed)
? ?  Subjective:  ? ? Patient ID: Charles Diaz, male    DOB: March 26, 1981, 41 y.o.   MRN: [id number] ? ?DOS:  10/01/2021 ?Type of visit - description: f/u ? ?Today we took about  M.O., dyslipidemia, low vitamin D and increased LFTs. ?He is a social drinker. ?He is feeling well and has no concerns. ? ?Wt Readings from Last 3 Encounters:  ?10/01/21 261 lb (118.4 kg)  ?05/26/21 264 lb (119.7 kg)  ?08/31/20 251 lb (113.9 kg)  ? ? ? ?Review of Systems ?See above  ? ?Past Medical History:  ?Diagnosis Date  ? Dyslipidemia   ? High triglycerides   ? ? ?Past Surgical History:  ?Procedure Laterality Date  ? KNEE ARTHROSCOPY Right 2009  ? ? ?Current Outpatient Medications  ?Medication Instructions  ? cholecalciferol (VITAMIN D3) 2,000 Units, Oral, Daily  ? fenofibrate 160 mg, Oral, Daily  ? Vitamin D (Ergocalciferol) (DRISDOL) 50,000 Units, Oral, Every 7 days  ? ? ?   ?Objective:  ? Physical Exam ?BP 116/76 (BP Location: Left Arm, Patient Position: Sitting, Cuff Size: Normal)   Pulse (!) 55   Temp 98.1 ?F (36.7 ?C) (Oral)   Resp 18   Ht 5\' 8"  (1.727 m)   Wt 261 lb (118.4 kg)   SpO2 97%   BMI 39.68 kg/m?  ?General:   ?Well developed, NAD, BMI noted. ?HEENT:  ?Normocephalic . Face symmetric, atraumatic ?Lungs:  ?CTA B ?Normal respiratory effort, no intercostal retractions, no accessory muscle use. ?Heart: RRR,  no murmur.  ?Lower extremities: no pretibial edema bilaterally  ?Skin: Not pale. Not jaundice ?Neurologic:  ?alert & oriented X3.  ?Speech normal, gait appropriate for age and unassisted ?Psych--  ?Cognition and judgment appear intact.  ?Cooperative with normal attention span and concentration.  ?Behavior appropriate. ?No anxious or depressed appearing.  ? ?   ?Assessment   ? ?Assessment ?Dyslipidemia , high TG ?L arm skin lesion, s/p Bx per derm before, no dx made, as off 8-17 decreasing in size per pt  ?Increase LFTs ?2, hep B and C serology (-) 10/2016 ?Moderate obesity ? ?PLAN:  ?Dyslipidemia: On fenofibrate, last  triglyceride were  450.  Risk of pancreatitis and other problems discussed, strongly recommend healthy diet.  Recheck labs today. ?Increased LFTs:  ?He is a social drinker, not taking Tylenol, suspect  fatty liver (when he lost weight,  LFTs were almost normal). ?This was explained to the patient,thus  another good reason to lose weight.   ?Labs: Will repeat hepatitis B and C serology, transferring and alpha 1-antitrypsin ?Vitamin D deficiency: Completed ergocalciferol, now on daily supplements.  Labs. ?Morbid obesity: Has been unable to go back to the wellness clinic however is making some effort to lose weight, lost 4 pounds.  Encourage a structure program until he is able to go back to the wellness clinic. ?RTC CPX December 2023 ?  ? ? ?This visit occurred during the SARS-CoV-2 public health emergency.  Safety protocols were in place, including screening questions prior to the visit, additional usage of staff PPE, and extensive cleaning of exam room while observing appropriate contact time as indicated for disinfecting solutions.  ? ?

## 2021-10-01 NOTE — Patient Instructions (Addendum)
Please consider follow-up structured diet such as ?NOOM ?Weight watchers ?GoLo ? ?GO TO THE LAB : Get the blood work   ? ? ?[place], [place] ?Come back for a physical by 05-2022 ?

## 2021-10-01 NOTE — Assessment & Plan Note (Signed)
Dyslipidemia: On fenofibrate, last triglyceride were  450.  Risk of pancreatitis and other problems discussed, strongly recommend healthy diet.  Recheck labs today. ?Increased LFTs:  ?He is a social drinker, not taking Tylenol, suspect  fatty liver (when he lost weight,  LFTs were almost normal). ?This was explained to the patient,thus  another good reason to lose weight.   ?Labs: Will repeat hepatitis B and C serology, transferring and alpha 1-antitrypsin ?Vitamin D deficiency: Completed ergocalciferol, now on daily supplements.  Labs. ?Morbid obesity: Has been unable to go back to the wellness clinic however is making some effort to lose weight, lost 4 pounds.  Encourage a structure program until he is able to go back to the wellness clinic. ?RTC CPX December 2023 ?  ? ?

## 2021-10-04 ENCOUNTER — Encounter: Payer: Self-pay | Admitting: Internal Medicine

## 2021-10-04 LAB — HEPATITIS C ANTIBODY
Hepatitis C Ab: NONREACTIVE
SIGNAL TO CUT-OFF: 0.11 (ref <1.00)

## 2021-10-04 LAB — HEPATITIS B SURFACE ANTIBODY,QUALITATIVE: Hep B S Ab: REACTIVE — AB

## 2021-10-04 LAB — HEPATITIS B CORE ANTIBODY, TOTAL: Hep B Core Total Ab: NONREACTIVE

## 2021-10-04 LAB — HEPATITIS B SURFACE ANTIGEN: Hepatitis B Surface Ag: NONREACTIVE

## 2021-10-04 LAB — ALPHA-1-ANTITRYPSIN: A-1 Antitrypsin, Ser: 112 mg/dL (ref 83–199)

## 2021-11-22 DIAGNOSIS — Z0289 Encounter for other administrative examinations: Secondary | ICD-10-CM

## 2021-11-23 ENCOUNTER — Ambulatory Visit (INDEPENDENT_AMBULATORY_CARE_PROVIDER_SITE_OTHER): Payer: BC Managed Care – PPO | Admitting: Bariatrics

## 2021-12-06 ENCOUNTER — Encounter: Payer: Self-pay | Admitting: Internal Medicine

## 2021-12-07 MED ORDER — FENOFIBRATE 160 MG PO TABS: 160.0000 mg | ORAL_TABLET | Freq: Every day | ORAL | 1 refills | Status: DC

## 2021-12-23 ENCOUNTER — Ambulatory Visit (INDEPENDENT_AMBULATORY_CARE_PROVIDER_SITE_OTHER): Payer: BC Managed Care – PPO | Admitting: Bariatrics

## 2021-12-23 ENCOUNTER — Encounter (INDEPENDENT_AMBULATORY_CARE_PROVIDER_SITE_OTHER): Payer: Self-pay | Admitting: Bariatrics

## 2021-12-23 VITALS — BP 120/85 | HR 91 | Temp 97.8°F | Ht 68.0 in | Wt 258.0 lb

## 2021-12-23 DIAGNOSIS — R0602 Shortness of breath: Secondary | ICD-10-CM | POA: Diagnosis not present

## 2021-12-23 DIAGNOSIS — E559 Vitamin D deficiency, unspecified: Secondary | ICD-10-CM

## 2021-12-23 DIAGNOSIS — Z0000 Encounter for general adult medical examination without abnormal findings: Secondary | ICD-10-CM | POA: Insufficient documentation

## 2021-12-23 DIAGNOSIS — E669 Obesity, unspecified: Secondary | ICD-10-CM

## 2021-12-23 DIAGNOSIS — E782 Mixed hyperlipidemia: Secondary | ICD-10-CM | POA: Diagnosis not present

## 2021-12-23 DIAGNOSIS — E668 Other obesity: Secondary | ICD-10-CM

## 2021-12-23 DIAGNOSIS — E785 Hyperlipidemia, unspecified: Secondary | ICD-10-CM | POA: Diagnosis not present

## 2021-12-23 DIAGNOSIS — Z1331 Encounter for screening for depression: Secondary | ICD-10-CM

## 2021-12-23 DIAGNOSIS — R748 Abnormal levels of other serum enzymes: Secondary | ICD-10-CM | POA: Diagnosis not present

## 2021-12-23 DIAGNOSIS — R5383 Other fatigue: Secondary | ICD-10-CM | POA: Diagnosis not present

## 2021-12-23 DIAGNOSIS — R7309 Other abnormal glucose: Secondary | ICD-10-CM | POA: Diagnosis not present

## 2021-12-23 DIAGNOSIS — Z6839 Body mass index (BMI) 39.0-39.9, adult: Secondary | ICD-10-CM

## 2021-12-24 LAB — COMPREHENSIVE METABOLIC PANEL
ALT: 47 IU/L — ABNORMAL HIGH (ref 0–44)
AST: 40 IU/L (ref 0–40)
Albumin/Globulin Ratio: 1.6 (ref 1.2–2.2)
Albumin: 4.1 g/dL (ref 4.0–5.0)
Alkaline Phosphatase: 110 IU/L (ref 44–121)
BUN/Creatinine Ratio: 11 (ref 9–20)
BUN: 13 mg/dL (ref 6–24)
Bilirubin Total: 0.4 mg/dL (ref 0.0–1.2)
CO2: 18 mmol/L — ABNORMAL LOW (ref 20–29)
Calcium: 9.1 mg/dL (ref 8.7–10.2)
Chloride: 101 mmol/L (ref 96–106)
Creatinine, Ser: 1.17 mg/dL (ref 0.76–1.27)
Globulin, Total: 2.6 g/dL (ref 1.5–4.5)
Glucose: 89 mg/dL (ref 70–99)
Potassium: 3.9 mmol/L (ref 3.5–5.2)
Sodium: 137 mmol/L (ref 134–144)
Total Protein: 6.7 g/dL (ref 6.0–8.5)
eGFR: 81 mL/min/{1.73_m2} (ref >59)

## 2021-12-24 LAB — VITAMIN D 25 HYDROXY (VIT D DEFICIENCY, FRACTURES): Vit D, 25-Hydroxy: 18.3 ng/mL — ABNORMAL LOW (ref 30.0–100.0)

## 2021-12-24 LAB — LIPID PANEL WITH LDL/HDL RATIO
Cholesterol, Total: 326 mg/dL — ABNORMAL HIGH (ref 100–199)
HDL: 23 mg/dL — ABNORMAL LOW (ref >39)
Triglycerides: 1610 mg/dL (ref 0–149)

## 2021-12-24 LAB — HEMOGLOBIN A1C
Est. average glucose Bld gHb Est-mCnc: 108 mg/dL
Hgb A1c MFr Bld: 5.4 % (ref 4.8–5.6)

## 2021-12-24 LAB — TSH+T4F+T3FREE
Free T4: 1.01 ng/dL (ref 0.82–1.77)
T3, Free: 3.5 pg/mL (ref 2.0–4.4)
TSH: 4.1 u[IU]/mL (ref 0.450–4.500)

## 2021-12-24 LAB — INSULIN, RANDOM: INSULIN: 9.1 u[IU]/mL (ref 2.6–24.9)

## 2021-12-27 NOTE — Progress Notes (Unsigned)
Chief Complaint:   OBESITY Charles Diaz (MR# [id number]) is a 41 y.o. male who presents for evaluation and treatment of obesity and related comorbidities. Current BMI is Body mass index is 39.23 kg/m. Charles Diaz has been struggling with his weight for many years and has been unsuccessful in either losing weight, maintaining weight loss, or reaching his healthy weight goal.  Charles Diaz is a returning patient to our clinic.  He states that he got crazy with COVID.  Charles Diaz is currently in the action stage of change and ready to dedicate time achieving and maintaining a healthier weight. Charles Diaz is interested in becoming our patient and working on intensive lifestyle modifications including (but not limited to) diet and exercise for weight loss.  Charles Diaz's habits were reviewed today and are as follows: His family eats meals together, he thinks his family will eat healthier with him, his desired weight loss is 58 lbs, he started gaining weight in 2010, his heaviest weight ever was 260 pounds, he has significant food cravings issues, he is frequently drinking liquids with calories, and he frequently eats larger portions than normal.  Depression Screen Charles Diaz's Food and Mood (modified PHQ-9) score was 4.     12/23/2021    7:07 AM  Depression screen PHQ 2/9  Decreased Interest 1  Down, Depressed, Hopeless 0  PHQ - 2 Score 1  Altered sleeping 1  Tired, decreased energy 1  Change in appetite 0  Feeling bad or failure about yourself  0  Trouble concentrating 1  Moving slowly or fidgety/restless 0  Suicidal thoughts 0  PHQ-9 Score 4  Difficult doing work/chores Not difficult at all   Subjective:   1. Other fatigue Charles Diaz admits to daytime somnolence and admits to waking up still tired. Patient has a history of symptoms of daytime fatigue and morning fatigue. Charles Diaz generally gets 7 hours of sleep per night, and states that he has generally restful sleep. Snoring is present. Apneic episodes are not present.  Epworth Sleepiness Score is 3.   2. SOB (shortness of breath) on exertion Charles Diaz notes increasing shortness of breath with exercising and seems to be worsening over time with weight gain. He notes getting out of breath sooner with activity than he used to. This has not gotten worse recently. Charles Diaz denies shortness of breath at rest or orthopnea.  3. Elevated triglycerides with high cholesterol Charles Diaz is taking fenofibrate.   4. Vitamin D deficiency Charles Diaz is taking vitamin D and multivitamins.  5. Health care maintenance Charles Diaz is due for labs.  6. Elevated liver enzymes Charles Diaz's decreased liver enzymes has improved.  7. Dyslipidemia Charles Diaz is taking fenofibrate.   8. Elevated glucose Charles Diaz is not on medications currently.  Assessment/Plan:   1. Other fatigue Charles Diaz does feel that his weight is causing his energy to be lower than it should be. Fatigue may be related to obesity, depression or many other causes. Labs will be ordered, and in the meanwhile, Charles Diaz will focus on self care including making healthy food choices, increasing physical activity and focusing on stress reduction.  - EKG 12-Lead - TSH+T4F+T3Free  2. SOB (shortness of breath) on exertion Charles Diaz does feel that he gets out of breath more easily that he used to when he exercises. Charles Diaz's shortness of breath appears to be obesity related and exercise induced. He has agreed to work on weight loss and gradually increase exercise to treat his exercise induced shortness of breath. Will continue to monitor closely.  3. Elevated  triglycerides with high cholesterol We will check labs today, and we will follow-up at Charles Diaz's next visit.  He will continue his medications as directed.  4. Vitamin D deficiency We will check labs today, and we will follow-up at Charles Diaz's next visit.  - VITAMIN D 25 Hydroxy (Vit-D Deficiency, Fractures)  5. Health care maintenance We will check labs today, and we will follow-up at Charles Diaz's next visit.  -  Comprehensive metabolic panel - Hemoglobin A1c - Insulin, random - TSH+T4F+T3Free - VITAMIN D 25 Hydroxy (Vit-D Deficiency, Fractures) - Lipid Panel With LDL/HDL Ratio  6. Elevated liver enzymes We will check labs today, and we will follow-up at Charles Diaz's next visit. - Comprehensive metabolic panel  7. Dyslipidemia We will check labs today, and we will follow-up at Charles Diaz's next visit.  He will continue his medications as directed.  - Comprehensive metabolic panel - Lipid Panel With LDL/HDL Ratio  8. Elevated glucose We will check labs today, and we will follow-up at Charles Diaz's next visit.  - Hemoglobin A1c - Insulin, random  9. Depression screening Charles Diaz had a negative depression screening. Depression is commonly associated with obesity and often results in emotional eating behaviors. We will monitor this closely and work on CBT to help improve the non-hunger eating patterns. Referral to Psychology may be required if no improvement is seen as he continues in our clinic.  10. Class 2 obesity without serious comorbidity with body mass index (BMI) of 39.0 to 39.9 in adult, unspecified obesity type Charles Diaz is currently in the action stage of change and his goal is to continue with weight loss efforts. I recommend Charles Diaz begin the structured treatment plan as follows:  He has agreed to the Category 3 Plan.  No planning was discussed.  Reviewed labs with the patient from 09/18/2021, BMP, lipids, vitamin D.  Increase water intake.  Exercise goals: He does weight lifting, he has an appointment with a coach, and soccer.  Behavioral modification strategies: increasing lean protein intake, decreasing simple carbohydrates, increasing vegetables, increasing water intake, decreasing eating out, no skipping meals, meal planning and cooking strategies, keeping healthy foods in the home, and planning for success.  He was informed of the importance of frequent follow-up visits to maximize his success with  intensive lifestyle modifications for his multiple health conditions. He was informed we would discuss his lab results at his next visit unless there is a critical issue that needs to be addressed sooner. Charles Diaz agreed to keep his next visit at the agreed upon time to discuss these results.  Objective:   Blood pressure 120/85, pulse 91, temperature 97.8 F (36.6 C), height 5\' 8"  (1.727 m), weight 258 lb (117 kg), SpO2 96 %. Body mass index is 39.23 kg/m.  EKG: Normal sinus rhythm, rate 81 BPM.  Indirect Calorimeter completed today shows a VO2 of 311 and a REE of 2146.  His calculated basal metabolic rate is [id number] thus his basal metabolic rate is worse than expected.  General: Cooperative, alert, well developed, in no acute distress. HEENT: Conjunctivae and lids unremarkable. Cardiovascular: Regular rhythm.  Lungs: Normal work of breathing. Neurologic: No focal deficits.   Lab Results  Component Value Date   CREATININE 1.17 12/23/2021   BUN 13 12/23/2021   NA 137 12/23/2021   K 3.9 12/23/2021   CL 101 12/23/2021   CO2 18 (L) 12/23/2021   Lab Results  Component Value Date   ALT 47 (H) 12/23/2021   AST 40 12/23/2021   ALKPHOS 110 12/23/2021  BILITOT 0.4 12/23/2021   Lab Results  Component Value Date   HGBA1C 5.4 12/23/2021   HGBA1C 5.4 01/27/2020   HGBA1C 5.3 02/23/2018   Lab Results  Component Value Date   INSULIN 9.1 12/23/2021   INSULIN 15.5 07/16/2020   INSULIN 15.4 03/16/2020   INSULIN 14.4 01/27/2020   Lab Results  Component Value Date   TSH 4.100 12/23/2021   Lab Results  Component Value Date   CHOL 326 (H) 12/23/2021   HDL 23 (L) 12/23/2021   LDLCALC Comment (A) 12/23/2021   LDLDIRECT 52.0 05/26/2021   TRIG 1,610 (HH) 12/23/2021   CHOLHDL 4 10/01/2021   Lab Results  Component Value Date   WBC 7.4 05/26/2021   HGB 15.1 05/26/2021   HCT 44.1 05/26/2021   MCV 85.8 05/26/2021   PLT 227.0 05/26/2021   Lab Results  Component Value Date   IRON 89  10/01/2021   TIBC 357.0 10/01/2021   FERRITIN 72.9 10/01/2021   Attestation Statements:   Reviewed by clinician on day of visit: allergies, medications, problem list, medical history, surgical history, family history, social history, and previous encounter notes.   Charles Diaz, am acting as Energy manager for [employer], DO.  I have reviewed the above documentation for accuracy and completeness, and I agree with the above. Corinna Capra, DO

## 2021-12-28 ENCOUNTER — Encounter (INDEPENDENT_AMBULATORY_CARE_PROVIDER_SITE_OTHER): Payer: Self-pay | Admitting: Bariatrics

## 2021-12-28 NOTE — Progress Notes (Signed)
Notified patient of Dr. Manson Passey recommendations. Patient states he does take Fenofibrate and has never had a statin before. Verified patient pharmacy.

## 2021-12-29 ENCOUNTER — Other Ambulatory Visit (INDEPENDENT_AMBULATORY_CARE_PROVIDER_SITE_OTHER): Payer: Self-pay | Admitting: Bariatrics

## 2021-12-29 DIAGNOSIS — E782 Mixed hyperlipidemia: Secondary | ICD-10-CM

## 2021-12-29 MED ORDER — OMEGA-3-ACID ETHYL ESTERS 1 G PO CAPS: 2.0000 g | ORAL_CAPSULE | Freq: Two times a day (BID) | ORAL | 0 refills | Status: DC

## 2021-12-30 ENCOUNTER — Encounter (INDEPENDENT_AMBULATORY_CARE_PROVIDER_SITE_OTHER): Payer: Self-pay

## 2021-12-30 ENCOUNTER — Telehealth (INDEPENDENT_AMBULATORY_CARE_PROVIDER_SITE_OTHER): Payer: Self-pay | Admitting: Bariatrics

## 2021-12-30 NOTE — Progress Notes (Signed)
Notified patient his prescription was sent to pharmacy. Patient verbalized understanding.

## 2021-12-30 NOTE — Telephone Encounter (Signed)
Dr. Manson Passey - Prior authorization approved for Omega-3-acid Ethyl Esters. Effective: 11/30/2021 - 12/30/2022. Patient sent approval message via mychart.

## 2022-01-06 ENCOUNTER — Ambulatory Visit (INDEPENDENT_AMBULATORY_CARE_PROVIDER_SITE_OTHER): Payer: BC Managed Care – PPO | Admitting: Bariatrics

## 2022-01-06 ENCOUNTER — Encounter (INDEPENDENT_AMBULATORY_CARE_PROVIDER_SITE_OTHER): Payer: Self-pay | Admitting: Bariatrics

## 2022-01-06 VITALS — BP 108/74 | HR 68 | Temp 97.5°F | Ht 68.0 in | Wt 255.0 lb

## 2022-01-06 DIAGNOSIS — E669 Obesity, unspecified: Secondary | ICD-10-CM

## 2022-01-06 DIAGNOSIS — E785 Hyperlipidemia, unspecified: Secondary | ICD-10-CM | POA: Diagnosis not present

## 2022-01-06 DIAGNOSIS — Z6838 Body mass index (BMI) 38.0-38.9, adult: Secondary | ICD-10-CM | POA: Diagnosis not present

## 2022-01-06 DIAGNOSIS — E559 Vitamin D deficiency, unspecified: Secondary | ICD-10-CM | POA: Diagnosis not present

## 2022-01-12 NOTE — Progress Notes (Signed)
Chief Complaint:   OBESITY Maxton is here to discuss his progress with his obesity treatment plan along with follow-up of his obesity related diagnoses. Gurman is on the Category 3 Plan and states he is following his eating plan approximately 80% of the time. Amato states he is on the treadmill and lifting weights for 30 minutes 2 times per week.  Today's visit was #: 2 Starting weight: 258 lbs Starting date: 12/23/2021 Today's weight: 255 lbs Today's date: 01/06/2022 Total lbs lost to date: 3 Total lbs lost since last in-office visit: 3  Interim History: Satoru is down 3 pounds since his first visit.  He had been on a trip for 3 days.  He is drinking more water.  Subjective:   1. Vitamin D deficiency Raif is taking 5000 units of vitamin D once daily.  Vitamin D level was 18.3.  2. Dyslipidemia Agustine is on fenofibric and added Lovaza after last visit.  Triglycerides are 1,610, LDL 326, and HDL 23.  Assessment/Plan:   1. Vitamin D deficiency Shaye will continue OTC vitamin D 5000 units once daily.  2. Dyslipidemia Discussed risk of pancreatitis and other risk secondary to high triglycerides.  Hernandez will consider going to a lipid specialist in the future.  3. Obesity, Current BMI 38.8 Jada is currently in the action stage of change. As such, his goal is to continue with weight loss efforts. He has agreed to the Category 3 Plan.   Meal planning was discussed.  Discussed labs with the patient from 12/23/2021, CMP, lipids, vitamin D, A1c, insulin, and thyroid panel.  He is to increase his vegetable and water intake.  Exercise goals: As is.   Behavioral modification strategies: increasing lean protein intake, decreasing simple carbohydrates, increasing vegetables, increasing water intake, decreasing eating out, no skipping meals, meal planning and cooking strategies, keeping healthy foods in the home, and planning for success.  Vedanth has agreed to follow-up with our clinic in 2 to 3 weeks.  He was informed of the importance of frequent follow-up visits to maximize his success with intensive lifestyle modifications for his multiple health conditions.   Objective:   Blood pressure 108/74, pulse 68, temperature (!) 97.5 F (36.4 C), height 5\' 8"  (1.727 m), weight 255 lb (115.7 kg), SpO2 96 %. Body mass index is 38.77 kg/m.  General: Cooperative, alert, well developed, in no acute distress. HEENT: Conjunctivae and lids unremarkable. Cardiovascular: Regular rhythm.  Lungs: Normal work of breathing. Neurologic: No focal deficits.   Lab Results  Component Value Date   CREATININE 1.17 12/23/2021   BUN 13 12/23/2021   NA 137 12/23/2021   K 3.9 12/23/2021   CL 101 12/23/2021   CO2 18 (L) 12/23/2021   Lab Results  Component Value Date   ALT 47 (H) 12/23/2021   AST 40 12/23/2021   ALKPHOS 110 12/23/2021   BILITOT 0.4 12/23/2021   Lab Results  Component Value Date   HGBA1C 5.4 12/23/2021   HGBA1C 5.4 01/27/2020   HGBA1C 5.3 02/23/2018   Lab Results  Component Value Date   INSULIN 9.1 12/23/2021   INSULIN 15.5 07/16/2020   INSULIN 15.4 03/16/2020   INSULIN 14.4 01/27/2020   Lab Results  Component Value Date   TSH 4.100 12/23/2021   Lab Results  Component Value Date   CHOL 326 (H) 12/23/2021   HDL 23 (L) 12/23/2021   LDLCALC Comment (A) 12/23/2021   LDLDIRECT 52.0 05/26/2021   TRIG 1,610 (HH) 12/23/2021   CHOLHDL  4 10/01/2021   Lab Results  Component Value Date   VD25OH 18.3 (L) 12/23/2021   VD25OH 37.64 10/01/2021   VD25OH 23.34 (L) 05/26/2021   Lab Results  Component Value Date   WBC 7.4 05/26/2021   HGB 15.1 05/26/2021   HCT 44.1 05/26/2021   MCV 85.8 05/26/2021   PLT 227.0 05/26/2021   Lab Results  Component Value Date   IRON 89 10/01/2021   TIBC 357.0 10/01/2021   FERRITIN 72.9 10/01/2021   Attestation Statements:   Reviewed by clinician on day of visit: allergies, medications, problem list, medical history, surgical history, family  history, social history, and previous encounter notes.  Trude Mcburney, am acting as Energy manager for [employer], DO.  I have reviewed the above documentation for accuracy and completeness, and I agree with the above. Corinna Capra, DO

## 2022-01-19 ENCOUNTER — Encounter (INDEPENDENT_AMBULATORY_CARE_PROVIDER_SITE_OTHER): Payer: Self-pay | Admitting: Bariatrics

## 2022-01-26 ENCOUNTER — Encounter (INDEPENDENT_AMBULATORY_CARE_PROVIDER_SITE_OTHER): Payer: Self-pay

## 2022-02-07 ENCOUNTER — Ambulatory Visit (INDEPENDENT_AMBULATORY_CARE_PROVIDER_SITE_OTHER): Payer: BC Managed Care – PPO | Admitting: Bariatrics

## 2022-02-07 ENCOUNTER — Encounter (INDEPENDENT_AMBULATORY_CARE_PROVIDER_SITE_OTHER): Payer: Self-pay | Admitting: Bariatrics

## 2022-02-07 VITALS — BP 102/69 | HR 60 | Temp 97.4°F | Ht 68.0 in | Wt 251.0 lb

## 2022-02-07 DIAGNOSIS — E669 Obesity, unspecified: Secondary | ICD-10-CM | POA: Diagnosis not present

## 2022-02-07 DIAGNOSIS — E8881 Metabolic syndrome: Secondary | ICD-10-CM

## 2022-02-07 DIAGNOSIS — E782 Mixed hyperlipidemia: Secondary | ICD-10-CM

## 2022-02-07 DIAGNOSIS — E88819 Insulin resistance, unspecified: Secondary | ICD-10-CM | POA: Insufficient documentation

## 2022-02-07 DIAGNOSIS — Z6838 Body mass index (BMI) 38.0-38.9, adult: Secondary | ICD-10-CM | POA: Diagnosis not present

## 2022-02-07 MED ORDER — OMEGA-3-ACID ETHYL ESTERS 1 G PO CAPS: 2.0000 g | ORAL_CAPSULE | Freq: Two times a day (BID) | ORAL | 0 refills | Status: DC

## 2022-02-14 NOTE — Progress Notes (Unsigned)
Chief Complaint:   OBESITY Charles Diaz is here to discuss his progress with his obesity treatment plan along with follow-up of his obesity related diagnoses. Charles Diaz is on the Category 3 Plan and states he is following his eating plan approximately 80% of the time. Charles Diaz states he is playing soccer and lifting weights for 30 minutes 2-3 times per week.  Today's visit was #: 3 Starting weight: 258 lbs Starting date: 12/23/2021 Today's weight: 251 lbs Today's date: 02/07/22 Total lbs lost to date: 7 Total lbs lost since last in-office visit: -4  Interim History: He is down another 4 pounds since his last visit.  He has been on vacation.  He has been consistent on his diet.  Subjective:   1. Elevated triglycerides with high cholesterol Taking fenofibrate and Lovaza.  Triglycerides 212 at last lab draw.   2. Insulin resistance No medication.  Assessment/Plan:   1. Elevated triglycerides with high cholesterol 1. Refill - omega-3 acid ethyl esters (LOVAZA) 1 g capsule; Take 2 capsules (2 g total) by mouth 2 (two) times daily.  Dispense: 120 capsule; Refill: 0  2.  Check triglycerides in the near future.   2. Insulin resistance Will keep all carbohydrates low (starches and sweets).  3. Obesity, Current BMI 38.2 1.  Meal planning 2.  Intentional eating. 3.  We will keep water and protein high.  Charles Diaz is currently in the action stage of change. As such, his goal is to continue with weight loss efforts. He has agreed to the Category 3 Plan.   Exercise goals: Increase exercise.  Behavioral modification strategies: increasing lean protein intake, decreasing simple carbohydrates, increasing vegetables, increasing water intake, decreasing eating out, no skipping meals, meal planning and cooking strategies, keeping healthy foods in the home, and planning for success.  Charles Diaz has agreed to follow-up with our clinic in 3 weeks with [facility] or Dr. Cathey Endow (fasting labs, check triglycerides). He was  informed of the importance of frequent follow-up visits to maximize his success with intensive lifestyle modifications for his multiple health conditions.   Objective:   Blood pressure 102/69, pulse 60, temperature (!) 97.4 F (36.3 C), height 5\' 8"  (1.727 m), weight 251 lb (113.9 kg), SpO2 98 %. Body mass index is 38.16 kg/m.  General: Cooperative, alert, well developed, in no acute distress. HEENT: Conjunctivae and lids unremarkable. Cardiovascular: Regular rhythm.  Lungs: Normal work of breathing. Neurologic: No focal deficits.   Lab Results  Component Value Date   CREATININE 1.17 12/23/2021   BUN 13 12/23/2021   NA 137 12/23/2021   K 3.9 12/23/2021   CL 101 12/23/2021   CO2 18 (L) 12/23/2021   Lab Results  Component Value Date   ALT 47 (H) 12/23/2021   AST 40 12/23/2021   ALKPHOS 110 12/23/2021   BILITOT 0.4 12/23/2021   Lab Results  Component Value Date   HGBA1C 5.4 12/23/2021   HGBA1C 5.4 01/27/2020   HGBA1C 5.3 02/23/2018   Lab Results  Component Value Date   INSULIN 9.1 12/23/2021   INSULIN 15.5 07/16/2020   INSULIN 15.4 03/16/2020   INSULIN 14.4 01/27/2020   Lab Results  Component Value Date   TSH 4.100 12/23/2021   Lab Results  Component Value Date   CHOL 326 (H) 12/23/2021   HDL 23 (L) 12/23/2021   LDLCALC Comment (A) 12/23/2021   LDLDIRECT 52.0 05/26/2021   TRIG 1,610 (HH) 12/23/2021   CHOLHDL 4 10/01/2021   Lab Results  Component Value Date  VD25OH 18.3 (L) 12/23/2021   VD25OH 37.64 10/01/2021   VD25OH 23.34 (L) 05/26/2021   Lab Results  Component Value Date   WBC 7.4 05/26/2021   HGB 15.1 05/26/2021   HCT 44.1 05/26/2021   MCV 85.8 05/26/2021   PLT 227.0 05/26/2021   Lab Results  Component Value Date   IRON 89 10/01/2021   TIBC 357.0 10/01/2021   FERRITIN 72.9 10/01/2021    Attestation Statements:   Reviewed by clinician on day of visit: allergies, medications, problem list, medical history, surgical history, family  history, social history, and previous encounter notes.  I, Dawn Whitmire, FNP-C, am acting as transcriptionist for Dr. Corinna Capra.  I have reviewed the above documentation for accuracy and completeness, and I agree with the above. Corinna Capra, DO

## 2022-02-15 ENCOUNTER — Encounter (INDEPENDENT_AMBULATORY_CARE_PROVIDER_SITE_OTHER): Payer: Self-pay | Admitting: Bariatrics

## 2022-03-01 ENCOUNTER — Ambulatory Visit (INDEPENDENT_AMBULATORY_CARE_PROVIDER_SITE_OTHER): Payer: BC Managed Care – PPO | Admitting: Adult Health

## 2022-03-01 ENCOUNTER — Encounter (INDEPENDENT_AMBULATORY_CARE_PROVIDER_SITE_OTHER): Payer: Self-pay | Admitting: Adult Health

## 2022-03-01 VITALS — BP 117/79 | HR 56 | Temp 97.6°F | Ht 68.0 in | Wt 250.0 lb

## 2022-03-01 DIAGNOSIS — E669 Obesity, unspecified: Secondary | ICD-10-CM

## 2022-03-01 DIAGNOSIS — E782 Mixed hyperlipidemia: Secondary | ICD-10-CM | POA: Diagnosis not present

## 2022-03-01 DIAGNOSIS — E8881 Metabolic syndrome: Secondary | ICD-10-CM

## 2022-03-01 DIAGNOSIS — Z6838 Body mass index (BMI) 38.0-38.9, adult: Secondary | ICD-10-CM

## 2022-03-06 NOTE — Progress Notes (Addendum)
Chief Complaint:   OBESITY Triton is here to discuss his progress with his obesity treatment plan along with follow-up of his obesity related diagnoses. Eliav is on the Category 3 Plan and states he is following his eating plan approximately 80% of the time. Lonie states he is weights and cardio 30 minutes 3 times per week.  Today's visit was #: 4 Starting weight: 258 lbs Starting date: 12/23/2021 Today's weight: 250 lbs Today's date: 03/01/2022 Total lbs lost to date: 8 lbs Total lbs lost since last in-office visit: 1 lb  Interim History:  Initial Start with [facility] 01/27/2020 Restarted [facility] 12/23/2021  Mr. Sachdeva provided the following food recall typical of day: Breakfast:  2 eggs, 2 toast, 1 cheese, fair life milk. Yogurt. Lunch:  4 oz meat sandwich, lettuce, cheese, apple, yogurt. Dinner:  8 oz protein, (chicken and steak), 1 cup veggies. Snack:  1/3 hummus, carrots.   Subjective:   1. Insulin resistance He denies 1st degree family history of T2D.   2. Elevated cholesterol with high triglycerides June 28-July 3rd, trip to [place]- he reports increased ETOH use. Lipid Panel     Component Value Date/Time   CHOL 326 (H) 12/23/2021 0836   TRIG 1,610 (HH) 12/23/2021 0836   HDL 23 (L) 12/23/2021 0836   CHOLHDL 4 10/01/2021 1039   VLDL 30.8 10/01/2021 1039   LDLCALC Comment (A) 12/23/2021 0836   LDLDIRECT 52.0 05/26/2021 1119   LABVLDL Comment (A) 12/23/2021 0836   02/07/22 started on Lovaza 42m 2 caps BID  Assessment/Plan:   1. Insulin resistance Check labs at next office visit.   2. Elevated cholesterol with elevated triglycerides Check labs at next office visit.   3. Obesity, Current BMI 38.0 RMaxximusis currently in the action stage of change. As such, his goal is to continue with weight loss efforts. He has agreed to the Category 3 Plan.   Exercise goals:  as is.   Behavioral modification strategies: increasing lean protein intake, decreasing simple carbohydrates,  meal planning and cooking strategies, keeping healthy foods in the home, and planning for success.  RNemesiohas agreed to follow-up with our clinic in 2-3 weeks. He was informed of the importance of frequent follow-up visits to maximize his success with intensive lifestyle modifications for his multiple health conditions.   Objective:   Blood pressure 117/79, pulse (!) 56, temperature 97.6 F (36.4 C), height 5' 8"$  (1.727 m), weight 250 lb (113.4 kg), SpO2 98 %. Body mass index is 38.01 kg/m.  General: Cooperative, alert, well developed, in no acute distress. HEENT: Conjunctivae and lids unremarkable. Cardiovascular: Regular rhythm.  Lungs: Normal work of breathing. Neurologic: No focal deficits.   Lab Results  Component Value Date   CREATININE 1.17 12/23/2021   BUN 13 12/23/2021   NA 137 12/23/2021   K 3.9 12/23/2021   CL 101 12/23/2021   CO2 18 (L) 12/23/2021   Lab Results  Component Value Date   ALT 47 (H) 12/23/2021   AST 40 12/23/2021   ALKPHOS 110 12/23/2021   BILITOT 0.4 12/23/2021   Lab Results  Component Value Date   HGBA1C 5.4 12/23/2021   HGBA1C 5.4 01/27/2020   HGBA1C 5.3 02/23/2018   Lab Results  Component Value Date   INSULIN 9.1 12/23/2021   INSULIN 15.5 07/16/2020   INSULIN 15.4 03/16/2020   INSULIN 14.4 01/27/2020   Lab Results  Component Value Date   TSH 4.100 12/23/2021   Lab Results  Component Value Date  CHOL 326 (H) 12/23/2021   HDL 23 (L) 12/23/2021   LDLCALC Comment (A) 12/23/2021   LDLDIRECT 52.0 05/26/2021   TRIG 1,610 (HH) 12/23/2021   CHOLHDL 4 10/01/2021   Lab Results  Component Value Date   VD25OH 18.3 (L) 12/23/2021   VD25OH 37.64 10/01/2021   VD25OH 23.34 (L) 05/26/2021   Lab Results  Component Value Date   WBC 7.4 05/26/2021   HGB 15.1 05/26/2021   HCT 44.1 05/26/2021   MCV 85.8 05/26/2021   PLT 227.0 05/26/2021   Lab Results  Component Value Date   IRON 89 10/01/2021   TIBC 357.0 10/01/2021   FERRITIN 72.9  10/01/2021   Attestation Statements:   Reviewed by clinician on day of visit: allergies, medications, problem list, medical history, surgical history, family history, social history, and previous encounter notes.  I, Davy Pique, RMA, am acting as Location manager for Mina Marble, NP.   I have reviewed the above documentation for accuracy and completeness, and I agree with the above. -  Jailen Lung d. Carlyon Nolasco, NP-C

## 2022-03-30 ENCOUNTER — Ambulatory Visit (INDEPENDENT_AMBULATORY_CARE_PROVIDER_SITE_OTHER): Payer: BC Managed Care – PPO | Admitting: Adult Health

## 2022-05-18 ENCOUNTER — Ambulatory Visit (INDEPENDENT_AMBULATORY_CARE_PROVIDER_SITE_OTHER): Payer: BC Managed Care – PPO | Admitting: Family Medicine

## 2022-05-18 ENCOUNTER — Encounter (INDEPENDENT_AMBULATORY_CARE_PROVIDER_SITE_OTHER): Payer: Self-pay | Admitting: Family Medicine

## 2022-05-18 ENCOUNTER — Telehealth (INDEPENDENT_AMBULATORY_CARE_PROVIDER_SITE_OTHER): Payer: BC Managed Care – PPO | Admitting: Family Medicine

## 2022-05-18 DIAGNOSIS — E781 Pure hyperglyceridemia: Secondary | ICD-10-CM | POA: Insufficient documentation

## 2022-05-18 DIAGNOSIS — E669 Obesity, unspecified: Secondary | ICD-10-CM

## 2022-05-18 DIAGNOSIS — Z6838 Body mass index (BMI) 38.0-38.9, adult: Secondary | ICD-10-CM | POA: Diagnosis not present

## 2022-05-18 DIAGNOSIS — E559 Vitamin D deficiency, unspecified: Secondary | ICD-10-CM

## 2022-05-18 MED ORDER — VITAMIN D 125 MCG (5000 UT) PO CAPS: 1.0000 | ORAL_CAPSULE | Freq: Every day | ORAL | 0 refills | Status: DC

## 2022-05-18 NOTE — Progress Notes (Signed)
TeleHealth Visit:  This visit was completed with telemedicine (audio/video) technology. Charles Diaz has verbally consented to this TeleHealth visit. The patient is located at home, the provider is located at home. The participants in this visit include the listed provider and patient. The visit was conducted today via MyChart video.  OBESITY Charles Diaz is here to discuss his progress with his obesity treatment plan along with follow-up of his obesity related diagnoses.   Today's visit was # 5 Starting weight: 258 lbs Starting date: 12/23/2021 Weight at last in office visit: 250 lbs on 03/01/22 Total weight loss: 8 lbs at last in office visit on 03/01/22. Today's reported weight: 248 lbs   Nutrition Plan: the Category 3 Plan.   Current exercise:  plays soccer for an hour once weekly  Interim History: Has not been able to adhere to plan since his last appointment.  He says the holidays and recent travel have made it difficult.  He tends to eat foods that are convenient.  Often has leftovers or maybe a pancake for breakfast.  Does tend to stay on plan at lunch with a sandwich with 4 ounces of meat, fruit, yogurt.  Does not eat out much.  He and his wife generally cook dinner at home.  Water intake is low-30 ounces per day.  He denies excessive hunger or cravings.  Has a few cups of coffee with sugar in the a.m. but no other sugar sweetened beverages.  He has not been to the gym lately but does play soccer every week.  Assessment/Plan:  1.  Hypertriglyceridemia Last triglycerides were 1610 on 12/23/2021.  HDL ow at 23.  He is unsure if he has family history of high triglycerides. Medication(s): Lovaza 1 g twice daily, fenofibrate 160 mg daily.  Good compliance. Cardiovascular risk factors: dyslipidemia, male gender, obesity (BMI >= 30 kg/m2), and sedentary lifestyle  Lab Results  Component Value Date   CHOL 326 (H) 12/23/2021   HDL 23 (L) 12/23/2021   LDLCALC Comment (A) 12/23/2021   LDLDIRECT  52.0 05/26/2021   TRIG 1,610 (HH) 12/23/2021   CHOLHDL 4 10/01/2021   Lab Results  Component Value Date   ALT 47 (H) 12/23/2021   AST 40 12/23/2021   ALKPHOS 110 12/23/2021   BILITOT 0.4 12/23/2021   The ASCVD Risk score (Arnett DK, et al., 2019) failed to calculate for the following reasons:   The valid total cholesterol range is 130 to 320 mg/dL  Plan: Continue fenofibrate and Lovaza. Check lipid panel next office visit.  2. Vitamin D Deficiency Vitamin D is not at goal of 50.  Last vitamin D was 18.3 on 12/23/2021. He is on OTC vitamin D 5000 IU daily. Lab Results  Component Value Date   VD25OH 18.3 (L) 12/23/2021   VD25OH 37.64 10/01/2021   VD25OH 23.34 (L) 05/26/2021    Plan: Continue OTC vitamin D 5000 IU daily. Check vitamin D level next office visit.   3. Obesity: Current BMI 38 Charles Diaz is currently in the action stage of change. As such, his goal is to continue with weight loss efforts.  He has agreed to the Category 3 Plan.   Eliminate sugar from coffee. Encouraged better plan adherence. Keep water bottle at desk.  Exercise goals: He will try to get back to the gym to exercise.  Behavioral modification strategies: increasing lean protein intake, decreasing simple carbohydrates, and increasing water intake.  Charles Diaz has agreed to follow-up with our clinic in 3 weeks with Shawn Rayburn, PA with fasting  labs.  No orders of the defined types were placed in this encounter.   Medications Discontinued During This Encounter  Medication Reason   cholecalciferol (VITAMIN D3) 25 MCG (1000 UNIT) tablet Dose change     Meds ordered this encounter  Medications   Cholecalciferol (VITAMIN D) 125 MCG (5000 UT) CAPS    Sig: Take 1 capsule by mouth daily.    Dispense:  30 capsule    Refill:  0    Order Specific Question:   Supervising Provider    Answer:   Glennis Brink [[id number]]      Objective:   VITALS: Per patient if applicable, see vitals. GENERAL: Alert and in no  acute distress. CARDIOPULMONARY: No increased WOB. Speaking in clear sentences.  PSYCH: Pleasant and cooperative. Speech normal rate and rhythm. Affect is appropriate. Insight and judgement are appropriate. Attention is focused, linear, and appropriate.  NEURO: Oriented as arrived to appointment on time with no prompting.   Lab Results  Component Value Date   CREATININE 1.17 12/23/2021   BUN 13 12/23/2021   NA 137 12/23/2021   K 3.9 12/23/2021   CL 101 12/23/2021   CO2 18 (L) 12/23/2021   Lab Results  Component Value Date   ALT 47 (H) 12/23/2021   AST 40 12/23/2021   ALKPHOS 110 12/23/2021   BILITOT 0.4 12/23/2021   Lab Results  Component Value Date   HGBA1C 5.4 12/23/2021   HGBA1C 5.4 01/27/2020   HGBA1C 5.3 02/23/2018   Lab Results  Component Value Date   INSULIN 9.1 12/23/2021   INSULIN 15.5 07/16/2020   INSULIN 15.4 03/16/2020   INSULIN 14.4 01/27/2020   Lab Results  Component Value Date   TSH 4.100 12/23/2021   Lab Results  Component Value Date   CHOL 326 (H) 12/23/2021   HDL 23 (L) 12/23/2021   LDLCALC Comment (A) 12/23/2021   LDLDIRECT 52.0 05/26/2021   TRIG 1,610 (HH) 12/23/2021   CHOLHDL 4 10/01/2021   Lab Results  Component Value Date   WBC 7.4 05/26/2021   HGB 15.1 05/26/2021   HCT 44.1 05/26/2021   MCV 85.8 05/26/2021   PLT 227.0 05/26/2021   Lab Results  Component Value Date   IRON 89 10/01/2021   TIBC 357.0 10/01/2021   FERRITIN 72.9 10/01/2021   Lab Results  Component Value Date   VD25OH 18.3 (L) 12/23/2021   VD25OH 37.64 10/01/2021   VD25OH 23.34 (L) 05/26/2021    Attestation Statements:   Reviewed by clinician on day of visit: allergies, medications, problem list, medical history, surgical history, family history, social history, and previous encounter notes.  Time spent on visit including the items listed below was 35 minutes.  -preparing to see the patient (e.g., review of tests, history, previous notes) -obtaining and/or  reviewing separately obtained history -counseling and educating the patient/family/caregiver -documenting clinical information in the electronic or other health record -ordering medications, tests, or procedures -independently interpreting results and communicating results to the patient/ family/caregiver

## 2022-06-03 ENCOUNTER — Ambulatory Visit (INDEPENDENT_AMBULATORY_CARE_PROVIDER_SITE_OTHER): Payer: BC Managed Care – PPO | Admitting: Internal Medicine

## 2022-06-03 ENCOUNTER — Encounter: Payer: Self-pay | Admitting: Internal Medicine

## 2022-06-03 VITALS — BP 110/66 | HR 65 | Temp 97.4°F | Resp 16 | Ht 68.0 in | Wt 255.2 lb

## 2022-06-03 DIAGNOSIS — R7989 Other specified abnormal findings of blood chemistry: Secondary | ICD-10-CM

## 2022-06-03 DIAGNOSIS — Z0000 Encounter for general adult medical examination without abnormal findings: Secondary | ICD-10-CM

## 2022-06-03 DIAGNOSIS — E785 Hyperlipidemia, unspecified: Secondary | ICD-10-CM

## 2022-06-03 LAB — CBC WITH DIFFERENTIAL/PLATELET
Basophils Absolute: 0 10*3/uL (ref 0.0–0.1)
Basophils Relative: 0.6 % (ref 0.0–3.0)
Eosinophils Absolute: 0.3 10*3/uL (ref 0.0–0.7)
Eosinophils Relative: 3.7 % (ref 0.0–5.0)
HCT: 43 % (ref 39.0–52.0)
Hemoglobin: 14.8 g/dL (ref 13.0–17.0)
Lymphocytes Relative: 37.6 % (ref 12.0–46.0)
Lymphs Abs: 2.6 10*3/uL (ref 0.7–4.0)
MCHC: 34.4 g/dL (ref 30.0–36.0)
MCV: 85.2 fl (ref 78.0–100.0)
Monocytes Absolute: 0.5 10*3/uL (ref 0.1–1.0)
Monocytes Relative: 7.2 % (ref 3.0–12.0)
Neutro Abs: 3.5 10*3/uL (ref 1.4–7.7)
Neutrophils Relative %: 50.9 % (ref 43.0–77.0)
Platelets: 235 10*3/uL (ref 150.0–400.0)
RBC: 5.05 Mil/uL (ref 4.22–5.81)
RDW: 13.4 % (ref 11.5–15.5)
WBC: 6.9 10*3/uL (ref 4.0–10.5)

## 2022-06-03 LAB — HEPATIC FUNCTION PANEL
ALT: 35 U/L (ref 0–53)
AST: 25 U/L (ref 0–37)
Albumin: 4.3 g/dL (ref 3.5–5.2)
Alkaline Phosphatase: 79 U/L (ref 39–117)
Bilirubin, Direct: 0.1 mg/dL (ref 0.0–0.3)
Total Bilirubin: 0.6 mg/dL (ref 0.2–1.2)
Total Protein: 6.8 g/dL (ref 6.0–8.3)

## 2022-06-03 LAB — LDL CHOLESTEROL, DIRECT: Direct LDL: 69 mg/dL

## 2022-06-03 LAB — LIPID PANEL
Cholesterol: 173 mg/dL (ref 0–200)
HDL: 34.9 mg/dL — ABNORMAL LOW (ref >39.00)
NonHDL: 138.42
Total CHOL/HDL Ratio: 5
Triglycerides: 279 mg/dL — ABNORMAL HIGH (ref 0.0–149.0)
VLDL: 55.8 mg/dL — ABNORMAL HIGH (ref 0.0–40.0)

## 2022-06-03 LAB — TSH: TSH: 2.95 u[IU]/mL (ref 0.35–5.50)

## 2022-06-03 NOTE — Assessment & Plan Note (Signed)
-  Tdap 2014 -COVID booster recommended - flu shot: d/w pt  -CCS: Never had a cscope -Labs: LFTs, FLP, CBC, TSH -Diet and exercise: See comments under morbid obesity

## 2022-06-03 NOTE — Progress Notes (Signed)
Subjective:    Patient ID: Charles Diaz, male    DOB: 17-Aug-1980, 41 y.o.   MRN: [id number]  DOS:  06/03/2022 Type of visit - description: CPX  Here for CPX Patient's wife is concerned about episodic cough.  He denies GERD symptoms, postnasal dripping or wheezzing. His wife also reported heavy snoring the patient reports he is doing fine number of occasional fatigue does not have any major problems with lack of energy.   Review of Systems  Other than above, a 14 point review of systems is negative     Past Medical History:  Diagnosis Date   Dyslipidemia    Fatty liver    High cholesterol    High triglycerides    Lactose intolerance    Vitamin D deficiency     Past Surgical History:  Procedure Laterality Date   KNEE ARTHROSCOPY Right 2009   Social History   Socioeconomic History   Marital status: Married    Spouse name: sontehay   Number of children: 1   Years of education: Not on file   Highest education level: Not on file  Occupational History   Occupation: Advertising account planner, Technical brewer  Tobacco Use   Smoking status: Former    Types: Cigarettes    Quit date: 03/26/2018    Years since quitting: 4.1   Smokeless tobacco: Never   Tobacco comments:       Substance and Sexual Activity   Alcohol use: Yes    Comment: socially   Drug use: No   Sexual activity: Not on file  Other Topics Concern   Not on file  Social History Narrative   Married    Household: pt, wife , child (2017)   Parents from [place]   Social Determinants of Health   Financial Resource Strain: Not on file  Food Insecurity: Not on file  Transportation Needs: Not on file  Physical Activity: Not on file  Stress: Not on file  Social Connections: Not on file  Intimate Partner Violence: Not on file     Current Outpatient Medications  Medication Instructions   Cholecalciferol (VITAMIN D) 125 MCG (5000 UT) CAPS 1 capsule, Oral, Daily   fenofibrate 160 mg, Oral, Daily   Multiple Vitamin  (MULTI-VITAMIN DAILY PO) Oral   omega-3 acid ethyl esters (LOVAZA) 2 g, Oral, 2 times daily       Objective:   Physical Exam BP 110/66   Pulse 65   Temp (!) 97.4 F (36.3 C) (Oral)   Resp 16   Ht 5\' 8"  (1.727 m)   Wt 255 lb 4 oz (115.8 kg)   SpO2 97%   BMI 38.81 kg/m  General: Well developed, NAD, BMI noted Neck: No  thyromegaly  HEENT:  Normocephalic . Face symmetric, atraumatic Lungs:  CTA B Normal respiratory effort, no intercostal retractions, no accessory muscle use. Heart: RRR,  no murmur.  Abdomen:  Not distended, soft, non-tender. No rebound or rigidity.   Lower extremities: no pretibial edema bilaterally  Skin: Exposed areas without rash. Not pale. Not jaundice Neurologic:  alert & oriented X3.  Speech normal, gait appropriate for age and unassisted Strength symmetric and appropriate for age.  Psych: Cognition and judgment appear intact.  Cooperative with normal attention span and concentration.  Behavior appropriate. No anxious or depressed appearing.     Assessment     Assessment Dyslipidemia , high TG L arm skin lesion, s/p Bx per derm before, no dx made, as off 8-17 decreasing in size per  pt  Increase LFTs  -2018: Hep B and C serology (-)   - 2023: Ferritin, alpha-1 antitrypsin, hep B and C serology negative Moderate obesity  PLAN: Here for CPX Dyslipidemia, elevated TG: Last triglycerides check few months ago in the context of the patient just coming back from vacation were quite elevated. He is currently on fenofibrate and Lovaza.  Diet extensively discussed, check FLP. Morbid obesity: Not making much progress, patient is not particularly concerned about it, explained complications of obesity including DJD, DM, heart disease, OSA.  He might benefit from new medications for the treatment of obesity Increased LFTs: Probably fatty liver, for completeness we will check a [place] OSA?  + Snoring however Epworth scale score is 1 (negative).   Observation. Cough: Observation, see HPI RTC 1 year depending on results

## 2022-06-03 NOTE — Assessment & Plan Note (Signed)
Here for CPX Dyslipidemia, elevated TG: Last triglycerides check few months ago in the context of the patient just coming back from vacation were quite elevated. He is currently on fenofibrate and Lovaza.  Diet extensively discussed, check FLP. Morbid obesity: Not making much progress, patient is not particularly concerned about it, explained complications of obesity including DJD, DM, heart disease, OSA.  He might benefit from new medications for the treatment of obesity Increased LFTs: Probably fatty liver, for completeness we will check a [place] OSA?  + Snoring however Epworth scale score is 1 (negative).  Observation. Cough: Observation, see HPI RTC 1 year depending on results

## 2022-06-03 NOTE — Patient Instructions (Addendum)
Recommend to continue going to the wellness clinic. Consider medication to help you with your weight.    GO TO THE LAB : Get the blood work     GO TO THE FRONT DESK, PLEASE SCHEDULE YOUR APPOINTMENTS Come back for physical exam in 1 year, sooner depending on the results

## 2022-06-08 ENCOUNTER — Ambulatory Visit (HOSPITAL_BASED_OUTPATIENT_CLINIC_OR_DEPARTMENT_OTHER)
Admission: RE | Admit: 2022-06-08 | Discharge: 2022-06-08 | Disposition: A | Discharge status: Home / Self Care | Payer: BC Managed Care – PPO | Source: Ambulatory Visit | Attending: Internal Medicine | Admitting: Internal Medicine

## 2022-06-08 ENCOUNTER — Ambulatory Visit (INDEPENDENT_AMBULATORY_CARE_PROVIDER_SITE_OTHER): Payer: BC Managed Care – PPO | Admitting: Physician Assistant

## 2022-06-08 ENCOUNTER — Encounter (INDEPENDENT_AMBULATORY_CARE_PROVIDER_SITE_OTHER): Payer: Self-pay | Admitting: Physician Assistant

## 2022-06-08 VITALS — BP 112/79 | HR 54 | Temp 97.6°F | Ht 68.0 in | Wt 245.0 lb

## 2022-06-08 DIAGNOSIS — K760 Fatty (change of) liver, not elsewhere classified: Secondary | ICD-10-CM | POA: Diagnosis not present

## 2022-06-08 DIAGNOSIS — E782 Mixed hyperlipidemia: Secondary | ICD-10-CM

## 2022-06-08 DIAGNOSIS — E669 Obesity, unspecified: Secondary | ICD-10-CM

## 2022-06-08 DIAGNOSIS — R7989 Other specified abnormal findings of blood chemistry: Secondary | ICD-10-CM | POA: Diagnosis not present

## 2022-06-08 DIAGNOSIS — E88819 Insulin resistance, unspecified: Secondary | ICD-10-CM

## 2022-06-08 DIAGNOSIS — E559 Vitamin D deficiency, unspecified: Secondary | ICD-10-CM

## 2022-06-08 DIAGNOSIS — Z6837 Body mass index (BMI) 37.0-37.9, adult: Secondary | ICD-10-CM

## 2022-06-08 DIAGNOSIS — K8020 Calculus of gallbladder without cholecystitis without obstruction: Secondary | ICD-10-CM | POA: Diagnosis not present

## 2022-06-09 LAB — VITAMIN D 25 HYDROXY (VIT D DEFICIENCY, FRACTURES): Vit D, 25-Hydroxy: 33.2 ng/mL (ref 30.0–100.0)

## 2022-07-02 NOTE — Progress Notes (Signed)
Chief Complaint:   OBESITY Omarie is here to discuss his progress with his obesity treatment plan along with follow-up of his obesity related diagnoses. Findlay is on the Category 3 Plan and states he is following his eating plan approximately 50% of the time. Irfan states he is playing soccer 60+ minutes 1 times per week.  Today's visit was #: 6 Starting weight: 258 lbs Starting date: 12/23/2021 Today's weight: 245 lbs Today's date: 06/08/2022 Total lbs lost to date: 13 Total lbs lost since last in-office visit: 5  Interim History: Haston has done well with weight loss recently.  He struggles with lunch as he needs to "grab and go"/frequently eating out at lunch. Reports he tries to eat on plan at breakfast and dinner and does well with these most of the time.  Pt saw PCP, Dr. Kathlene November, last week and reports labs were rechecked.  Subjective:   1. Insulin resistance [place] has been working on healthy eating to decrease simple carbohydrates and saturated fat, and increasing lean proteins.  2. Elevated triglycerides with high cholesterol Discussed labs with patient today. He is taking fenofibrate 160 mg daily and Lovaza 1 gram daily with no side effects. He is unsure about family history. Volney has an LDL of 69 and stable, HDL increased to 34.40, and triglycerides decreased to 279 from 1610 on 06/03/2022. He is working on decreasing saturated fat and cholesterol in his diet and exercise to promote weight loss.  3. Elevated LFTs Pt's last liver panel was normal/without elevation of liver function on 06/03/2022. He is working on healthy eating to decrease simple carbs, decrease cholesterol and saturated fat, and increase lean proteins and exercise to promote weight loss.  4. Vitamin D deficiency Last Vitamin D level was 18.3 on 12/23/2021-not at goal. Pt is taking OTC Vitamin D 5,000 IU daily with no side effects.  Assessment/Plan:   1. Insulin resistance Continue prescribed nutrition plan  to decrease simple carbohydrates, increase lean proteins and exercise to promote weight loss.  Will recheck insulin levels and A1c in 3 to 4 months.  2. Elevated triglycerides with high cholesterol Continue healthy eating plan to decrease saturated fats and cholesterol, and exercise to promote weight loss.   Continue fenofibrate, and Lovaza as directed follow-up.  Recheck triglycerides and lipid panel in 3 to 4 months  3. Elevated LFTs Discussed previous elevation was likely secondary to fatty liver and discussed continue to work on healthy eating plan with decrease simple carbohydrates, decrease saturated fat and cholesterol and increase lean proteins and exercise to promote weight loss.  4. Vitamin D deficiency Continue OTC Vitamin D 5,000 IU daily.  Recheck vitamin D level today and will discuss any changes in medication depending on results.  Lab/Orders today: - VITAMIN D 25 Hydroxy (Vit-D Deficiency, Fractures)  5. Obesity, Current BMI 37.3 Gil is currently in the action stage of change. As such, his goal is to continue with weight loss efforts. He has agreed to the Category 3 Plan and keeping a food journal and adhering to recommended goals of 350-500 calories and 35+ grams protein with lunch.   Exercise goals:  As is  Behavioral modification strategies: increasing lean protein intake, decreasing simple carbohydrates, holiday eating strategies , and planning for success.  Sharrieff has agreed to follow-up with our clinic in 4 weeks. He was informed of the importance of frequent follow-up visits to maximize his success with intensive lifestyle modifications for his multiple health conditions.   Mathews was  informed we would discuss his lab results at his next visit unless there is a critical issue that needs to be addressed sooner. [place] agreed to keep his next visit at the agreed upon time to discuss these results.  Objective:   Blood pressure 112/79, pulse (!) 54, temperature 97.6 F (36.4  C), height 5\' 8"  (1.727 m), weight 245 lb (111.1 kg), SpO2 98 %. Body mass index is 37.25 kg/m.  General: Cooperative, alert, well developed, in no acute distress. HEENT: Conjunctivae and lids unremarkable. Cardiovascular: Regular rhythm.  Lungs: Normal work of breathing. Neurologic: No focal deficits.   Lab Results  Component Value Date   CREATININE 1.17 12/23/2021   BUN 13 12/23/2021   NA 137 12/23/2021   K 3.9 12/23/2021   CL 101 12/23/2021   CO2 18 (L) 12/23/2021   Lab Results  Component Value Date   ALT 35 06/03/2022   AST 25 06/03/2022   ALKPHOS 79 06/03/2022   BILITOT 0.6 06/03/2022   Lab Results  Component Value Date   HGBA1C 5.4 12/23/2021   HGBA1C 5.4 01/27/2020   HGBA1C 5.3 02/23/2018   Lab Results  Component Value Date   INSULIN 9.1 12/23/2021   INSULIN 15.5 07/16/2020   INSULIN 15.4 03/16/2020   INSULIN 14.4 01/27/2020   Lab Results  Component Value Date   TSH 2.95 06/03/2022   Lab Results  Component Value Date   CHOL 173 06/03/2022   HDL 34.90 (L) 06/03/2022   LDLCALC Comment (A) 12/23/2021   LDLDIRECT 69.0 06/03/2022   TRIG 279.0 (H) 06/03/2022   CHOLHDL 5 06/03/2022   Lab Results  Component Value Date   VD25OH 33.2 06/08/2022   VD25OH 18.3 (L) 12/23/2021   VD25OH 37.64 10/01/2021   Lab Results  Component Value Date   WBC 6.9 06/03/2022   HGB 14.8 06/03/2022   HCT 43.0 06/03/2022   MCV 85.2 06/03/2022   PLT 235.0 06/03/2022   Lab Results  Component Value Date   IRON 89 10/01/2021   TIBC 357.0 10/01/2021   FERRITIN 72.9 10/01/2021   Attestation Statements:   Reviewed by clinician on day of visit: allergies, medications, problem list, medical history, surgical history, family history, social history, and previous encounter notes.  I, Kathlene November, BS, CMA, am acting as transcriptionist for [employer] Bentlee Benningfield, PA-C.  I have reviewed the above documentation for accuracy and completeness, and I agree with the above. -   Stacee Earp,PA-C

## 2022-07-11 ENCOUNTER — Ambulatory Visit (INDEPENDENT_AMBULATORY_CARE_PROVIDER_SITE_OTHER): Payer: BC Managed Care – PPO | Admitting: Adult Health

## 2022-07-11 ENCOUNTER — Encounter (INDEPENDENT_AMBULATORY_CARE_PROVIDER_SITE_OTHER): Payer: Self-pay | Admitting: Adult Health

## 2022-07-11 VITALS — BP 112/78 | HR 65 | Temp 98.0°F | Ht 68.0 in | Wt 249.0 lb

## 2022-07-11 DIAGNOSIS — E669 Obesity, unspecified: Secondary | ICD-10-CM | POA: Diagnosis not present

## 2022-07-11 DIAGNOSIS — Z6837 Body mass index (BMI) 37.0-37.9, adult: Secondary | ICD-10-CM

## 2022-07-11 DIAGNOSIS — K760 Fatty (change of) liver, not elsewhere classified: Secondary | ICD-10-CM | POA: Diagnosis not present

## 2022-07-11 DIAGNOSIS — E782 Mixed hyperlipidemia: Secondary | ICD-10-CM

## 2022-07-25 NOTE — Progress Notes (Unsigned)
Chief Complaint:   OBESITY Charles Diaz is here to discuss his progress with his obesity treatment plan along with follow-up of his obesity related diagnoses. Aaran is on the Category 3 Plan and states he is following his eating plan approximately 85% of the time. Khalen states he is walking, running 5-30 minutes 3 times per week.  Today's visit was #: 7 Starting weight: 258 lbs Starting date: 12/23/2021 Today's weight: 249 lbs Today's date: 07/11/2022 Total lbs lost to date: 9 lbs Total lbs lost since last in-office visit: + 4 lbs  Interim History: ***  Subjective:   1. Elevated triglycerides with high cholesterol Lipid *** Currently on *** Patient denies RUQ pain.   2. Hepatic steatosis 06/08/2022, ultrasound abdomen completed **(*  Assessment/Plan:   1. Elevated triglycerides with high cholesterol Continue current medication regime.   2. Hepatic steatosis Continue with weight loss efforts.   3. Obesity, Current BMI 37.9 [name] is currently in the action stage of change. As such, his goal is to continue with weight loss efforts. He has agreed to the Category 3 Plan.   Exercise goals:  As is.   Behavioral modification strategies: increasing lean protein intake, decreasing simple carbohydrates, meal planning and cooking strategies, keeping healthy foods in the home, and planning for success.  Shahan has agreed to follow-up with our clinic in 3 weeks. He was informed of the importance of frequent follow-up visits to maximize his success with intensive lifestyle modifications for his multiple health conditions.   Objective:   Blood pressure 112/78, pulse 65, temperature 98 F (36.7 C), height 5\' 8"  (1.727 m), weight 249 lb (112.9 kg), SpO2 99 %. Body mass index is 37.86 kg/m.  General: Cooperative, alert, well developed, in no acute distress. HEENT: Conjunctivae and lids unremarkable. Cardiovascular: Regular rhythm.  Lungs: Normal work of breathing. Neurologic: No focal  deficits.   Lab Results  Component Value Date   CREATININE 1.17 12/23/2021   BUN 13 12/23/2021   NA 137 12/23/2021   K 3.9 12/23/2021   CL 101 12/23/2021   CO2 18 (L) 12/23/2021   Lab Results  Component Value Date   ALT 35 06/03/2022   AST 25 06/03/2022   ALKPHOS 79 06/03/2022   BILITOT 0.6 06/03/2022   Lab Results  Component Value Date   HGBA1C 5.4 12/23/2021   HGBA1C 5.4 01/27/2020   HGBA1C 5.3 02/23/2018   Lab Results  Component Value Date   INSULIN 9.1 12/23/2021   INSULIN 15.5 07/16/2020   INSULIN 15.4 03/16/2020   INSULIN 14.4 01/27/2020   Lab Results  Component Value Date   TSH 2.95 06/03/2022   Lab Results  Component Value Date   CHOL 173 06/03/2022   HDL 34.90 (L) 06/03/2022   LDLCALC Comment (A) 12/23/2021   LDLDIRECT 69.0 06/03/2022   TRIG 279.0 (H) 06/03/2022   CHOLHDL 5 06/03/2022   Lab Results  Component Value Date   VD25OH 33.2 06/08/2022   VD25OH 18.3 (L) 12/23/2021   VD25OH 37.64 10/01/2021   Lab Results  Component Value Date   WBC 6.9 06/03/2022   HGB 14.8 06/03/2022   HCT 43.0 06/03/2022   MCV 85.2 06/03/2022   PLT 235.0 06/03/2022   Lab Results  Component Value Date   IRON 89 10/01/2021   TIBC 357.0 10/01/2021   FERRITIN 72.9 10/01/2021   Attestation Statements:   Reviewed by clinician on day of visit: allergies, medications, problem list, medical history, surgical history, family history, social history, and previous  encounter notes.  I, Davy Pique, RMA, am acting as Location manager for Mina Marble, NP.  I have reviewed the above documentation for accuracy and completeness, and I agree with the above. -  ***

## 2022-08-02 ENCOUNTER — Encounter (INDEPENDENT_AMBULATORY_CARE_PROVIDER_SITE_OTHER): Payer: Self-pay | Admitting: Adult Health

## 2022-08-02 ENCOUNTER — Ambulatory Visit (INDEPENDENT_AMBULATORY_CARE_PROVIDER_SITE_OTHER): Payer: BC Managed Care – PPO | Admitting: Adult Health

## 2022-08-02 VITALS — BP 126/77 | HR 65 | Temp 98.0°F | Ht 68.0 in | Wt 253.0 lb

## 2022-08-02 DIAGNOSIS — E782 Mixed hyperlipidemia: Secondary | ICD-10-CM | POA: Diagnosis not present

## 2022-08-02 DIAGNOSIS — E88819 Insulin resistance, unspecified: Secondary | ICD-10-CM | POA: Diagnosis not present

## 2022-08-02 DIAGNOSIS — E669 Obesity, unspecified: Secondary | ICD-10-CM

## 2022-08-02 DIAGNOSIS — Z6838 Body mass index (BMI) 38.0-38.9, adult: Secondary | ICD-10-CM | POA: Diagnosis not present

## 2022-08-02 MED ORDER — ZEPBOUND 2.5 MG/0.5ML ~~LOC~~ SOAJ: 2.5000 mg | SUBCUTANEOUS | 0 refills | Status: DC

## 2022-08-02 NOTE — Progress Notes (Addendum)
Chief Complaint:   OBESITY Charles Diaz is here to discuss his progress with his obesity treatment plan along with follow-up of his obesity related diagnoses. Charles Diaz is on the Category 3 Plan and states he is following his eating plan approximately 80% of the time. Charles Diaz states he is weight and cardiovascular exercise 30 minutes 4 times per week.  Today's visit was #: 8 Starting weight: 258 lbs Starting date: 01/27/2020 RESTART- 12/23/2021 Today's weight: 253 lbs Today's date: 08/02/2022 Total lbs lost to date: 5 lbs Total lbs lost since last in-office visit: + 4 lbs  Interim History:  Initial Start with HWW 01/27/2020 Restarted HWW 12/23/2021  He continues to follow Cat 3 meal with at least 80% compliance and exercises at least 4 times per week- alternating between weight lifting and cardiovascular exercise.  He is frustrated with weight loss plateau.   Subjective:   1. Insulin resistance Lab Results  Component Value Date   HGBA1C 5.4 12/23/2021   HGBA1C 5.4 01/27/2020   HGBA1C 5.3 02/23/2018   Despite regular exercise and limiting sugar/CHO- insulin level fluctuates between 9.1-15.5 He denies family hx of MEN 2 or MTC.  He denies personal hx of pancreatitis.     2. Elevated triglycerides with high cholesterol Lipid Panel     Component Value Date/Time   CHOL 173 06/03/2022 0924   CHOL 326 (H) 12/23/2021 0836   TRIG 279.0 (H) 06/03/2022 0924   HDL 34.90 (L) 06/03/2022 0924   HDL 23 (L) 12/23/2021 0836   CHOLHDL 5 06/03/2022 0924   VLDL 55.8 (H) 06/03/2022 0924   LDLCALC Comment (A) 12/23/2021 0836   LDLDIRECT 69.0 06/03/2022 0924   LABVLDL Comment (A) 12/23/2021 0836   Relevant Medications  fenofibrate 160 MG tablet  omega-3 acid ethyl esters (LOVAZA) 1 g capsule   He denies RUQ pain  He denies frequent ETOH use He is currently on Lovaza 1mg  2 caps BID He denies CP when he exercises at high intensity   Assessment/Plan:   1. Insulin resistance Start GLP-1/GIP  therapy.  2. Elevated triglycerides with high cholesterol Continue  fenofibrate 160 MG tablet  omega-3 acid ethyl esters (LOVAZA) 1 g capsule    3. Obesity, Current BMI 38.6 Start Zepbound 2.5mg  once weekly injection Disp 2ml RF 0  Charles Diaz is currently in the action stage of change. As such, his goal is to continue with weight loss efforts. He has agreed to the Category 3 Plan.   Exercise goals: For substantial health benefits, adults should do at least 150 minutes (2 hours and 30 minutes) a week of moderate-intensity, or 75 minutes (1 hour and 15 minutes) a week of vigorous-intensity aerobic physical activity, or an equivalent combination of moderate- and vigorous-intensity aerobic activity. Aerobic activity should be performed in episodes of at least 10 minutes, and preferably, it should be spread throughout the week.  Behavioral modification strategies: increasing lean protein intake, decreasing simple carbohydrates, increasing vegetables, increasing water intake, decreasing sodium intake, no skipping meals, meal planning and cooking strategies, and planning for success.  Charles Diaz has agreed to follow-up with our clinic in 2 weeks. He was informed of the importance of frequent follow-up visits to maximize his success with intensive lifestyle modifications for his multiple health conditions.   Objective:   Blood pressure 126/77, pulse 65, temperature 98 F (36.7 C), height 5\' 8"  (1.727 m), weight 253 lb (114.8 kg), SpO2 97 %. Body mass index is 38.47 kg/m.  General: Cooperative, alert, well developed, in no  acute distress. HEENT: Conjunctivae and lids unremarkable. Cardiovascular: Regular rhythm.  Lungs: Normal work of breathing. Neurologic: No focal deficits.   Lab Results  Component Value Date   CREATININE 1.17 12/23/2021   BUN 13 12/23/2021   NA 137 12/23/2021   K 3.9 12/23/2021   CL 101 12/23/2021   CO2 18 (L) 12/23/2021   Lab Results  Component Value Date   ALT 35  06/03/2022   AST 25 06/03/2022   ALKPHOS 79 06/03/2022   BILITOT 0.6 06/03/2022   Lab Results  Component Value Date   HGBA1C 5.4 12/23/2021   HGBA1C 5.4 01/27/2020   HGBA1C 5.3 02/23/2018   Lab Results  Component Value Date   INSULIN 9.1 12/23/2021   INSULIN 15.5 07/16/2020   INSULIN 15.4 03/16/2020   INSULIN 14.4 01/27/2020   Lab Results  Component Value Date   TSH 2.95 06/03/2022   Lab Results  Component Value Date   CHOL 173 06/03/2022   HDL 34.90 (L) 06/03/2022   LDLCALC Comment (A) 12/23/2021   LDLDIRECT 69.0 06/03/2022   TRIG 279.0 (H) 06/03/2022   CHOLHDL 5 06/03/2022   Lab Results  Component Value Date   VD25OH 33.2 06/08/2022   VD25OH 18.3 (L) 12/23/2021   VD25OH 37.64 10/01/2021   Lab Results  Component Value Date   WBC 6.9 06/03/2022   HGB 14.8 06/03/2022   HCT 43.0 06/03/2022   MCV 85.2 06/03/2022   PLT 235.0 06/03/2022   Lab Results  Component Value Date   IRON 89 10/01/2021   TIBC 357.0 10/01/2021   FERRITIN 72.9 10/01/2021    Attestation Statements:   Reviewed by clinician on day of visit: allergies, medications, problem list, medical history, surgical history, family history, social history, and previous encounter notes.   I have reviewed the above documentation for accuracy and completeness, and I agree with the above. -  Tavi Hoogendoorn d. Aditya Nastasi, NP-C

## 2022-08-03 ENCOUNTER — Encounter (INDEPENDENT_AMBULATORY_CARE_PROVIDER_SITE_OTHER): Payer: Self-pay | Admitting: Adult Health

## 2022-08-04 ENCOUNTER — Telehealth (INDEPENDENT_AMBULATORY_CARE_PROVIDER_SITE_OTHER): Payer: Self-pay

## 2022-08-04 NOTE — Telephone Encounter (Signed)
Pa submitted for Zepbound, 08/04/22 and approved until 04/01/23

## 2022-08-29 ENCOUNTER — Ambulatory Visit (INDEPENDENT_AMBULATORY_CARE_PROVIDER_SITE_OTHER): Payer: BC Managed Care – PPO | Admitting: Adult Health

## 2022-08-29 ENCOUNTER — Other Ambulatory Visit (INDEPENDENT_AMBULATORY_CARE_PROVIDER_SITE_OTHER): Payer: Self-pay | Admitting: Adult Health

## 2022-08-29 ENCOUNTER — Encounter (INDEPENDENT_AMBULATORY_CARE_PROVIDER_SITE_OTHER): Payer: Self-pay | Admitting: Adult Health

## 2022-08-29 VITALS — BP 108/75 | HR 67 | Temp 98.1°F | Ht 68.0 in | Wt 246.0 lb

## 2022-08-29 DIAGNOSIS — E782 Mixed hyperlipidemia: Secondary | ICD-10-CM

## 2022-08-29 DIAGNOSIS — E669 Obesity, unspecified: Secondary | ICD-10-CM

## 2022-08-29 DIAGNOSIS — Z6837 Body mass index (BMI) 37.0-37.9, adult: Secondary | ICD-10-CM

## 2022-08-29 MED ORDER — ZEPBOUND 5 MG/0.5ML ~~LOC~~ SOAJ: 5.0000 mg | SUBCUTANEOUS | 0 refills | Status: DC

## 2022-08-29 MED ORDER — OMEGA-3-ACID ETHYL ESTERS 1 G PO CAPS: 2.0000 g | ORAL_CAPSULE | Freq: Two times a day (BID) | ORAL | 0 refills | Status: DC

## 2022-08-29 NOTE — Progress Notes (Addendum)
WEIGHT SUMMARY AND BIOMETRICS  Vitals Temp: 98.1 F (36.7 C) BP: 108/75 Pulse Rate: 67 SpO2: 99 %   Anthropometric Measurements Height: 5\' 8"  (1.727 m) Weight: 246 lb (111.6 kg) BMI (Calculated): 37.41 Weight at Last Visit: 253lb Weight Lost Since Last Visit: 7lb Starting Weight: 258lb Total Weight Loss (lbs): 12 lb (5.443 kg)   Body Composition  Body Fat %: 31.1 % Fat Mass (lbs): 76.6 lbs Muscle Mass (lbs): 161 lbs Total Body Water (lbs): 114.4 lbs Visceral Fat Rating : 16   Other Clinical Data Fasting: no Labs: no Today's Visit #: 9 Starting Date: 12/23/21    Chief Complaint:   OBESITY Charles Diaz is here to discuss his progress with his obesity treatment plan. He is on the the Category 3 Plan and states he is following his eating plan approximately 80 % of the time.  He states he is exercising cardio/weight 45 minutes 3 times per week.   Interim History:  He was started on Zepbound last OV- has had 3 injections of Zepbound 2.5mg - tolerating well. Denies mass in neck, dysphagia, dyspepsia, persistent hoarseness, abdominal pain, or N/V/C   He and his family will travel to [place] and [place] 14-17 September 2022 Discussed storage requirements for Zepbound therapy.  Subjective:   1. Elevated triglycerides with high cholesterol Lipid Panel     Component Value Date/Time   CHOL 173 06/03/2022 0924   CHOL 326 (H) 12/23/2021 0836   TRIG 279.0 (H) 06/03/2022 0924   HDL 34.90 (L) 06/03/2022 0924   HDL 23 (L) 12/23/2021 0836   CHOLHDL 5 06/03/2022 0924   VLDL 55.8 (H) 06/03/2022 0924   LDLCALC Comment (A) 12/23/2021 0836   LDLDIRECT 69.0 06/03/2022 0924   LABVLDL Comment (A) 12/23/2021 0836   He is on Lovaza 1g - 2 caps BID He denies tobacco/vape use. He is planning on continuing HITT training series!  Assessment/Plan:   1. Elevated triglycerides with high cholesterol Refill - omega-3 acid ethyl esters (LOVAZA) 1 g capsule; Take 2 capsules (2 g total) by  mouth 2 (two) times daily.  Dispense: 120 capsule; Refill: 0  2. Obesity, Current BMI 37.41 Refill and increase Zepbound 5mg  once week Disp 3 ml RF 0  He will inject last dose of Zepbound 2.5mg  this Wednesday 08/31/2022 He will increase to Zepbound 5mg  on Wednesday 09/07/2022  [name] is currently in the action stage of change. As such, his goal is to continue with weight loss efforts. He has agreed to the Category 3 Plan.   Exercise goals: For substantial health benefits, adults should do at least 150 minutes (2 hours and 30 minutes) a week of moderate-intensity, or 75 minutes (1 hour and 15 minutes) a week of vigorous-intensity aerobic physical activity, or an equivalent combination of moderate- and vigorous-intensity aerobic activity. Aerobic activity should be performed in episodes of at least 10 minutes, and preferably, it should be spread throughout the week.  Behavioral modification strategies: increasing lean protein intake, decreasing simple carbohydrates, increasing vegetables, increasing water intake, meal planning and cooking strategies, keeping healthy foods in the home, travel eating strategies, and planning for success.  Toussaint has agreed to follow-up with our clinic in 4 weeks. He was informed of the importance of frequent follow-up visits to maximize his success with intensive lifestyle modifications for his multiple health conditions.   Objective:   Blood pressure 108/75, pulse 67, temperature 98.1 F (36.7 C), height 5\' 8"  (1.727 m), weight 246 lb (111.6 kg), SpO2 99 %.  Body mass index is 37.4 kg/m.  General: Cooperative, alert, well developed, in no acute distress. HEENT: Conjunctivae and lids unremarkable. Cardiovascular: Regular rhythm.  Lungs: Normal work of breathing. Neurologic: No focal deficits.   Lab Results  Component Value Date   CREATININE 1.17 12/23/2021   BUN 13 12/23/2021   NA 137 12/23/2021   K 3.9 12/23/2021   CL 101 12/23/2021   CO2 18 (L) 12/23/2021    Lab Results  Component Value Date   ALT 35 06/03/2022   AST 25 06/03/2022   ALKPHOS 79 06/03/2022   BILITOT 0.6 06/03/2022   Lab Results  Component Value Date   HGBA1C 5.4 12/23/2021   HGBA1C 5.4 01/27/2020   HGBA1C 5.3 02/23/2018   Lab Results  Component Value Date   INSULIN 9.1 12/23/2021   INSULIN 15.5 07/16/2020   INSULIN 15.4 03/16/2020   INSULIN 14.4 01/27/2020   Lab Results  Component Value Date   TSH 2.95 06/03/2022   Lab Results  Component Value Date   CHOL 173 06/03/2022   HDL 34.90 (L) 06/03/2022   LDLCALC Comment (A) 12/23/2021   LDLDIRECT 69.0 06/03/2022   TRIG 279.0 (H) 06/03/2022   CHOLHDL 5 06/03/2022   Lab Results  Component Value Date   VD25OH 33.2 06/08/2022   VD25OH 18.3 (L) 12/23/2021   VD25OH 37.64 10/01/2021   Lab Results  Component Value Date   WBC 6.9 06/03/2022   HGB 14.8 06/03/2022   HCT 43.0 06/03/2022   MCV 85.2 06/03/2022   PLT 235.0 06/03/2022   Lab Results  Component Value Date   IRON 89 10/01/2021   TIBC 357.0 10/01/2021   FERRITIN 72.9 10/01/2021   Attestation Statements:   Reviewed by clinician on day of visit: allergies, medications, problem list, medical history, surgical history, family history, social history, and previous encounter notes.  I have reviewed the above documentation for accuracy and completeness, and I agree with the above. -  Keontay Vora d. Rudolfo Brandow, NP-C

## 2022-09-22 ENCOUNTER — Other Ambulatory Visit (INDEPENDENT_AMBULATORY_CARE_PROVIDER_SITE_OTHER): Payer: Self-pay | Admitting: Adult Health

## 2022-09-27 ENCOUNTER — Ambulatory Visit (INDEPENDENT_AMBULATORY_CARE_PROVIDER_SITE_OTHER): Payer: BC Managed Care – PPO | Admitting: Adult Health

## 2022-09-27 ENCOUNTER — Encounter (INDEPENDENT_AMBULATORY_CARE_PROVIDER_SITE_OTHER): Payer: Self-pay | Admitting: Adult Health

## 2022-09-27 VITALS — BP 104/72 | HR 69 | Temp 98.2°F | Ht 68.0 in | Wt 241.0 lb

## 2022-09-27 DIAGNOSIS — E88819 Insulin resistance, unspecified: Secondary | ICD-10-CM

## 2022-09-27 DIAGNOSIS — E669 Obesity, unspecified: Secondary | ICD-10-CM | POA: Diagnosis not present

## 2022-09-27 DIAGNOSIS — Z6836 Body mass index (BMI) 36.0-36.9, adult: Secondary | ICD-10-CM

## 2022-09-27 DIAGNOSIS — E6609 Other obesity due to excess calories: Secondary | ICD-10-CM

## 2022-09-27 DIAGNOSIS — K760 Fatty (change of) liver, not elsewhere classified: Secondary | ICD-10-CM

## 2022-09-27 MED ORDER — ZEPBOUND 5 MG/0.5ML ~~LOC~~ SOAJ: 5.0000 mg | SUBCUTANEOUS | 0 refills | Status: DC

## 2022-09-27 NOTE — Progress Notes (Signed)
WEIGHT SUMMARY AND BIOMETRICS  Vitals Temp: 98.2 F (36.8 C) BP: 104/72 Pulse Rate: 69 SpO2: 98 %   Anthropometric Measurements Height: 5\' 8"  (1.727 m) Weight: 241 lb (109.3 kg) BMI (Calculated): 36.65 Weight at Last Visit: 246lb Weight Lost Since Last Visit: 5lb Weight Gained Since Last Visit: 0 Starting Weight: 258lb Total Weight Loss (lbs): 17 lb (7.711 kg)   Body Composition  Body Fat %: 31 % Fat Mass (lbs): 74.8 lbs Muscle Mass (lbs): 158 lbs Total Body Water (lbs): 114 lbs Visceral Fat Rating : 16   Other Clinical Data Fasting: No Labs: No Today's Visit #: 10 Starting Date: 12/23/21    Chief Complaint:   OBESITY Charles Diaz is here to discuss his progress with his obesity treatment plan. He is on the the Category 3 Plan and states he is following his eating plan approximately 85 % of the time.  He states he is exercising Cardio/Weights 45 minutes 3 times per week.   Interim History:  He and his family travelled to [place] for 2 weeks. He was quite active with walking while in [place] - tracked steps: 8K-12K/day.  He has resumed weekly Wednesday workouts at [facility]- warmup/HITT/cooldown  He will also walk on home treadmill and use free weights.  He was started on Zepbound 08/02/2022- titrated up 5mg - has had 3 doses Denies mass in neck, dysphagia, dyspepsia, persistent hoarseness, abdominal pain, or N/V. He will experience mild constipation.  Subjective:   1. Insulin resistance  Latest Reference Range & Units 01/27/20 10:47 03/16/20 08:26 07/16/20 09:52 12/23/21 08:36  INSULIN 2.6 - 24.9 uIU/mL 14.4 15.4 15.5 9.1  He denies polyphagia He is currently on Zepbound for Obesity tx  2. Hepatic steatosis [place] ABD Complete 06/08/2022 IMPRESSION: 1. Cholelithiasis without sonographic evidence of acute cholecystitis. 2. Fatty infiltration of liver.   He denies RUQ pain  Assessment/Plan:   1. Insulin resistance Continue Cat 3 Meal Plan and  regular exercise  2. Hepatic steatosis Avoid Hepatoxic substances  Continue with weight loss efforts.  3. Obesity, Current BMI 36.65 Refill Zepbound 5mg  once week Disp 75ml RF 0  Paeton is currently in the action stage of change. As such, his goal is to continue with weight loss efforts. He has agreed to the Category 3 Plan.   Exercise goals: For substantial health benefits, adults should do at least 150 minutes (2 hours and 30 minutes) a week of moderate-intensity, or 75 minutes (1 hour and 15 minutes) a week of vigorous-intensity aerobic physical activity, or an equivalent combination of moderate- and vigorous-intensity aerobic activity. Aerobic activity should be performed in episodes of at least 10 minutes, and preferably, it should be spread throughout the week.  Behavioral modification strategies: increasing lean protein intake, decreasing simple carbohydrates, increasing vegetables, increasing water intake, no skipping meals, meal planning and cooking strategies, keeping healthy foods in the home, and planning for success.  [name] has agreed to follow-up with our clinic in 4 weeks. He was informed of the importance of frequent follow-up visits to maximize his success with intensive lifestyle modifications for his multiple health conditions.   Check Fasting Labs Early June 2024  Objective:   Blood pressure 104/72, pulse 69, temperature 98.2 F (36.8 C), height 5\' 8"  (1.727 m), weight 241 lb (109.3 kg), SpO2 98 %. Body mass index is 36.64 kg/m.  General: Cooperative, alert, well developed, in no acute distress. HEENT: Conjunctivae and lids unremarkable. Cardiovascular: Regular rhythm.  Lungs: Normal work of breathing. Neurologic: No  focal deficits.   Lab Results  Component Value Date   CREATININE 1.17 12/23/2021   BUN 13 12/23/2021   NA 137 12/23/2021   K 3.9 12/23/2021   CL 101 12/23/2021   CO2 18 (L) 12/23/2021   Lab Results  Component Value Date   ALT 35 06/03/2022    AST 25 06/03/2022   ALKPHOS 79 06/03/2022   BILITOT 0.6 06/03/2022   Lab Results  Component Value Date   HGBA1C 5.4 12/23/2021   HGBA1C 5.4 01/27/2020   HGBA1C 5.3 02/23/2018   Lab Results  Component Value Date   INSULIN 9.1 12/23/2021   INSULIN 15.5 07/16/2020   INSULIN 15.4 03/16/2020   INSULIN 14.4 01/27/2020   Lab Results  Component Value Date   TSH 2.95 06/03/2022   Lab Results  Component Value Date   CHOL 173 06/03/2022   HDL 34.90 (L) 06/03/2022   LDLCALC Comment (A) 12/23/2021   LDLDIRECT 69.0 06/03/2022   TRIG 279.0 (H) 06/03/2022   CHOLHDL 5 06/03/2022   Lab Results  Component Value Date   VD25OH 33.2 06/08/2022   VD25OH 18.3 (L) 12/23/2021   VD25OH 37.64 10/01/2021   Lab Results  Component Value Date   WBC 6.9 06/03/2022   HGB 14.8 06/03/2022   HCT 43.0 06/03/2022   MCV 85.2 06/03/2022   PLT 235.0 06/03/2022   Lab Results  Component Value Date   IRON 89 10/01/2021   TIBC 357.0 10/01/2021   FERRITIN 72.9 10/01/2021   Attestation Statements:   Reviewed by clinician on day of visit: allergies, medications, problem list, medical history, surgical history, family history, social history, and previous encounter notes.  I have reviewed the above documentation for accuracy and completeness, and I agree with the above. -  Daesean Lazarz d. Roseanna Koplin, NP-C

## 2022-10-07 ENCOUNTER — Encounter (INDEPENDENT_AMBULATORY_CARE_PROVIDER_SITE_OTHER): Payer: Self-pay | Admitting: Adult Health

## 2022-10-17 ENCOUNTER — Other Ambulatory Visit: Payer: Self-pay | Admitting: Internal Medicine

## 2022-10-31 ENCOUNTER — Encounter (INDEPENDENT_AMBULATORY_CARE_PROVIDER_SITE_OTHER): Payer: Self-pay | Admitting: Adult Health

## 2022-10-31 ENCOUNTER — Other Ambulatory Visit (HOSPITAL_COMMUNITY): Payer: Self-pay

## 2022-10-31 ENCOUNTER — Ambulatory Visit (INDEPENDENT_AMBULATORY_CARE_PROVIDER_SITE_OTHER): Payer: BC Managed Care – PPO | Admitting: Adult Health

## 2022-10-31 VITALS — BP 112/74 | HR 65 | Temp 98.2°F | Ht 68.0 in | Wt 246.0 lb

## 2022-10-31 DIAGNOSIS — E88819 Insulin resistance, unspecified: Secondary | ICD-10-CM | POA: Diagnosis not present

## 2022-10-31 DIAGNOSIS — E669 Obesity, unspecified: Secondary | ICD-10-CM | POA: Diagnosis not present

## 2022-10-31 DIAGNOSIS — Z6837 Body mass index (BMI) 37.0-37.9, adult: Secondary | ICD-10-CM | POA: Diagnosis not present

## 2022-10-31 MED ORDER — ZEPBOUND 2.5 MG/0.5ML ~~LOC~~ SOAJ: 2.5000 mg | SUBCUTANEOUS | 0 refills | Status: DC

## 2022-10-31 MED ORDER — ZEPBOUND 2.5 MG/0.5ML ~~LOC~~ SOAJ
2.5000 mg | SUBCUTANEOUS | 0 refills | Status: DC
  Filled 2022-10-31 – 2022-11-02 (×2): qty 2, 28d supply, fill #0

## 2022-10-31 NOTE — Progress Notes (Signed)
WEIGHT SUMMARY AND BIOMETRICS  Vitals Temp: 98.2 F (36.8 C) BP: 112/74 Pulse Rate: 65 SpO2: 100 %   Anthropometric Measurements Height: 5\' 8"  (1.727 m) Weight: 246 lb (111.6 kg) BMI (Calculated): 37.41 Weight at Last Visit: 241lb Weight Lost Since Last Visit: 0 Weight Gained Since Last Visit: 5lb Starting Weight: 258lb Total Weight Loss (lbs): 12 lb (5.443 kg)   Body Composition  Body Fat %: 31.4 % Fat Mass (lbs): 77.4 lbs Muscle Mass (lbs): 160.6 lbs Total Body Water (lbs): 115.6 lbs Visceral Fat Rating : 16   Other Clinical Data Fasting: yes Labs: no Today's Visit #: 11 Starting Date: 12/23/21    Chief Complaint:   OBESITY Charles Diaz is here to discuss his progress with his obesity treatment plan. He is on the the Category 3 Plan and states he is following his eating plan approximately 50 % of the time. He states he is exercising Soccer 45 minutes 2 times per week.   Interim History:  Charles Diaz recently travelled to [place] for his sisters wedding and then onto [place] for a work trip. He estimates to have walked between 6-10K steps per day while travelling. Due to [employer] he was unable to refill his last Zepbound 5mg  Rx- has been on GIP/GLP- therapy since on/about 09/27/2022.  Reviewed [employer] results with pt: Muscle Mass: + 2.6 lbs Adipose Mass: + 2.6 lbs  Hunger/appetite-He reports stable appetite levels. Exercise-Due to recent international and state-side travel he has not participated in [place] sanctioned HITT program.  He has been playing soccer 45 mins bi-weekly.   Subjective:   1. Insulin resistance He estimates to have walked between 6-10K steps per day while travelling. Due to [employer] he was unable to refill his last Zepbound 5mg  Rx- has been on GIP/GLP- therapy since on/about 09/27/2022. Lab Results  Component Value Date   HGBA1C 5.4 12/23/2021   HGBA1C 5.4 01/27/2020   HGBA1C 5.3 02/23/2018     Latest  Reference Range & Units 01/27/20 10:47 03/16/20 08:26 07/16/20 09:52 12/23/21 08:36  INSULIN 2.6 - 24.9 uIU/mL 14.4 15.4 15.5 9.1    Latest Reference Range & Units 05/26/21 11:19 10/01/21 10:39 12/23/21 08:36 06/03/22 09:24  Alkaline Phosphatase 39 - 117 U/L 93 87 110 79   Assessment/Plan:   1. Insulin resistance Increase protein and HITT exercise  3. Obesity, Current BMI 37.41 Re-start Zepbound 2.5mg  once weekly injection Disp 2ml RF 0 If tolerating well with re-start- send MyChart message at end of May and I will send in increase dose at Mx of 5mg   Charles Diaz is currently in the action stage of change. As such, his goal is to continue with weight loss efforts. He has agreed to the Category 3 Plan.   Exercise goals: For substantial health benefits, adults should do at least 150 minutes (2 hours and 30 minutes) a week of moderate-intensity, or 75 minutes (1 hour and 15 minutes) a week of vigorous-intensity aerobic physical activity, or an equivalent combination of moderate- and vigorous-intensity aerobic activity. Aerobic activity should be performed in episodes of at least 10 minutes, and preferably, it should be spread throughout the week.  Behavioral modification strategies: increasing lean protein intake, decreasing simple carbohydrates, increasing vegetables, increasing water intake, meal planning and cooking strategies, and planning for success.  Charles Diaz has agreed to follow-up with our clinic in 4 weeks. He was informed of the importance of frequent follow-up visits to maximize his success with intensive lifestyle modifications for his  multiple health conditions.   Objective:   Blood pressure 112/74, pulse 65, temperature 98.2 F (36.8 C), height 5\' 8"  (1.727 m), weight 246 lb (111.6 kg), SpO2 100 %. Body mass index is 37.4 kg/m.  General: Cooperative, alert, well developed, in no acute distress. HEENT: Conjunctivae and lids unremarkable. Cardiovascular: Regular rhythm.  Lungs: Normal  work of breathing. Neurologic: No focal deficits.   Lab Results  Component Value Date   CREATININE 1.17 12/23/2021   BUN 13 12/23/2021   NA 137 12/23/2021   K 3.9 12/23/2021   CL 101 12/23/2021   CO2 18 (L) 12/23/2021   Lab Results  Component Value Date   ALT 35 06/03/2022   AST 25 06/03/2022   ALKPHOS 79 06/03/2022   BILITOT 0.6 06/03/2022   Lab Results  Component Value Date   HGBA1C 5.4 12/23/2021   HGBA1C 5.4 01/27/2020   HGBA1C 5.3 02/23/2018   Lab Results  Component Value Date   INSULIN 9.1 12/23/2021   INSULIN 15.5 07/16/2020   INSULIN 15.4 03/16/2020   INSULIN 14.4 01/27/2020   Lab Results  Component Value Date   TSH 2.95 06/03/2022   Lab Results  Component Value Date   CHOL 173 06/03/2022   HDL 34.90 (L) 06/03/2022   LDLCALC Comment (A) 12/23/2021   LDLDIRECT 69.0 06/03/2022   TRIG 279.0 (H) 06/03/2022   CHOLHDL 5 06/03/2022   Lab Results  Component Value Date   VD25OH 33.2 06/08/2022   VD25OH 18.3 (L) 12/23/2021   VD25OH 37.64 10/01/2021   Lab Results  Component Value Date   WBC 6.9 06/03/2022   HGB 14.8 06/03/2022   HCT 43.0 06/03/2022   MCV 85.2 06/03/2022   PLT 235.0 06/03/2022   Lab Results  Component Value Date   IRON 89 10/01/2021   TIBC 357.0 10/01/2021   FERRITIN 72.9 10/01/2021   Attestation Statements:   Reviewed by clinician on day of visit: allergies, medications, problem list, medical history, surgical history, family history, social history, and previous encounter notes.  I have reviewed the above documentation for accuracy and completeness, and I agree with the above. -  Syesha Thaw d. Faust Thorington, NP-C

## 2022-11-01 ENCOUNTER — Telehealth: Payer: Self-pay

## 2022-11-01 NOTE — Telephone Encounter (Signed)
Per Cover My Meds: Clinical override not needed.

## 2022-11-01 NOTE — Telephone Encounter (Signed)
PA submitted through Cover My Meds for Zepbound.  Awaiting insurance determination. Key: [id number]

## 2022-11-02 ENCOUNTER — Encounter (INDEPENDENT_AMBULATORY_CARE_PROVIDER_SITE_OTHER): Payer: Self-pay | Admitting: Adult Health

## 2022-11-02 ENCOUNTER — Other Ambulatory Visit (INDEPENDENT_AMBULATORY_CARE_PROVIDER_SITE_OTHER): Payer: Self-pay | Admitting: Adult Health

## 2022-11-02 ENCOUNTER — Other Ambulatory Visit (HOSPITAL_COMMUNITY): Payer: Self-pay

## 2022-11-02 MED ORDER — ZEPBOUND 5 MG/0.5ML ~~LOC~~ SOAJ
5.0000 mg | SUBCUTANEOUS | 0 refills | Status: DC
  Filled 2022-11-02: qty 2, 28d supply, fill #0

## 2022-11-28 ENCOUNTER — Other Ambulatory Visit (INDEPENDENT_AMBULATORY_CARE_PROVIDER_SITE_OTHER): Payer: Self-pay | Admitting: Adult Health

## 2022-11-28 ENCOUNTER — Encounter (INDEPENDENT_AMBULATORY_CARE_PROVIDER_SITE_OTHER): Payer: Self-pay | Admitting: Adult Health

## 2022-11-28 ENCOUNTER — Ambulatory Visit (INDEPENDENT_AMBULATORY_CARE_PROVIDER_SITE_OTHER): Payer: BC Managed Care – PPO | Admitting: Adult Health

## 2022-11-28 VITALS — BP 113/83 | HR 72 | Temp 98.2°F | Ht 68.0 in | Wt 233.0 lb

## 2022-11-28 DIAGNOSIS — E782 Mixed hyperlipidemia: Secondary | ICD-10-CM | POA: Diagnosis not present

## 2022-11-28 DIAGNOSIS — E559 Vitamin D deficiency, unspecified: Secondary | ICD-10-CM | POA: Diagnosis not present

## 2022-11-28 DIAGNOSIS — E669 Obesity, unspecified: Secondary | ICD-10-CM | POA: Diagnosis not present

## 2022-11-28 DIAGNOSIS — E88819 Insulin resistance, unspecified: Secondary | ICD-10-CM

## 2022-11-28 DIAGNOSIS — Z6835 Body mass index (BMI) 35.0-35.9, adult: Secondary | ICD-10-CM

## 2022-11-28 MED ORDER — OMEGA-3-ACID ETHYL ESTERS 1 G PO CAPS
2.0000 g | ORAL_CAPSULE | Freq: Two times a day (BID) | ORAL | 0 refills | Status: DC
Start: 2022-11-28 — End: 2022-11-28

## 2022-11-28 MED ORDER — ZEPBOUND 5 MG/0.5ML ~~LOC~~ SOAJ: 5.0000 mg | SUBCUTANEOUS | 0 refills | Status: DC

## 2022-11-28 NOTE — Progress Notes (Signed)
WEIGHT SUMMARY AND BIOMETRICS  Vitals Temp: 98.2 F (36.8 C) BP: 113/83 Pulse Rate: 72 SpO2: 98 %   Anthropometric Measurements Height: 5\' 8"  (1.727 m) Weight: 233 lb (105.7 kg) BMI (Calculated): 35.44 Weight at Last Visit: 246 lb Weight Lost Since Last Visit: 13 lb Weight Gained Since Last Visit: 0 Starting Weight: 258 lb Total Weight Loss (lbs): 25 lb (11.3 kg) Peak Weight: 260 lb   Body Composition  Body Fat %: 29.6 % Fat Mass (lbs): 69.2 lbs Muscle Mass (lbs): 156.4 lbs Total Body Water (lbs): 112.6 lbs Visceral Fat Rating : 15   Other Clinical Data Fasting: yes Labs: no Today's Visit #: 12 Starting Date: 12/23/21    Chief Complaint:   OBESITY Charles Diaz is here to discuss his progress with his obesity treatment plan. He is on the the Category 3 Plan and states he is following his eating plan approximately 75 % of the time. He states he is exercising Cardio/Strength Training(ST)/Soccer 45-50 minutes 3 times per week.   Interim History:  He and his wife welcomed a baby boy 11/19/2022!!!  He re-started Zepbound therapy- he has had 4 doses of 5mg  strength Denies mass in neck, dysphagia, dyspepsia, persistent hoarseness, abdominal pain, or N/V/C   Hunger/appetite-appetite well controlled with weekly Zepbound 5mg   Exercise-he has resumed weekly ST on Wednesdays at [place]. He is also playing in an adult soccer league on Wednesdays  Reviewed Bioimpedance Results with pt: Muscle Mass: -4.2 lbs Adipose Mass: - 8.2 lbs  Subjective:   1. Elevated triglycerides with high cholesterol Lipid Panel     Component Value Date/Time   CHOL 173 06/03/2022 0924   CHOL 326 (H) 12/23/2021 0836   TRIG 279.0 (H) 06/03/2022 0924   HDL 34.90 (L) 06/03/2022 0924   HDL 23 (L) 12/23/2021 0836   CHOLHDL 5 06/03/2022 0924   VLDL 55.8 (H) 06/03/2022 0924   LDLCALC Comment (A) 12/23/2021 0836   LDLDIRECT 69.0 06/03/2022 0924   LABVLDL Comment (A) 12/23/2021 0836   He is on  Lovaza 1g- 2 caps BID He denies tobacco/vapue use. He denies CP with exertion.  2. Insulin resistance 10/31/2022- restarted Zepbound 5mg  weekly injection Denies mass in neck, dysphagia, dyspepsia, persistent hoarseness, abdominal pain, or N/V/C   Of note- His insurance denied the loading dose of Zepbound 2.5mg - therefore he resumed Zepbound at lowest mx dose- 5mg  He was off Zepbound month of April due to [employer].  3. Vitamin D deficiency He is on daily OTC Vit D3 5,000 IU  Latest Reference Range & Units 07/16/20 09:52 12/23/21 08:36 06/08/22 09:23  Vitamin D, 25-Hydroxy 30.0 - 100.0 ng/mL 22.4 (L) 18.3 (L) 33.2  (L): Data is abnormally low  Assessment/Plan:   1. Elevated triglycerides with high cholesterol Refill - omega-3 acid ethyl esters (LOVAZA) 1 g capsule; Take 2 capsules (2 g total) by mouth 2 (two) times daily.  Dispense: 120 capsule; Refill: 0 Check Labs - Comprehensive metabolic panel - Lipid panel  2. Insulin resistance Check Labs - Hemoglobin A1c - Insulin, random  3. Vitamin D deficiency Check Labs - VITAMIN D 25 Hydroxy (Vit-D Deficiency, Fractures)  4. Obesity, Current BMI 35.5 Refill tirzepatide (ZEPBOUND) 5 MG/0.5ML Pen Inject 5 mg into the skin once a week. Dispense: 2 mL, Refills: 0 ordered   Charles Diaz is currently in the action stage of change. As such, his goal is to continue with weight loss efforts. He has agreed to the Category 3 Plan.  Exercise goals: For substantial health benefits, adults should do at least 150 minutes (2 hours and 30 minutes) a week of moderate-intensity, or 75 minutes (1 hour and 15 minutes) a week of vigorous-intensity aerobic physical activity, or an equivalent combination of moderate- and vigorous-intensity aerobic activity. Aerobic activity should be performed in episodes of at least 10 minutes, and preferably, it should be spread throughout the week.  Behavioral modification strategies: increasing lean protein  intake, decreasing simple carbohydrates, increasing vegetables, increasing water intake, no skipping meals, meal planning and cooking strategies, and planning for success.  Charles Diaz has agreed to follow-up with our clinic in 4 weeks. He was informed of the importance of frequent follow-up visits to maximize his success with intensive lifestyle modifications for his multiple health conditions.   Charles Diaz was informed we would discuss his lab results at his next visit unless there is a critical issue that needs to be addressed sooner. Charles Diaz agreed to keep his next visit at the agreed upon time to discuss these results.  Objective:   Blood pressure 113/83, pulse 72, temperature 98.2 F (36.8 C), height 5\' 8"  (1.727 m), weight 233 lb (105.7 kg), SpO2 98 %. Body mass index is 35.43 kg/m.  General: Cooperative, alert, well developed, in no acute distress. HEENT: Conjunctivae and lids unremarkable. Cardiovascular: Regular rhythm.  Lungs: Normal work of breathing. Neurologic: No focal deficits.   Lab Results  Component Value Date   CREATININE 1.17 12/23/2021   BUN 13 12/23/2021   NA 137 12/23/2021   K 3.9 12/23/2021   CL 101 12/23/2021   CO2 18 (L) 12/23/2021   Lab Results  Component Value Date   ALT 35 06/03/2022   AST 25 06/03/2022   ALKPHOS 79 06/03/2022   BILITOT 0.6 06/03/2022   Lab Results  Component Value Date   HGBA1C 5.4 12/23/2021   HGBA1C 5.4 01/27/2020   HGBA1C 5.3 02/23/2018   Lab Results  Component Value Date   INSULIN 9.1 12/23/2021   INSULIN 15.5 07/16/2020   INSULIN 15.4 03/16/2020   INSULIN 14.4 01/27/2020   Lab Results  Component Value Date   TSH 2.95 06/03/2022   Lab Results  Component Value Date   CHOL 173 06/03/2022   HDL 34.90 (L) 06/03/2022   LDLCALC Comment (A) 12/23/2021   LDLDIRECT 69.0 06/03/2022   TRIG 279.0 (H) 06/03/2022   CHOLHDL 5 06/03/2022   Lab Results  Component Value Date   VD25OH 33.2 06/08/2022   VD25OH 18.3 (L) 12/23/2021    VD25OH 37.64 10/01/2021   Lab Results  Component Value Date   WBC 6.9 06/03/2022   HGB 14.8 06/03/2022   HCT 43.0 06/03/2022   MCV 85.2 06/03/2022   PLT 235.0 06/03/2022   Lab Results  Component Value Date   IRON 89 10/01/2021   TIBC 357.0 10/01/2021   FERRITIN 72.9 10/01/2021    Attestation Statements:   Reviewed by clinician on day of visit: allergies, medications, problem list, medical history, surgical history, family history, social history, and previous encounter notes.  I have reviewed the above documentation for accuracy and completeness, and I agree with the above. -  Charles Toya d. Saulo Anthis, NP-C

## 2022-11-29 ENCOUNTER — Encounter (INDEPENDENT_AMBULATORY_CARE_PROVIDER_SITE_OTHER): Payer: Self-pay | Admitting: Adult Health

## 2022-11-29 ENCOUNTER — Other Ambulatory Visit (INDEPENDENT_AMBULATORY_CARE_PROVIDER_SITE_OTHER): Payer: Self-pay | Admitting: Adult Health

## 2022-11-29 LAB — LIPID PANEL
Chol/HDL Ratio: 4.3 ratio (ref 0.0–5.0)
Cholesterol, Total: 142 mg/dL (ref 100–199)
HDL: 33 mg/dL — ABNORMAL LOW (ref >39)
LDL Chol Calc (NIH): 71 mg/dL (ref 0–99)
Triglycerides: 228 mg/dL — ABNORMAL HIGH (ref 0–149)
VLDL Cholesterol Cal: 38 mg/dL (ref 5–40)

## 2022-11-29 LAB — COMPREHENSIVE METABOLIC PANEL
ALT: 20 IU/L (ref 0–44)
AST: 17 IU/L (ref 0–40)
Albumin/Globulin Ratio: 1.8
Albumin: 4.6 g/dL (ref 4.1–5.1)
Alkaline Phosphatase: 109 IU/L (ref 44–121)
BUN/Creatinine Ratio: 13 (ref 9–20)
BUN: 14 mg/dL (ref 6–24)
Bilirubin Total: 0.4 mg/dL (ref 0.0–1.2)
CO2: 23 mmol/L (ref 20–29)
Calcium: 9.5 mg/dL (ref 8.7–10.2)
Chloride: 107 mmol/L — ABNORMAL HIGH (ref 96–106)
Creatinine, Ser: 1.12 mg/dL (ref 0.76–1.27)
Globulin, Total: 2.5 g/dL (ref 1.5–4.5)
Glucose: 88 mg/dL (ref 70–99)
Potassium: 4.3 mmol/L (ref 3.5–5.2)
Sodium: 143 mmol/L (ref 134–144)
Total Protein: 7.1 g/dL (ref 6.0–8.5)
eGFR: 85 mL/min/{1.73_m2} (ref >59)

## 2022-11-29 LAB — HEMOGLOBIN A1C
Est. average glucose Bld gHb Est-mCnc: 105 mg/dL
Hgb A1c MFr Bld: 5.3 % (ref 4.8–5.6)

## 2022-11-29 LAB — VITAMIN D 25 HYDROXY (VIT D DEFICIENCY, FRACTURES): Vit D, 25-Hydroxy: 38.4 ng/mL (ref 30.0–100.0)

## 2022-11-29 LAB — INSULIN, RANDOM: INSULIN: 16.9 u[IU]/mL (ref 2.6–24.9)

## 2022-11-29 MED ORDER — ZEPBOUND 5 MG/0.5ML ~~LOC~~ SOAJ: 5.0000 mg | SUBCUTANEOUS | 0 refills | Status: DC

## 2023-01-03 ENCOUNTER — Ambulatory Visit (INDEPENDENT_AMBULATORY_CARE_PROVIDER_SITE_OTHER): Payer: BC Managed Care – PPO | Admitting: Adult Health

## 2023-01-03 ENCOUNTER — Encounter (INDEPENDENT_AMBULATORY_CARE_PROVIDER_SITE_OTHER): Payer: Self-pay | Admitting: Adult Health

## 2023-01-03 VITALS — BP 95/64 | HR 78 | Temp 98.4°F | Ht 68.0 in | Wt 229.0 lb

## 2023-01-03 DIAGNOSIS — E88819 Insulin resistance, unspecified: Secondary | ICD-10-CM | POA: Diagnosis not present

## 2023-01-03 DIAGNOSIS — E669 Obesity, unspecified: Secondary | ICD-10-CM

## 2023-01-03 DIAGNOSIS — E559 Vitamin D deficiency, unspecified: Secondary | ICD-10-CM

## 2023-01-03 DIAGNOSIS — Z6834 Body mass index (BMI) 34.0-34.9, adult: Secondary | ICD-10-CM

## 2023-01-03 DIAGNOSIS — E782 Mixed hyperlipidemia: Secondary | ICD-10-CM | POA: Diagnosis not present

## 2023-01-03 NOTE — Progress Notes (Signed)
WEIGHT SUMMARY AND BIOMETRICS  Vitals Temp: 98.4 F (36.9 C) BP: 95/64 Pulse Rate: 78 SpO2: 98 %   Anthropometric Measurements Height: 5\' 8"  (1.727 m) Weight: 229 lb (103.9 kg) BMI (Calculated): 34.83 Weight at Last Visit: 233 lb Weight Lost Since Last Visit: 4 Weight Gained Since Last Visit: 0 Starting Weight: 258 lb Total Weight Loss (lbs): 29 lb (13.2 kg) Peak Weight: 260 lb   Body Composition  Body Fat %: 28.9 % Fat Mass (lbs): 66.4 lbs Muscle Mass (lbs): 155.4 lbs Total Body Water (lbs): 109.6 lbs Visceral Fat Rating : 14   Other Clinical Data Fasting: yes Labs: no Today's Visit #: 13 Starting Date: 12/23/21    Chief Complaint:   OBESITY Charles Diaz is here to discuss his progress with his obesity treatment plan. He is on the the Category 3 Plan and states he is following his eating plan approximately 50 % of the time. He states he is exercising HITT/Soccer 35 minutes 2-3 times per week.   Interim History:  He started on Zepbound 2.5mg  once weekly late Feb 2024 He increased to Zepbound 5mg  on/about 11/02/2022  Last refill of Zepbound 5mg , insurance required 90 day Rx  Denies mass in neck, dysphagia, dyspepsia, persistent hoarseness, abdominal pain, or N/V/C   Of Note- His wife continues on Maternity Leave until early Sept 2024 Charles Diaz just started his 6 week Paternity Leave. The newly expended family is ALSO MOVING!  Subjective:   1. Elevated triglycerides with high cholesterol Discussed Labs Lipid Panel     Component Value Date/Time   CHOL 142 11/28/2022 1148   TRIG 228 (H) 11/28/2022 1148   HDL 33 (L) 11/28/2022 1148   CHOLHDL 4.3 11/28/2022 1148   CHOLHDL 5 06/03/2022 0924   VLDL 55.8 (H) 06/03/2022 0924   LDLCALC 71 11/28/2022 1148   LDLDIRECT 69.0 06/03/2022 0924   LABVLDL 38 11/28/2022 1148   He is on Lovaza 1g  2caps BID He   2. Insulin resistance Discussed Labs  3. Vit D Def Discussed Labs  Latest Reference Range &  Units 06/08/22 09:23 11/28/22 11:48  Vitamin D, 25-Hydroxy 30.0 - 100.0 ng/mL 33.2 38.4  He is on OTC Vit D3 5,000 daily He reports fatigue r/t recent birth infant  Assessment/Plan:   1. Elevated triglycerides with high cholesterol Continue Fenofibrate and Lovaza Continue to limit sat fat and regular exercise  2. Insulin resistance Continue weekly GIP/GLP-1 therapy- 90 day supple recently provided.  3. Vit D Def Continue daily OTC Vit D supplement  4.Obesity, Current BMI 34.9  Charles Diaz is currently in the action stage of change. As such, his goal is to continue with weight loss efforts. He has agreed to the Category 3 Plan.   Exercise goals: For substantial health benefits, adults should do at least 150 minutes (2 hours and 30 minutes) a week of moderate-intensity, or 75 minutes (1 hour and 15 minutes) a week of vigorous-intensity aerobic physical activity, or an equivalent combination of moderate- and vigorous-intensity aerobic activity. Aerobic activity should be performed in episodes of at least 10 minutes, and preferably, it should be spread throughout the week.  Behavioral modification strategies: increasing lean protein intake, decreasing simple carbohydrates, increasing vegetables, increasing water intake, decreasing eating out, no skipping meals, meal planning and cooking strategies, and planning for success.  Charles Diaz has agreed to follow-up with our clinic in 4 weeks. He was informed of the importance of frequent follow-up visits to maximize his success with intensive lifestyle  modifications for his multiple health conditions.   Objective:   Blood pressure 95/64, pulse 78, temperature 98.4 F (36.9 C), height 5\' 8"  (1.727 m), weight 229 lb (103.9 kg), SpO2 98%. Body mass index is 34.82 kg/m.  General: Cooperative, alert, well developed, in no acute distress. HEENT: Conjunctivae and lids unremarkable. Cardiovascular: Regular rhythm.  Lungs: Normal work of breathing. Neurologic:  No focal deficits.   Lab Results  Component Value Date   CREATININE 1.12 11/28/2022   BUN 14 11/28/2022   NA 143 11/28/2022   K 4.3 11/28/2022   CL 107 (H) 11/28/2022   CO2 23 11/28/2022   Lab Results  Component Value Date   ALT 20 11/28/2022   AST 17 11/28/2022   ALKPHOS 109 11/28/2022   BILITOT 0.4 11/28/2022   Lab Results  Component Value Date   HGBA1C 5.3 11/28/2022   HGBA1C 5.4 12/23/2021   HGBA1C 5.4 01/27/2020   HGBA1C 5.3 02/23/2018   Lab Results  Component Value Date   INSULIN 16.9 11/28/2022   INSULIN 9.1 12/23/2021   INSULIN 15.5 07/16/2020   INSULIN 15.4 03/16/2020   INSULIN 14.4 01/27/2020   Lab Results  Component Value Date   TSH 2.95 06/03/2022   Lab Results  Component Value Date   CHOL 142 11/28/2022   HDL 33 (L) 11/28/2022   LDLCALC 71 11/28/2022   LDLDIRECT 69.0 06/03/2022   TRIG 228 (H) 11/28/2022   CHOLHDL 4.3 11/28/2022   Lab Results  Component Value Date   VD25OH 38.4 11/28/2022   VD25OH 33.2 06/08/2022   VD25OH 18.3 (L) 12/23/2021   Lab Results  Component Value Date   WBC 6.9 06/03/2022   HGB 14.8 06/03/2022   HCT 43.0 06/03/2022   MCV 85.2 06/03/2022   PLT 235.0 06/03/2022   Lab Results  Component Value Date   IRON 89 10/01/2021   TIBC 357.0 10/01/2021   FERRITIN 72.9 10/01/2021    Attestation Statements:   Reviewed by clinician on day of visit: allergies, medications, problem list, medical history, surgical history, family history, social history, and previous encounter notes.  I have reviewed the above documentation for accuracy and completeness, and I agree with the above. -  Odie Edmonds d. Gianlucca Szymborski, NP-C

## 2023-01-11 ENCOUNTER — Ambulatory Visit
Admission: RE | Admit: 2023-01-11 | Discharge: 2023-01-11 | Disposition: A | Discharge status: Home / Self Care | Payer: BC Managed Care – PPO | Source: Ambulatory Visit | Attending: Internal Medicine | Admitting: Internal Medicine

## 2023-01-11 VITALS — BP 121/79 | HR 69 | Temp 98.3°F | Resp 18

## 2023-01-11 DIAGNOSIS — H109 Unspecified conjunctivitis: Secondary | ICD-10-CM | POA: Diagnosis not present

## 2023-01-11 MED ORDER — GENTAMICIN SULFATE 0.3 % OP SOLN: 2.0000 [drp] | OPHTHALMIC | 0 refills | Status: AC

## 2023-01-11 NOTE — Discharge Instructions (Signed)
You have bacterial conjunctivitis (pink eye) which is an eye infection.    - Use antibiotic eye medication sent to pharmacy as directed.  - Change your pillowcase after 2 to 3 days to avoid reinfection.  - You may take Tylenol every 6 hours as needed for any pain you may have.  - Avoid scratching your eye.  Wash your hands frequently to avoid spread of infection to others.  Perform warm compresses to your eye before applying the eye medication.  If you wear contacts, do not use contacts for 14 days. Instead, use your eye glasses for vision correction. Follow-up with eye doctor as needed for new or worsening symptoms.   If you develop any new or worsening symptoms or do not improve in the next 2 to 3 days, please return.  If your symptoms are severe, please go to the emergency room.  Follow-up with your primary care provider for further evaluation and management of your symptoms as well as ongoing wellness visits.  I hope you feel better! 

## 2023-01-11 NOTE — ED Provider Notes (Signed)
[facility]    CSN: [id number] Arrival date & time: 01/11/23  1424      History   Chief Complaint Chief Complaint  Patient presents with   Eye Problem    Redness and irritation in eye - Entered by patient    HPI Charles Diaz is a 42 y.o. male.   Patient presents to urgent care for evaluation of left eye redness, swelling, and crusting drainage that started 4 days ago.  No symptoms to the right eye.  No vision changes, dizziness, viral URI symptoms, rash, fever/chills, headaches, or recent trauma or injury to the left eye.  No recent contacts with similar symptoms.  Describes discomfort as a "gritty sensation" to the left eye.  He woke up with drainage/crusting to the left eye a couple of mornings ago and has been managing this with Systane eyedrops without much relief.  He does not wear glasses or contacts.   Eye Problem   Past Medical History:  Diagnosis Date   Dyslipidemia    Fatty liver    High cholesterol    High triglycerides    Lactose intolerance    Vitamin D deficiency     Patient Active Problem List   Diagnosis Date Noted   Hypertriglyceridemia 05/18/2022   Insulin resistance 02/07/2022   Other fatigue 12/23/2021   SOB (shortness of breath) on exertion 12/23/2021   Health care maintenance 12/23/2021   Elevated glucose 12/23/2021   Elevated liver enzymes 01/28/2020   Elevated triglycerides with high cholesterol 01/28/2020   Vitamin D deficiency 01/28/2020   Class 2 obesity due to excess calories with body mass index (BMI) of 39.0 to 39.9 in adult 01/28/2020   Dyslipidemia    PCP NOTES >>>>>>>>>>>>>>> 02/14/2016   Annual physical exam 01/30/2013   Skin lesion 01/30/2013    Past Surgical History:  Procedure Laterality Date   KNEE ARTHROSCOPY Right 2009       Home Medications    Prior to Admission medications   Medication Sig Start Date End Date Taking? Authorizing Provider  gentamicin (GARAMYCIN) 0.3 % ophthalmic solution Place 2  drops into the left eye every 4 (four) hours for 7 days. 01/11/23 01/18/23 Yes StanhopeDonavan Burnet, FNP  Cholecalciferol (VITAMIN D) 125 MCG (5000 UT) CAPS Take 1 capsule by mouth daily. 05/18/22   Whitmire, Thermon Leyland, FNP  fenofibrate 160 MG tablet Take 1 tablet (160 mg total) by mouth daily. 10/17/22   Wanda Plump, MD  Multiple Vitamin (MULTI-VITAMIN DAILY PO) Take by mouth.    [provider]  omega-3 acid ethyl esters (LOVAZA) 1 g capsule TAKE 2 CAPSULE BY MOUTH TWICE DAILY 11/28/22   Danford, Orpha Bur D, NP  tirzepatide (ZEPBOUND) 5 MG/0.5ML Pen Inject 5 mg into the skin once a week. 11/29/22   Julaine Fusi, NP    Family History Family History  Problem Relation Age of Onset   Diabetes Other        GM x2   Hypertension Father    Cancer Other        uncle, stomach ca?   Diabetes Mother    High blood pressure Mother    Obesity Mother    Colon cancer Neg Hx    Prostate cancer Neg Hx    CAD Neg Hx     Social History Social History   Tobacco Use   Smoking status: Former    Current packs/day: 0.00    Types: Cigarettes    Quit date: 03/26/2018  Years since quitting: 4.8   Smokeless tobacco: Never   Tobacco comments:       Substance Use Topics   Alcohol use: Yes    Comment: socially   Drug use: No     Allergies   Patient has no known allergies.   Review of Systems Review of Systems Per HPI  Physical Exam Triage Vital Signs ED Triage Vitals  Encounter Vitals Group     BP 01/11/23 1435 121/79     Systolic BP Percentile --      Diastolic BP Percentile --      Pulse Rate 01/11/23 1435 69     Resp 01/11/23 1435 18     Temp 01/11/23 1435 98.3 F (36.8 C)     Temp Source 01/11/23 1435 Oral     SpO2 01/11/23 1435 97 %     Weight --      Height --      Head Circumference --      Peak Flow --      Pain Score 01/11/23 1436 0     Pain Loc --      Pain Education --      Exclude from Growth Chart --    No data found.  Updated Vital Signs BP 121/79 (BP  Location: Left Arm)   Pulse 69   Temp 98.3 F (36.8 C) (Oral)   Resp 18   SpO2 97%   Visual Acuity Right Eye Distance:   Left Eye Distance:   Bilateral Distance:    Right Eye Near:   Left Eye Near:    Bilateral Near:     Physical Exam Vitals and nursing note reviewed.  Constitutional:      Appearance: He is not ill-appearing or toxic-appearing.  HENT:     Head: Normocephalic and atraumatic.     Right Ear: Hearing and external ear normal.     Left Ear: Hearing and external ear normal.     Nose: Nose normal.     Mouth/Throat:     Lips: Pink.     Mouth: Mucous membranes are moist. No injury.     Tongue: No lesions. Tongue does not deviate from midline.     Palate: No mass and lesions.     Pharynx: Oropharynx is clear. Uvula midline. No pharyngeal swelling, oropharyngeal exudate, posterior oropharyngeal erythema or uvula swelling.     Tonsils: No tonsillar exudate or tonsillar abscesses.  Eyes:     General: Lids are normal. Vision grossly intact. Gaze aligned appropriately.        Right eye: No foreign body, discharge or hordeolum.        Left eye: No foreign body, discharge or hordeolum.     Extraocular Movements: Extraocular movements intact.     Conjunctiva/sclera:     Right eye: Right conjunctiva is not injected. No chemosis, exudate or hemorrhage.    Left eye: Left conjunctiva is injected. No chemosis, exudate or hemorrhage.    Comments: EOMs intact without pain or dizziness elicited.  Pulmonary:     Effort: Pulmonary effort is normal.  Musculoskeletal:     Cervical back: Full passive range of motion without pain and neck supple.  Lymphadenopathy:     Cervical: No cervical adenopathy.  Skin:    General: Skin is warm and dry.     Capillary Refill: Capillary refill takes less than 2 seconds.     Findings: No rash.  Neurological:     General: No focal deficit present.  Mental Status: He is alert and oriented to person, place, and time. Mental status is at  baseline.     Cranial Nerves: No dysarthria or facial asymmetry.  Psychiatric:        Mood and Affect: Mood normal.        Speech: Speech normal.        Behavior: Behavior normal.        Thought Content: Thought content normal.        Judgment: Judgment normal.      UC Treatments / Results  Labs (all labs ordered are listed, but only abnormal results are displayed) Labs Reviewed - No data to display  EKG   Radiology No results found.  Procedures Procedures (including critical care time)  Medications Ordered in UC Medications - No data to display  Initial Impression / Assessment and Plan / UC Course  I have reviewed the triage vital signs and the nursing notes.  Pertinent labs & imaging results that were available during my care of the patient were reviewed by me and considered in my medical decision making (see chart for details).   1.  Bacterial conjunctivitis of left eye Presentation consistent with acute bacterial conjunctivitis.  HEENT exam stable.  Ophthalmic medication as prescribed for the next 7 days.  Warm compress recommended frequently.  Over the counter medications as needed for aches/pains. Hand hygiene discussed to prevent spread of infection to others.  Advised to change pillowcase after 2 to 3 days of antibiotics to avoid reinfection.   Counseled patient on potential for adverse effects with medications prescribed/recommended today, strict ER and return-to-clinic precautions discussed, patient verbalized understanding.    Final Clinical Impressions(s) / UC Diagnoses   Final diagnoses:  Bacterial conjunctivitis of left eye     Discharge Instructions      You have bacterial conjunctivitis (pink eye) which is an eye infection.    - Use antibiotic eye medication sent to pharmacy as directed.  - Change your pillowcase after 2 to 3 days to avoid reinfection.  - You may take Tylenol every 6 hours as needed for any pain you may have.  - Avoid  scratching your eye.  Wash your hands frequently to avoid spread of infection to others.  Perform warm compresses to your eye before applying the eye medication.  If you wear contacts, do not use contacts for 14 days. Instead, use your eye glasses for vision correction. Follow-up with eye doctor as needed for new or worsening symptoms.   If you develop any new or worsening symptoms or do not improve in the next 2 to 3 days, please return.  If your symptoms are severe, please go to the emergency room.  Follow-up with your primary care provider for further evaluation and management of your symptoms as well as ongoing wellness visits.  I hope you feel better!     ED Prescriptions     Medication Sig Dispense Auth. Provider   gentamicin (GARAMYCIN) 0.3 % ophthalmic solution Place 2 drops into the left eye every 4 (four) hours for 7 days. 5 mL Carlisle Beers, FNP      PDMP not reviewed this encounter.   Carlisle Beers, [place] 01/11/23 [phone]

## 2023-01-11 NOTE — ED Triage Notes (Signed)
Left eye irritation, sensitive to light, and redness x 4 days.

## 2023-01-19 DIAGNOSIS — H17822 Peripheral opacity of cornea, left eye: Secondary | ICD-10-CM | POA: Diagnosis not present

## 2023-01-19 DIAGNOSIS — H16252 Phlyctenular keratoconjunctivitis, left eye: Secondary | ICD-10-CM | POA: Diagnosis not present

## 2023-01-19 DIAGNOSIS — H16142 Punctate keratitis, left eye: Secondary | ICD-10-CM | POA: Diagnosis not present

## 2023-01-30 DIAGNOSIS — H16252 Phlyctenular keratoconjunctivitis, left eye: Secondary | ICD-10-CM | POA: Diagnosis not present

## 2023-01-30 DIAGNOSIS — H17822 Peripheral opacity of cornea, left eye: Secondary | ICD-10-CM | POA: Diagnosis not present

## 2023-02-02 ENCOUNTER — Ambulatory Visit (INDEPENDENT_AMBULATORY_CARE_PROVIDER_SITE_OTHER): Payer: BC Managed Care – PPO | Admitting: Adult Health

## 2023-02-02 ENCOUNTER — Encounter (INDEPENDENT_AMBULATORY_CARE_PROVIDER_SITE_OTHER): Payer: Self-pay | Admitting: Adult Health

## 2023-02-02 VITALS — BP 107/76 | HR 79 | Temp 98.4°F | Ht 68.0 in | Wt 229.0 lb

## 2023-02-02 DIAGNOSIS — E669 Obesity, unspecified: Secondary | ICD-10-CM | POA: Diagnosis not present

## 2023-02-02 DIAGNOSIS — E88819 Insulin resistance, unspecified: Secondary | ICD-10-CM | POA: Diagnosis not present

## 2023-02-02 DIAGNOSIS — Z6834 Body mass index (BMI) 34.0-34.9, adult: Secondary | ICD-10-CM | POA: Diagnosis not present

## 2023-02-02 DIAGNOSIS — E782 Mixed hyperlipidemia: Secondary | ICD-10-CM | POA: Diagnosis not present

## 2023-02-02 MED ORDER — ZEPBOUND 7.5 MG/0.5ML ~~LOC~~ SOAJ
7.5000 mg | SUBCUTANEOUS | 0 refills | Status: DC
  Filled 2023-02-15: qty 2, 28d supply, fill #0

## 2023-02-02 NOTE — Progress Notes (Signed)
WEIGHT SUMMARY AND BIOMETRICS  Vitals Temp: 98.4 F (36.9 C) BP: 107/76 Pulse Rate: 79 SpO2: 98 %   Anthropometric Measurements Height: 5\' 8"  (1.727 m) Weight: 229 lb (103.9 kg) BMI (Calculated): 34.83 Weight at Last Visit: 229lb Weight Lost Since Last Visit: 0lb Weight Gained Since Last Visit: 0lb Starting Weight: 258 lb Total Weight Loss (lbs): 29 lb (13.2 kg) Peak Weight: 260 lb   Body Composition  Body Fat %: 28.8 % Fat Mass (lbs): 66.2 lbs Muscle Mass (lbs): 155.4 lbs Total Body Water (lbs): 111.4 lbs Visceral Fat Rating : 14   Other Clinical Data Fasting: n o Labs: no Today's Visit #: 14 Starting Date: 12/23/21    Chief Complaint:   OBESITY Charles Diaz is here to discuss his progress with his obesity treatment plan. He is on the the Category 3 Plan and states he is following his eating plan approximately 25 % of the time. He states he is exercising - home move.   Interim History:  He will complete his Paternity Leave next week, then return to work at [employer]. His wife will complete her Maternity Leave in 2 weeks, then return to work.  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  Subjective:   1. Elevated triglycerides with high cholesterol Lipid Panel     Component Value Date/Time   CHOL 142 11/28/2022 1148   TRIG 228 (H) 11/28/2022 1148   HDL 33 (L) 11/28/2022 1148   CHOLHDL 4.3 11/28/2022 1148   CHOLHDL 5 06/03/2022 0924   VLDL 55.8 (H) 06/03/2022 0924   LDLCALC 71 11/28/2022 1148   LDLDIRECT 69.0 06/03/2022 0924   LABVLDL 38 11/28/2022 1148    fenofibrate 160 MG tablet  omega-3 acid ethyl esters (LOVAZA) 1 g capsule    2. Insulin resistance Restarted Zepbound 5mg  on/about 11/02/2022 He has been on lowest mx dose for 3 months, notes increased  polyphagia Denies mass in neck, dysphagia, dyspepsia, persistent hoarseness, abdominal pain, or N/V/C   Assessment/Plan:   1. Elevated triglycerides with high cholesterol Continue fenofibrate 160 MG tablet  omega-3 acid ethyl esters (LOVAZA) 1 g capsule   When able, resume cardiovascular exercise  2. Insulin resistance Continue weeklly GIP/GLP-1 therapy  3. Obesity, Current BMI 34.9 Refill and increase tirzepatide (ZEPBOUND) 7.5 MG/0.5ML Pen Inject 7.5 mg into the skin once a week. Dispense: 6 mL, Refills: 0 ordered   [name] is currently in the action stage of change. As such, his goal is to continue with weight loss efforts. He has agreed to the Category 3 Plan.   Exercise goals: For substantial health benefits, adults should do at least 150 minutes (2 hours and 30 minutes) a week of moderate-intensity, or 75 minutes (1 hour and 15 minutes) a week of vigorous-intensity aerobic physical activity, or an equivalent combination of moderate- and vigorous-intensity aerobic activity. Aerobic activity should be performed in episodes of at least 10 minutes, and preferably, it should be spread throughout the week.  Behavioral modification strategies: increasing lean protein intake, decreasing simple carbohydrates, increasing vegetables, increasing water intake, decreasing eating out, no skipping meals, meal planning and cooking strategies, ways to avoid boredom eating, and planning for success.  [name] has agreed to follow-up with our clinic in 4 weeks. He was informed of the importance of frequent follow-up  visits to maximize his success with intensive lifestyle modifications for his multiple health conditions.   Objective:   Blood pressure 107/76, pulse 79, temperature 98.4 F (36.9 C), height 5\' 8"  (1.727 m), weight 229 lb (103.9 kg), SpO2 98%. Body mass index is 34.82 kg/m.  General: Cooperative, alert, well developed, in no acute distress. HEENT: Conjunctivae and lids  unremarkable. Cardiovascular: Regular rhythm.  Lungs: Normal work of breathing. Neurologic: No focal deficits.   Lab Results  Component Value Date   CREATININE 1.12 11/28/2022   BUN 14 11/28/2022   NA 143 11/28/2022   K 4.3 11/28/2022   CL 107 (H) 11/28/2022   CO2 23 11/28/2022   Lab Results  Component Value Date   ALT 20 11/28/2022   AST 17 11/28/2022   ALKPHOS 109 11/28/2022   BILITOT 0.4 11/28/2022   Lab Results  Component Value Date   HGBA1C 5.3 11/28/2022   HGBA1C 5.4 12/23/2021   HGBA1C 5.4 01/27/2020   HGBA1C 5.3 02/23/2018   Lab Results  Component Value Date   INSULIN 16.9 11/28/2022   INSULIN 9.1 12/23/2021   INSULIN 15.5 07/16/2020   INSULIN 15.4 03/16/2020   INSULIN 14.4 01/27/2020   Lab Results  Component Value Date   TSH 2.95 06/03/2022   Lab Results  Component Value Date   CHOL 142 11/28/2022   HDL 33 (L) 11/28/2022   LDLCALC 71 11/28/2022   LDLDIRECT 69.0 06/03/2022   TRIG 228 (H) 11/28/2022   CHOLHDL 4.3 11/28/2022   Lab Results  Component Value Date   VD25OH 38.4 11/28/2022   VD25OH 33.2 06/08/2022   VD25OH 18.3 (L) 12/23/2021   Lab Results  Component Value Date   WBC 6.9 06/03/2022   HGB 14.8 06/03/2022   HCT 43.0 06/03/2022   MCV 85.2 06/03/2022   PLT 235.0 06/03/2022   Lab Results  Component Value Date   IRON 89 10/01/2021   TIBC 357.0 10/01/2021   FERRITIN 72.9 10/01/2021    Attestation Statements:   Reviewed by clinician on day of visit: allergies, medications, problem list, medical history, surgical history, family history, social history, and previous encounter notes.  I have reviewed the above documentation for accuracy and completeness, and I agree with the above. -   d. , NP-C

## 2023-02-15 ENCOUNTER — Other Ambulatory Visit (HOSPITAL_COMMUNITY): Payer: Self-pay

## 2023-03-06 ENCOUNTER — Telehealth (INDEPENDENT_AMBULATORY_CARE_PROVIDER_SITE_OTHER): Payer: Self-pay

## 2023-03-06 NOTE — Telephone Encounter (Signed)
PA submitted for Zepbound, waiting on a determination.12:23 03/06/2023 KP

## 2023-03-06 NOTE — Telephone Encounter (Signed)
PA for Zepbound has been approved until 11/01/23. 12:31 03/06/23 KP

## 2023-03-07 ENCOUNTER — Encounter (INDEPENDENT_AMBULATORY_CARE_PROVIDER_SITE_OTHER): Payer: Self-pay | Admitting: Adult Health

## 2023-03-07 ENCOUNTER — Other Ambulatory Visit (HOSPITAL_COMMUNITY): Payer: Self-pay

## 2023-03-07 ENCOUNTER — Ambulatory Visit (INDEPENDENT_AMBULATORY_CARE_PROVIDER_SITE_OTHER): Payer: BC Managed Care – PPO | Admitting: Adult Health

## 2023-03-07 VITALS — BP 93/65 | HR 68 | Temp 98.4°F | Ht 68.0 in | Wt 228.0 lb

## 2023-03-07 DIAGNOSIS — E559 Vitamin D deficiency, unspecified: Secondary | ICD-10-CM | POA: Diagnosis not present

## 2023-03-07 DIAGNOSIS — E669 Obesity, unspecified: Secondary | ICD-10-CM

## 2023-03-07 DIAGNOSIS — E88819 Insulin resistance, unspecified: Secondary | ICD-10-CM

## 2023-03-07 DIAGNOSIS — E785 Hyperlipidemia, unspecified: Secondary | ICD-10-CM

## 2023-03-07 DIAGNOSIS — E782 Mixed hyperlipidemia: Secondary | ICD-10-CM

## 2023-03-07 DIAGNOSIS — Z6834 Body mass index (BMI) 34.0-34.9, adult: Secondary | ICD-10-CM

## 2023-03-07 MED ORDER — ZEPBOUND 7.5 MG/0.5ML ~~LOC~~ SOAJ
7.5000 mg | SUBCUTANEOUS | 0 refills | Status: DC
  Filled 2023-03-07 – 2023-03-23 (×2): qty 2, 28d supply, fill #0

## 2023-03-07 NOTE — Progress Notes (Signed)
WEIGHT SUMMARY AND BIOMETRICS  Vitals Temp: 98.4 F (36.9 C) BP: 93/65 Pulse Rate: 68 SpO2: 97 %   Anthropometric Measurements Height: 5\' 8"  (1.727 m) Weight: 228 lb (103.4 kg) BMI (Calculated): 34.68 Weight at Last Visit: 229lb Weight Lost Since Last Visit: 1lb Weight Gained Since Last Visit: 0 Starting Weight: 258lb Total Weight Loss (lbs): 30 lb (13.6 kg) Peak Weight: 260lb   Body Composition  Body Fat %: 30.2 % Fat Mass (lbs): 69 lbs Muscle Mass (lbs): 152 lbs Total Body Water (lbs): 112.2 lbs Visceral Fat Rating : 15   Other Clinical Data Fasting: no Labs: no Today's Visit #: 15 Starting Date: 12/23/21    Chief Complaint:   OBESITY Charles Diaz is here to discuss his progress with his obesity treatment plan. He is on the the Category 3 Plan and states he is following his eating plan approximately 80 % of the time. He states he is exercising Strength Training Class and Cardiovascular exercise 60 minutes 1 times per week.   Interim History:  Started on Zepbound therapy on/about Feb 2024 Zepbound increased to 7.5mg  on/about 02/02/2023 He has 2 doses at increased strength. Denies mass in neck, dysphagia, dyspepsia, persistent hoarseness, abdominal pain, or N/V/C   Exercise-he has resumed regular exercise classes at [facility] on Wednesday  Hydration-he estimates to drink 20-30 oz water/day  Reviewed Bioimpedance Results with pt: Muscle Mass: -3.4 lbs Adipose Mass: +2.8 lbs  Subjective:   1. Elevated triglycerides with high cholesterol Lipid Panel     Component Value Date/Time   CHOL 142 11/28/2022 1148   TRIG 228 (H) 11/28/2022 1148   HDL 33 (L) 11/28/2022 1148   CHOLHDL 4.3 11/28/2022 1148   CHOLHDL 5 06/03/2022 0924   VLDL 55.8 (H) 06/03/2022 0924   LDLCALC 71 11/28/2022 1148   LDLDIRECT 69.0 06/03/2022 0924   LABVLDL 38 11/28/2022 1148    PCP/Dr. Drue Novel started him Fenofibrate 160mg  daily. [facility]/Dr. Manson Passey started him Lovaza 1g 2 caps BID  He has  not been taking either medication since the home move in early August 2024.  2. Vitamin D deficiency  Latest Reference Range & Units 07/16/20 09:52 12/23/21 08:36 06/08/22 09:23 11/28/22 11:48  Vitamin D, 25-Hydroxy 30.0 - 100.0 ng/mL 22.4 (L) 18.3 (L) 33.2 38.4  (L): Data is abnormally low  He is on daily Cholecalciferol (VITAMIN D) 125 MCG (5000 UT) CAPS   3. Insulin resistance  Latest Reference Range & Units 03/16/20 08:26 07/16/20 09:52 12/23/21 08:36 11/28/22 11:48  INSULIN 2.6 - 24.9 uIU/mL 15.4 15.5 9.1 16.9   Started on Zepbound therapy on/about Feb 2024 Zepbound increased to 7.5mg  on/about 02/02/2023 He has 2 doses at increased strength. Denies mass in neck, dysphagia, dyspepsia, persistent hoarseness, abdominal pain, or N/V/C   He denies polyphagia  Assessment/Plan:   1. Elevated triglycerides with high cholesterol Restart Fenofibrate and Lovaza as directed. Check Labs Fall 2024  2. Vitamin D deficiency Continue Cholecalciferol (VITAMIN D) 125 MCG (5000 UT) CAPS   3. Insulin resistance Continue Cat 3 meal plan and regular exercise. Continue Zepbound therapy as directed.  4. Obesity, Current BMI 34.8 Refill tirzepatide (ZEPBOUND) 7.5 MG/0.5ML Pen Inject 7.5 mg into the skin once a week. Dispense: 2 mL, Refills: 0 of 0 remaining   [name] is currently in the action stage of change. As such, his goal is to continue with weight loss efforts. He has agreed to the Category 3 Plan.   Exercise goals: For substantial health benefits, adults should  do at least 150 minutes (2 hours and 30 minutes) a week of moderate-intensity, or 75 minutes (1 hour and 15 minutes) a week of vigorous-intensity aerobic physical activity, or an equivalent combination of moderate- and vigorous-intensity aerobic activity. Aerobic activity should be performed in episodes of at least 10 minutes, and preferably, it should be spread throughout the week.  Behavioral modification strategies: increasing lean  protein intake, decreasing simple carbohydrates, increasing vegetables, increasing water intake, no skipping meals, meal planning and cooking strategies, and planning for success.  [name] has agreed to follow-up with our clinic in 4 weeks. He was informed of the importance of frequent follow-up visits to maximize his success with intensive lifestyle modifications for his multiple health conditions.   Check Fasting Labs Fall 2024  Objective:   Blood pressure 93/65, pulse 68, temperature 98.4 F (36.9 C), height 5\' 8"  (1.727 m), weight 228 lb (103.4 kg), SpO2 97%. Body mass index is 34.67 kg/m.  General: Cooperative, alert, well developed, in no acute distress. HEENT: Conjunctivae and lids unremarkable. Cardiovascular: Regular rhythm.  Lungs: Normal work of breathing. Neurologic: No focal deficits.   Lab Results  Component Value Date   CREATININE 1.12 11/28/2022   BUN 14 11/28/2022   NA 143 11/28/2022   K 4.3 11/28/2022   CL 107 (H) 11/28/2022   CO2 23 11/28/2022   Lab Results  Component Value Date   ALT 20 11/28/2022   AST 17 11/28/2022   ALKPHOS 109 11/28/2022   BILITOT 0.4 11/28/2022   Lab Results  Component Value Date   HGBA1C 5.3 11/28/2022   HGBA1C 5.4 12/23/2021   HGBA1C 5.4 01/27/2020   HGBA1C 5.3 02/23/2018   Lab Results  Component Value Date   INSULIN 16.9 11/28/2022   INSULIN 9.1 12/23/2021   INSULIN 15.5 07/16/2020   INSULIN 15.4 03/16/2020   INSULIN 14.4 01/27/2020   Lab Results  Component Value Date   TSH 2.95 06/03/2022   Lab Results  Component Value Date   CHOL 142 11/28/2022   HDL 33 (L) 11/28/2022   LDLCALC 71 11/28/2022   LDLDIRECT 69.0 06/03/2022   TRIG 228 (H) 11/28/2022   CHOLHDL 4.3 11/28/2022   Lab Results  Component Value Date   VD25OH 38.4 11/28/2022   VD25OH 33.2 06/08/2022   VD25OH 18.3 (L) 12/23/2021   Lab Results  Component Value Date   WBC 6.9 06/03/2022   HGB 14.8 06/03/2022   HCT 43.0 06/03/2022   MCV 85.2  06/03/2022   PLT 235.0 06/03/2022   Lab Results  Component Value Date   IRON 89 10/01/2021   TIBC 357.0 10/01/2021   FERRITIN 72.9 10/01/2021   Attestation Statements:   Reviewed by clinician on day of visit: allergies, medications, problem list, medical history, surgical history, family history, social history, and previous encounter notes.  I have reviewed the above documentation for accuracy and completeness, and I agree with the above. -  Alisse Tuite d. Wilfred Dayrit, NP-C

## 2023-03-23 ENCOUNTER — Other Ambulatory Visit (HOSPITAL_COMMUNITY): Payer: Self-pay

## 2023-03-24 ENCOUNTER — Encounter (INDEPENDENT_AMBULATORY_CARE_PROVIDER_SITE_OTHER): Payer: Self-pay | Admitting: Adult Health

## 2023-03-27 ENCOUNTER — Telehealth (INDEPENDENT_AMBULATORY_CARE_PROVIDER_SITE_OTHER): Payer: Self-pay

## 2023-03-27 NOTE — Telephone Encounter (Signed)
I called and spoke to a representative at [phone] concerning Charles Diaz's prescription refill for Zepbound. They informed me his policy only allows four pens a year at the 7.5 dose, he will have to titrate up to 10 or complete a PLA (Patient Level Authorization). I attempted to complete the PLA, but it was denied due to him only missing one dose at this time. They stated he must miss at least two or more doses and he will need to complete an appeal at that time. They are sending him information to do that and they are sending [place] information as well. I called the patient and explained all of this to him, he is not ready to titrate up so he will complete the appeal after he misses his second dose next week. He also requested we reschedule his next appointment which I had someone at the front desk to do.

## 2023-04-04 ENCOUNTER — Ambulatory Visit (INDEPENDENT_AMBULATORY_CARE_PROVIDER_SITE_OTHER): Payer: BC Managed Care – PPO | Admitting: Adult Health

## 2023-04-11 ENCOUNTER — Other Ambulatory Visit (HOSPITAL_COMMUNITY): Payer: Self-pay

## 2023-04-11 ENCOUNTER — Encounter (INDEPENDENT_AMBULATORY_CARE_PROVIDER_SITE_OTHER): Payer: Self-pay | Admitting: Adult Health

## 2023-04-11 ENCOUNTER — Ambulatory Visit (INDEPENDENT_AMBULATORY_CARE_PROVIDER_SITE_OTHER): Payer: BC Managed Care – PPO | Admitting: Adult Health

## 2023-04-11 VITALS — BP 102/68 | HR 54 | Temp 97.7°F | Ht 68.0 in | Wt 233.0 lb

## 2023-04-11 DIAGNOSIS — Z6834 Body mass index (BMI) 34.0-34.9, adult: Secondary | ICD-10-CM

## 2023-04-11 DIAGNOSIS — E782 Mixed hyperlipidemia: Secondary | ICD-10-CM

## 2023-04-11 DIAGNOSIS — E669 Obesity, unspecified: Secondary | ICD-10-CM | POA: Diagnosis not present

## 2023-04-11 DIAGNOSIS — E559 Vitamin D deficiency, unspecified: Secondary | ICD-10-CM | POA: Diagnosis not present

## 2023-04-11 DIAGNOSIS — Z6839 Body mass index (BMI) 39.0-39.9, adult: Secondary | ICD-10-CM

## 2023-04-11 MED ORDER — TIRZEPATIDE-WEIGHT MANAGEMENT 10 MG/0.5ML ~~LOC~~ SOAJ
10.0000 mg | SUBCUTANEOUS | 0 refills | Status: DC
Start: 2023-04-11 — End: 2023-05-08
  Filled 2023-04-11: qty 2, 28d supply, fill #0

## 2023-04-11 NOTE — Progress Notes (Signed)
WEIGHT SUMMARY AND BIOMETRICS  Vitals Temp: 97.7 F (36.5 C) BP: 102/68 Pulse Rate: (!) 54 SpO2: 99 %   Anthropometric Measurements Height: 5\' 8"  (1.727 m) Weight: 233 lb (105.7 kg) BMI (Calculated): 35.44 Weight at Last Visit: 228lb Weight Lost Since Last Visit: 0 Weight Gained Since Last Visit: 5lb Starting Weight: 258lb Total Weight Loss (lbs): 25 lb (11.3 kg) Peak Weight: 260lb   Body Composition  Body Fat %: 30.7 % Fat Mass (lbs): 71.8 lbs Muscle Mass (lbs): 154 lbs Total Body Water (lbs): 114.4 lbs Visceral Fat Rating : 15   Other Clinical Data Fasting: no Labs: no Today's Visit #: 16 Starting Date: 12/23/21    Chief Complaint:   OBESITY Charles Diaz is here to discuss his progress with his obesity treatment plan. He is on the the Category 3 Plan and states he is following his eating plan approximately 75 % of the time. He states he is exercising Strength Training 60 minutes 1 times per week.   Interim History:  Started on Zepbound therapy on/about Feb 2024 Zepbound increased to 7.5mg  on/about 02/02/2023 He has 4 doses at increased strength. He missed last weeks dose due to insurance denial. He provided paperwork from his insurance carrier- Zepbound needs to be increased to 10mg  for continued coverage.   Reviewed Bioimpedance Results with pt: Muscle Mass: +2 lbs Adipose Mass: +2.8 lbs  Subjective:   1. Elevated triglycerides with high cholesterol Lipid Panel     Component Value Date/Time   CHOL 142 11/28/2022 1148   TRIG 228 (H) 11/28/2022 1148   HDL 33 (L) 11/28/2022 1148   CHOLHDL 4.3 11/28/2022 1148   CHOLHDL 5 06/03/2022 0924   VLDL 55.8 (H) 06/03/2022 0924   LDLCALC 71 11/28/2022 1148   LDLDIRECT 69.0 06/03/2022 0924   LABVLDL 38 11/28/2022 1148   The 10-year ASCVD risk score (Arnett DK, et al., 2019) is: 0.8%   He restarted fenofibrate 160 MG tablet  omega-3 acid ethyl esters (LOVAZA) 1 g capsule   He denies RUQ pain He estimates  to drink 1 glass wine per week  2. Vitamin D deficiency  Latest Reference Range & Units 12/23/21 08:36 06/08/22 09:23 11/28/22 11:48  Vitamin D, 25-Hydroxy 30.0 - 100.0 ng/mL 18.3 (L) 33.2 38.4  (L): Data is abnormally low  He reports fatigue His youngest child is now 54 months old. He and his wife are both back at work FT  He is on daily OTC Vit D 3 5,000 international units    Assessment/Plan:   1. Elevated triglycerides with high cholesterol Continue  fenofibrate 160 MG tablet  omega-3 acid ethyl esters (LOVAZA) 1 g capsule   2. Vitamin D deficiency Continue OTC Vit D 3 5,000 international units    3. Obesity, Current BMI 34.8 Refill and INCREASE tirzepatide (ZEPBOUND) 10 MG/0.5ML Pen Inject 10 mg into the skin once a week. Dispense: 3 mL, Refills: 0 ordered   Charles Diaz is currently in the action stage of change. As such, his goal is to continue with weight loss efforts. He has agreed to the Category 3 Plan.   Exercise goals: For substantial health benefits, adults should do at least 150 minutes (2 hours and 30 minutes) a week of moderate-intensity, or 75 minutes (1 hour and 15 minutes) a week of vigorous-intensity aerobic physical activity, or an equivalent combination of moderate- and vigorous-intensity aerobic activity. Aerobic activity should be performed in episodes of at least 10 minutes, and preferably, it should be spread  throughout the week.  Behavioral modification strategies: increasing lean protein intake, decreasing simple carbohydrates, increasing vegetables, increasing water intake, no skipping meals, meal planning and cooking strategies, keeping healthy foods in the home, ways to avoid boredom eating, and planning for success.  Charles Diaz has agreed to follow-up with our clinic in 4 weeks. He was informed of the importance of frequent follow-up visits to maximize his success with intensive lifestyle modifications for his multiple health conditions.   Objective:   Blood  pressure 102/68, pulse (!) 54, temperature 97.7 F (36.5 C), height 5\' 8"  (1.727 m), weight 233 lb (105.7 kg), SpO2 99%. Body mass index is 35.43 kg/m.  General: Cooperative, alert, well developed, in no acute distress. HEENT: Conjunctivae and lids unremarkable. Cardiovascular: Regular rhythm.  Lungs: Normal work of breathing. Neurologic: No focal deficits.   Lab Results  Component Value Date   CREATININE 1.12 11/28/2022   BUN 14 11/28/2022   NA 143 11/28/2022   K 4.3 11/28/2022   CL 107 (H) 11/28/2022   CO2 23 11/28/2022   Lab Results  Component Value Date   ALT 20 11/28/2022   AST 17 11/28/2022   ALKPHOS 109 11/28/2022   BILITOT 0.4 11/28/2022   Lab Results  Component Value Date   HGBA1C 5.3 11/28/2022   HGBA1C 5.4 12/23/2021   HGBA1C 5.4 01/27/2020   HGBA1C 5.3 02/23/2018   Lab Results  Component Value Date   INSULIN 16.9 11/28/2022   INSULIN 9.1 12/23/2021   INSULIN 15.5 07/16/2020   INSULIN 15.4 03/16/2020   INSULIN 14.4 01/27/2020   Lab Results  Component Value Date   TSH 2.95 06/03/2022   Lab Results  Component Value Date   CHOL 142 11/28/2022   HDL 33 (L) 11/28/2022   LDLCALC 71 11/28/2022   LDLDIRECT 69.0 06/03/2022   TRIG 228 (H) 11/28/2022   CHOLHDL 4.3 11/28/2022   Lab Results  Component Value Date   VD25OH 38.4 11/28/2022   VD25OH 33.2 06/08/2022   VD25OH 18.3 (L) 12/23/2021   Lab Results  Component Value Date   WBC 6.9 06/03/2022   HGB 14.8 06/03/2022   HCT 43.0 06/03/2022   MCV 85.2 06/03/2022   PLT 235.0 06/03/2022   Lab Results  Component Value Date   IRON 89 10/01/2021   TIBC 357.0 10/01/2021   FERRITIN 72.9 10/01/2021     Attestation Statements:   Reviewed by clinician on day of visit: allergies, medications, problem list, medical history, surgical history, family history, social history, and previous encounter notes.  I have reviewed the above documentation for accuracy and completeness, and I agree with the above. -   Charles Diaz d. Nereyda Bowler, NP-C

## 2023-05-08 ENCOUNTER — Other Ambulatory Visit (HOSPITAL_COMMUNITY): Payer: Self-pay

## 2023-05-08 ENCOUNTER — Ambulatory Visit (INDEPENDENT_AMBULATORY_CARE_PROVIDER_SITE_OTHER): Payer: BC Managed Care – PPO | Admitting: Adult Health

## 2023-05-08 ENCOUNTER — Encounter (INDEPENDENT_AMBULATORY_CARE_PROVIDER_SITE_OTHER): Payer: Self-pay | Admitting: Adult Health

## 2023-05-08 DIAGNOSIS — E669 Obesity, unspecified: Secondary | ICD-10-CM

## 2023-05-08 DIAGNOSIS — Z6834 Body mass index (BMI) 34.0-34.9, adult: Secondary | ICD-10-CM | POA: Diagnosis not present

## 2023-05-08 DIAGNOSIS — E782 Mixed hyperlipidemia: Secondary | ICD-10-CM

## 2023-05-08 DIAGNOSIS — E88819 Insulin resistance, unspecified: Secondary | ICD-10-CM | POA: Diagnosis not present

## 2023-05-08 MED ORDER — TIRZEPATIDE-WEIGHT MANAGEMENT 10 MG/0.5ML ~~LOC~~ SOAJ
10.0000 mg | SUBCUTANEOUS | 0 refills | Status: DC
  Filled 2023-05-08: qty 2, 28d supply, fill #0

## 2023-05-08 MED ORDER — TIRZEPATIDE-WEIGHT MANAGEMENT 10 MG/0.5ML ~~LOC~~ SOAJ: 10.0000 mg | SUBCUTANEOUS | 0 refills | Status: DC

## 2023-05-08 NOTE — Progress Notes (Signed)
WEIGHT SUMMARY AND BIOMETRICS  Vitals Temp: 98.1 F (36.7 C) BP: 112/75 Pulse Rate: 72 SpO2: 95 %   Anthropometric Measurements Height: 5\' 8"  (1.727 m) Weight: 227 lb (103 kg) BMI (Calculated): 34.52 Weight at Last Visit: 233lb Weight Lost Since Last Visit: 6lb Weight Gained Since Last Visit: 0 Starting Weight: 6lb Total Weight Loss (lbs): 31 lb (14.1 kg) Peak Weight: 260lb   Body Composition  Body Fat %: 28.8 % Fat Mass (lbs): 65.4 lbs Muscle Mass (lbs): 153.6 lbs Total Body Water (lbs): 110.2 lbs Visceral Fat Rating : 14   Other Clinical Data Fasting: no Labs: no Today's Visit #: 17 Starting Date: 12/23/21    Chief Complaint:   OBESITY Charles Diaz is here to discuss his progress with his obesity treatment plan. He is on the the Category 3 Plan and states he is following his eating plan approximately 70 % of the time. He states he is exercising Cardio/Strength 60/Soccer/Pickleball 60 minutes 1/1/1 times per week.   Interim History:  Initial Start with [facility] 01/27/2020 Restarted [facility] 12/23/2021   08/02/2022- started on Zepbound therapy He has titrated up to Zepbound 10mg  once weekly injection  Hydration-1 cup of coffee, he estimates to drink 3-4 water/day  Reviewed Bioimpedance results with pt: Muscle Mass: -5.8 lbs Adipose Mass: -4.6 lbs  Subjective:   1. Insulin resistance  Latest Reference Range & Units 07/16/20 09:52 12/23/21 08:36 11/28/22 11:48  INSULIN 2.6 - 24.9 uIU/mL 15.5 9.1 16.9   08/02/2022- started on Zepbound therapy He has titrated up to Zepbound 10mg  once weekly injection Denies mass in neck, dysphagia, dyspepsia, persistent hoarseness, abdominal pain, or N/V/C   2. Elevated TG with high Chol Levels  Latest Reference Range & Units 10/01/21 10:39 12/23/21 08:36 06/03/22 09:24 11/28/22 11:48  Triglycerides 0 - 149 mg/dL 308.6 (H) 5,784 (HH) 696.2 (H) 228 (H)  (HH): Data is critically high (H): Data is abnormally high  He denies RUQ  pain He denies N/V He reports very infrequent alcohol use  Assessment/Plan:   1. Insulin resistance Cat 3 meal plan, regular exercise, and weekly Zepbound therapy  2. Elevated TG with high Chol Levels Cat 3 meal plan, regular exercise, daily Lovaza, and weekly Zepbound therapy  3. Morbid obesity (HCC), starting BMI 39.23 with co-morbidities  Charles Diaz is currently in the action stage of change. As such, his goal is to continue with weight loss efforts. He has agreed to the Category 3 Plan.   Exercise goals: For substantial health benefits, adults should do at least 150 minutes (2 hours and 30 minutes) a week of moderate-intensity, or 75 minutes (1 hour and 15 minutes) a week of vigorous-intensity aerobic physical activity, or an equivalent combination of moderate- and vigorous-intensity aerobic activity. Aerobic activity should be performed in episodes of at least 10 minutes, and preferably, it should be spread throughout the week.  Behavioral modification strategies: increasing lean protein intake, decreasing simple carbohydrates, increasing vegetables, increasing water intake, meal planning and cooking strategies, keeping healthy foods in the home, ways to avoid boredom eating, and planning for success.  Charles Diaz has agreed to follow-up with our clinic in 4 weeks. He was informed of the importance of frequent follow-up visits to maximize his success with intensive lifestyle modifications for his multiple health conditions.   Check Fasting Labs at next OV  Objective:   Blood pressure 112/75, pulse 72, temperature 98.1 F (36.7 C), height 5\' 8"  (1.727 m), weight 227 lb (103 kg), SpO2 95%. Body mass  index is 34.52 kg/m.  General: Cooperative, alert, well developed, in no acute distress. HEENT: Conjunctivae and lids unremarkable. Cardiovascular: Regular rhythm.  Lungs: Normal work of breathing. Neurologic: No focal deficits.   Lab Results  Component Value Date   CREATININE 1.12 11/28/2022    BUN 14 11/28/2022   NA 143 11/28/2022   K 4.3 11/28/2022   CL 107 (H) 11/28/2022   CO2 23 11/28/2022   Lab Results  Component Value Date   ALT 20 11/28/2022   AST 17 11/28/2022   ALKPHOS 109 11/28/2022   BILITOT 0.4 11/28/2022   Lab Results  Component Value Date   HGBA1C 5.3 11/28/2022   HGBA1C 5.4 12/23/2021   HGBA1C 5.4 01/27/2020   HGBA1C 5.3 02/23/2018   Lab Results  Component Value Date   INSULIN 16.9 11/28/2022   INSULIN 9.1 12/23/2021   INSULIN 15.5 07/16/2020   INSULIN 15.4 03/16/2020   INSULIN 14.4 01/27/2020   Lab Results  Component Value Date   TSH 2.95 06/03/2022   Lab Results  Component Value Date   CHOL 142 11/28/2022   HDL 33 (L) 11/28/2022   LDLCALC 71 11/28/2022   LDLDIRECT 69.0 06/03/2022   TRIG 228 (H) 11/28/2022   CHOLHDL 4.3 11/28/2022   Lab Results  Component Value Date   VD25OH 38.4 11/28/2022   VD25OH 33.2 06/08/2022   VD25OH 18.3 (L) 12/23/2021   Lab Results  Component Value Date   WBC 6.9 06/03/2022   HGB 14.8 06/03/2022   HCT 43.0 06/03/2022   MCV 85.2 06/03/2022   PLT 235.0 06/03/2022   Lab Results  Component Value Date   IRON 89 10/01/2021   TIBC 357.0 10/01/2021   FERRITIN 72.9 10/01/2021    Attestation Statements:   Reviewed by clinician on day of visit: allergies, medications, problem list, medical history, surgical history, family history, social history, and previous encounter notes.  I have reviewed the above documentation for accuracy and completeness, and I agree with the above. -  Marlissa Emerick d. Chakira Jachim, NP-C

## 2023-05-09 ENCOUNTER — Other Ambulatory Visit (HOSPITAL_COMMUNITY): Payer: Self-pay

## 2023-06-01 ENCOUNTER — Ambulatory Visit (INDEPENDENT_AMBULATORY_CARE_PROVIDER_SITE_OTHER): Payer: BC Managed Care – PPO | Admitting: Family Medicine

## 2023-06-01 ENCOUNTER — Other Ambulatory Visit (HOSPITAL_COMMUNITY): Payer: Self-pay

## 2023-06-01 ENCOUNTER — Encounter (INDEPENDENT_AMBULATORY_CARE_PROVIDER_SITE_OTHER): Payer: Self-pay | Admitting: Family Medicine

## 2023-06-01 VITALS — BP 97/66 | HR 78 | Temp 97.7°F | Ht 68.0 in | Wt 222.0 lb

## 2023-06-01 DIAGNOSIS — E559 Vitamin D deficiency, unspecified: Secondary | ICD-10-CM | POA: Diagnosis not present

## 2023-06-01 DIAGNOSIS — E88819 Insulin resistance, unspecified: Secondary | ICD-10-CM | POA: Diagnosis not present

## 2023-06-01 DIAGNOSIS — E669 Obesity, unspecified: Secondary | ICD-10-CM

## 2023-06-01 DIAGNOSIS — Z6833 Body mass index (BMI) 33.0-33.9, adult: Secondary | ICD-10-CM

## 2023-06-01 DIAGNOSIS — E782 Mixed hyperlipidemia: Secondary | ICD-10-CM | POA: Diagnosis not present

## 2023-06-01 DIAGNOSIS — K760 Fatty (change of) liver, not elsewhere classified: Secondary | ICD-10-CM

## 2023-06-01 MED ORDER — TIRZEPATIDE-WEIGHT MANAGEMENT 10 MG/0.5ML ~~LOC~~ SOAJ
10.0000 mg | SUBCUTANEOUS | 0 refills | Status: DC
  Filled 2023-06-01 – 2023-06-02 (×4): qty 2, 28d supply, fill #0

## 2023-06-01 NOTE — Progress Notes (Incomplete)
Charles Diaz, D.O.  ABFM, ABOM Specializing in Clinical Bariatric Medicine  Office located at: [address] [place], [place]  [address]   Assessment and Plan:   FOR THE DISEASE OF OBESITY:  Since last office visit on 05/08/23 patient's  Muscle mass has decreased by 4.8 lb. Fat mass has increased by 2 lb. Total body water has decreased by 2.8 lb.  Counseling done on how various foods will affect these numbers and how to maximize success  Total lbs lost to date: 36 lbs  Total weight loss percentage to date: 13.95%    Recommended Dietary Goals Charles Diaz is currently in the action stage of change. As such, his goal is to continue weight management plan.  He has agreed to: continue current plan   Behavioral Intervention We discussed the following today: increasing lean protein intake to established goals and increasing water intake   Additional resources provided today: None  Evidence-based interventions for health behavior change were utilized today including the discussion of self monitoring techniques, problem-solving barriers and SMART goal setting techniques.   Regarding patient's less desirable eating habits and patterns, we employed the technique of small changes.   Pt will specifically work on: n/a    Recommended Physical Activity Goals Charles Diaz has been advised to work up to 150 minutes of moderate intensity aerobic activity a week and strengthening exercises 2-3 times per week for cardiovascular health, weight loss maintenance and preservation of muscle mass.   He has agreed to : Continue current level of physical activity    Pharmacotherapy We both agreed to : continue current anti-obesity medication regimen   FOR ASSOCIATED CONDITIONS ADDRESSED TODAY:  Obesity (BMI 30-39.9), Current 33.76 Morbid obesity (HCC), starting BMI 39.23 with co-morbidities Assessment & Plan: He is compliant and tolerant of Zepbound 10 mg once a week. Pt will continue with weight  loss therapy. We discussed the importance of increasing lean protein intake to established goals given his decrease in 5 lbs of muscle mass since LOV.   Orders: -     Tirzepatide-Weight Management; Inject 10 mg into the skin once a week.  Dispense: 2 mL; Refill: 0   Insulin resistance Assessment & Plan: Will recheck labs today. Continue Cat 3 meal plan and regular exercise Continue Zepbound therapy as directed.   Orders: -     Insulin, random -     Hemoglobin A1c   Vitamin D deficiency Assessment & Plan: He is currently on OTC Cholecalciferol 5,000 units daily. Continue with current supplementation regiment and weight loss efforts.   Orders: -     VITAMIN D 25 Hydroxy (Vit-D Deficiency, Fractures)   Elevated triglycerides with high cholesterol Assessment & Plan: Most recent Lipid panel:  Lab Results  Component Value Date   CHOL 142 11/28/2022   HDL 33 (L) 11/28/2022   LDLCALC 71 11/28/2022   LDLDIRECT 69.0 06/03/2022   TRIG 228 (H) 11/28/2022   CHOLHDL 4.3 11/28/2022   Condition treated with Fenofibrate and Lovaza. Will recheck lipid panel today as part of routine screening. Pt agrees to continue with meds and/our treatment plan of a heart-heathy, low cholesterol meal plan. We recommend: aerobic activity with eventual goal of a minimum of 150+ min wk plus 2 days/ week of resistance or strength training.    Orders: -     Lipid panel -     Comprehensive metabolic panel   Hepatic steatosis Assessment & Plan: In the past his liver enzymes were elevated, however the last  couple of labs resulted in lower levels of ALT. Pt is aymptomatic and has no concerns. We will obtain labs today. Continue fat loss via meal plan and exercise to eventual goal of Body Fat % < 25   Orders: -     Comprehensive metabolic panel  Follow up:   Return 07/03/23. He was informed of the importance of frequent follow up visits to maximize his success with intensive lifestyle modifications for his  multiple health conditions.  Charles Diaz is aware that we will review all of his lab results at our next visit.  He is aware that if anything is critical/ life threatening with the results, we will be contacting him via MyChart prior to the office visit to discuss management.    Subjective:   Chief complaint: Obesity Charles Diaz is here to discuss his progress with his obesity treatment plan. He is on the the Category 3 Plan and states he is following his eating plan approximately 75% of the time. He states he is walking 30 minutes 2-3 days per week.  Interval History:  Charles Diaz is here for a follow up office visit. First encounter with pt; he typically sees [name]. Since last OV, Charles Diaz is down 5 lbs. He was sick for a few days after Thanksgiving with symptoms of sinus pressure and fatigue. During this time period, he reports not eating much. Symptoms have resolved.   Typically for breakfast, he has 2 eggs, a slice of cheese, yogurt, and light bread. For lunch, he has a sandwich with yogurt, fruit, and a sandwich with 4 ounces of meat. For dinner, he has  4 ounces of chicken with rice and beans. He typically drinks 50-60 ounces of water daily.   Pharmacotherapy for weight loss: He is currently taking  Zepbound 10 mg once a week  Review of Systems:  Pertinent positives were addressed with patient today.  Reviewed by clinician on day of visit: allergies, medications, problem list, medical history, surgical history, family history, social history, and previous encounter notes.  Weight Summary and Biometrics   Weight Lost Since Last Visit: 5 lb  Weight Gained Since Last Visit: 0  Vitals Temp: 97.7 F (36.5 C) BP: 97/66 Pulse Rate: 78 SpO2: 98 %   Anthropometric Measurements Height: 5\' 8"  (1.727 m) Weight: 222 lb (100.7 kg) BMI (Calculated): 33.76 Weight at Last Visit: 227 lb Weight Lost Since Last Visit: 5 lb Weight Gained Since Last Visit: 0 Starting Weight: 258  lb Peak Weight: 260 lb   Body Composition  Body Fat %: 29.5 % Fat Mass (lbs): 65.6 lbs Muscle Mass (lbs): 148.8 lbs Total Body Water (lbs): 107.4 lbs Visceral Fat Rating : 14   Other Clinical Data Fasting: Yes Labs: Yes Today's Visit #: 18 Starting Date: 12/23/21   Objective:   PHYSICAL EXAM: Blood pressure 97/66, pulse 78, temperature 97.7 F (36.5 C), height 5\' 8"  (1.727 m), weight 222 lb (100.7 kg), SpO2 98%. Body mass index is 33.75 kg/m.  General: he is overweight, cooperative and in no acute distress. PSYCH: Has normal mood, affect and thought process.   HEENT: EOMI, sclerae are anicteric. Lungs: Normal breathing effort, no conversational dyspnea. Extremities: Moves * 4 Neurologic: A and O * 3, good insight  DIAGNOSTIC DATA REVIEWED: BMET    Component Value Date/Time   NA 143 11/28/2022 1148   K 4.3 11/28/2022 1148   CL 107 (H) 11/28/2022 1148   CO2 23 11/28/2022 1148   GLUCOSE 88 11/28/2022 1148  GLUCOSE 88 05/26/2021 1119   BUN 14 11/28/2022 1148   CREATININE 1.12 11/28/2022 1148   CALCIUM 9.5 11/28/2022 1148   GFRNONAA 80 07/16/2020 0952   GFRAA 93 07/16/2020 0952   Lab Results  Component Value Date   HGBA1C 5.3 11/28/2022   HGBA1C 5.3 02/23/2018   Lab Results  Component Value Date   INSULIN 16.9 11/28/2022   INSULIN 14.4 01/27/2020   Lab Results  Component Value Date   TSH 2.95 06/03/2022   CBC    Component Value Date/Time   WBC 6.9 06/03/2022 0924   RBC 5.05 06/03/2022 0924   HGB 14.8 06/03/2022 0924   HCT 43.0 06/03/2022 0924   PLT 235.0 06/03/2022 0924   MCV 85.2 06/03/2022 0924   MCH 28.8 12/16/2018 1741   MCHC 34.4 06/03/2022 0924   RDW 13.4 06/03/2022 0924   Iron Studies    Component Value Date/Time   IRON 89 10/01/2021 1039   TIBC 357.0 10/01/2021 1039   FERRITIN 72.9 10/01/2021 1039   IRONPCTSAT 24.9 10/01/2021 1039   Lipid Panel     Component Value Date/Time   CHOL 142 11/28/2022 1148   TRIG 228 (H)  11/28/2022 1148   HDL 33 (L) 11/28/2022 1148   CHOLHDL 4.3 11/28/2022 1148   CHOLHDL 5 06/03/2022 0924   VLDL 55.8 (H) 06/03/2022 0924   LDLCALC 71 11/28/2022 1148   LDLDIRECT 69.0 06/03/2022 0924   Hepatic Function Panel     Component Value Date/Time   PROT 7.1 11/28/2022 1148   ALBUMIN 4.6 11/28/2022 1148   AST 17 11/28/2022 1148   ALT 20 11/28/2022 1148   ALKPHOS 109 11/28/2022 1148   BILITOT 0.4 11/28/2022 1148   BILIDIR 0.1 06/03/2022 0924      Component Value Date/Time   TSH 2.95 06/03/2022 0924   Nutritional Lab Results  Component Value Date   VD25OH 38.4 11/28/2022   VD25OH 33.2 06/08/2022   VD25OH 18.3 (L) 12/23/2021    Attestations:   I, Special Puri, acting as a Stage manager for Thomasene Lot, DO., have compiled all relevant documentation for today's office visit on behalf of Thomasene Lot, DO, while in the presence of [employer], DO.  I have reviewed the above documentation for accuracy and completeness, and I agree with the above. Charles Diaz, D.O.  The 21st Century Cures Act was signed into law in 2016 which includes the topic of electronic health records.  This provides immediate access to information in MyChart.  This includes consultation notes, operative notes, office notes, lab results and pathology reports.  If you have any questions about what you read please let us know at your next visit so we can discuss your concerns and take corrective action if need be.  We are right here with you.

## 2023-06-02 ENCOUNTER — Other Ambulatory Visit (HOSPITAL_COMMUNITY): Payer: Self-pay

## 2023-06-02 LAB — LIPID PANEL
Chol/HDL Ratio: 4.1 {ratio} (ref 0.0–5.0)
Cholesterol, Total: 136 mg/dL (ref 100–199)
HDL: 33 mg/dL — ABNORMAL LOW (ref >39)
LDL Chol Calc (NIH): 77 mg/dL (ref 0–99)
Triglycerides: 149 mg/dL (ref 0–149)
VLDL Cholesterol Cal: 26 mg/dL (ref 5–40)

## 2023-06-02 LAB — COMPREHENSIVE METABOLIC PANEL
ALT: 25 [IU]/L (ref 0–44)
AST: 21 [IU]/L (ref 0–40)
Albumin: 4.6 g/dL (ref 4.1–5.1)
Alkaline Phosphatase: 109 [IU]/L (ref 44–121)
BUN/Creatinine Ratio: 12 (ref 9–20)
BUN: 14 mg/dL (ref 6–24)
Bilirubin Total: 0.5 mg/dL (ref 0.0–1.2)
CO2: 22 mmol/L (ref 20–29)
Calcium: 9.7 mg/dL (ref 8.7–10.2)
Chloride: 105 mmol/L (ref 96–106)
Creatinine, Ser: 1.16 mg/dL (ref 0.76–1.27)
Globulin, Total: 2.7 g/dL (ref 1.5–4.5)
Glucose: 87 mg/dL (ref 70–99)
Potassium: 4.3 mmol/L (ref 3.5–5.2)
Sodium: 142 mmol/L (ref 134–144)
Total Protein: 7.3 g/dL (ref 6.0–8.5)
eGFR: 81 mL/min/{1.73_m2} (ref >59)

## 2023-06-02 LAB — VITAMIN D 25 HYDROXY (VIT D DEFICIENCY, FRACTURES): Vit D, 25-Hydroxy: 31.3 ng/mL (ref 30.0–100.0)

## 2023-06-02 LAB — HEMOGLOBIN A1C
Est. average glucose Bld gHb Est-mCnc: 108 mg/dL
Hgb A1c MFr Bld: 5.4 % (ref 4.8–5.6)

## 2023-06-02 LAB — INSULIN, RANDOM: INSULIN: 12.6 u[IU]/mL (ref 2.6–24.9)

## 2023-06-06 ENCOUNTER — Encounter: Payer: BC Managed Care – PPO | Admitting: Internal Medicine

## 2023-07-03 ENCOUNTER — Encounter (INDEPENDENT_AMBULATORY_CARE_PROVIDER_SITE_OTHER): Payer: Self-pay | Admitting: Adult Health

## 2023-07-03 ENCOUNTER — Other Ambulatory Visit (HOSPITAL_COMMUNITY): Payer: Self-pay

## 2023-07-03 ENCOUNTER — Ambulatory Visit (INDEPENDENT_AMBULATORY_CARE_PROVIDER_SITE_OTHER): Payer: BC Managed Care – PPO | Admitting: Adult Health

## 2023-07-03 VITALS — BP 100/70 | HR 97 | Temp 97.6°F | Ht 68.0 in | Wt 224.0 lb

## 2023-07-03 DIAGNOSIS — E559 Vitamin D deficiency, unspecified: Secondary | ICD-10-CM | POA: Diagnosis not present

## 2023-07-03 DIAGNOSIS — Z6834 Body mass index (BMI) 34.0-34.9, adult: Secondary | ICD-10-CM

## 2023-07-03 DIAGNOSIS — E88819 Insulin resistance, unspecified: Secondary | ICD-10-CM | POA: Diagnosis not present

## 2023-07-03 DIAGNOSIS — E782 Mixed hyperlipidemia: Secondary | ICD-10-CM

## 2023-07-03 DIAGNOSIS — K760 Fatty (change of) liver, not elsewhere classified: Secondary | ICD-10-CM | POA: Diagnosis not present

## 2023-07-03 DIAGNOSIS — E669 Obesity, unspecified: Secondary | ICD-10-CM

## 2023-07-03 MED ORDER — TIRZEPATIDE-WEIGHT MANAGEMENT 10 MG/0.5ML ~~LOC~~ SOAJ
10.0000 mg | SUBCUTANEOUS | 0 refills | Status: DC
  Filled 2023-07-03: qty 2, 28d supply, fill #0

## 2023-07-03 MED ORDER — VITAMIN D (ERGOCALCIFEROL) 1.25 MG (50000 UNIT) PO CAPS
50000.0000 [IU] | ORAL_CAPSULE | ORAL | 0 refills | Status: DC
  Filled 2023-07-03: qty 4, 28d supply, fill #0

## 2023-07-03 NOTE — Progress Notes (Signed)
 WEIGHT SUMMARY AND BIOMETRICS  Vitals Temp: 97.6 F (36.4 C) BP: 100/70 Pulse Rate: 97 SpO2: 96 %   Anthropometric Measurements Height: 5' 8 (1.727 m) Weight: 224 lb (101.6 kg) BMI (Calculated): 34.07 Weight at Last Visit: 222lb Weight Lost Since Last Visit: 0 Weight Gained Since Last Visit: 2lb Starting Weight: 258lb Total Weight Loss (lbs): 34 lb (15.4 kg) Peak Weight: 260lb   Body Composition  Body Fat %: 29.3 % Fat Mass (lbs): 65.6 lbs Muscle Mass (lbs): 150.6 lbs Total Body Water (lbs): 108.2 lbs Visceral Fat Rating : 14   Other Clinical Data Fasting: no Labs: no Today's Visit #: 19 Starting Date: 12/23/21    Chief Complaint:   OBESITY Charles Diaz is here to discuss his progress with his obesity treatment plan. He is on the the Category 3 Plan and states he is following his eating plan approximately 50 % of the time.  He states he is exercising : Walking 30 minutes 3 times per week.   Interim History:  He and his family travelled to [place] for 3 week vacation over holidays. He endorses celebration eating and frequent walking.  Reviewed Bioimpedance results with pt: Muscle Mass:+1.8 lbs Adipose Mass: No Change  Current weight 224 lbs with corresponding BMI 34.1 To achieve BMI < 30: Weight 195 lbs with corresponding BMI 29.6 His current muscle mass: 150.6 lbs  2025 Health Goals:] 1) Wednesday Exercise Classes at [facility]- RESTART 2) Play Soccer on weekends 3) Increase overall daily activity levels  Subjective:   1. Insulin  resistance Discussed Labs  Latest Reference Range & Units 11/28/22 11:48 06/01/23 14:49  INSULIN  2.6 - 24.9 uIU/mL 16.9 12.6   Lab Results  Component Value Date   HGBA1C 5.4 06/01/2023   HGBA1C 5.3 11/28/2022   HGBA1C 5.4 12/23/2021    Insulin  Level improved with stable A1c  2. Hepatic steatosis Discussed Labs  Latest Reference Range & Units 06/01/23 14:49  Alkaline Phosphatase 44 - 121 IU/L 109  Albumin 4.1 -  5.1 g/dL 4.6  AST 0 - 40 IU/L 21  ALT 0 - 44 IU/L 25   He denies RUQ pain Levels normal  3. Elevated triglycerides with high cholesterol Discussed Labs Lipid Panel     Component Value Date/Time   CHOL 136 06/01/2023 1449   TRIG 149 06/01/2023 1449   HDL 33 (L) 06/01/2023 1449   CHOLHDL 4.1 06/01/2023 1449   CHOLHDL 5 06/03/2022 0924   VLDL 55.8 (H) 06/03/2022 0924   LDLCALC 77 06/01/2023 1449   LDLDIRECT 69.0 06/03/2022 0924   LABVLDL 26 06/01/2023 1449   The 10-year ASCVD risk score (Arnett DK, et al., 2019) is: 0.7%   Values used to calculate the score:     Age: 43 years     Sex: Male     Is Non-Hispanic African American: No     Diabetic: No     Tobacco smoker: No     Systolic Blood Pressure: 100 mmHg     Is BP treated: No     HDL Cholesterol: 33 mg/dL     Total Cholesterol: 136 mg/dL   BEST LIPID PANEL TO DATE! He is on  fenofibrate  160 MG tablet  omega-3 acid ethyl esters (LOVAZA ) 1 g capsule   4. Vit D Def  Latest Reference Range & Units 06/01/23 14:49  Vitamin D , 25-Hydroxy 30.0 - 100.0 ng/mL 31.3   Level well below goal of 50-70 He is on daily OTC Vit D3 5,000  Assessment/Plan:  1. Insulin  resistance (Primary) Continue healthy eating, increase regular exercise Continue weekly Zepbound  therapy  2. Hepatic steatosis Continue with weight loss efforts Continue to avoid Hepatoxic substances  3. Elevated triglycerides with high cholesterol Continue  fenofibrate  160 MG tablet  omega-3 acid ethyl esters (LOVAZA ) 1 g capsule   4. Vit D Def Stop OTC Vit D3 5,000 Start  Vitamin D , Ergocalciferol , (DRISDOL ) 1.25 MG (50000 UNIT) CAPS capsule Take 1 capsule (50,000 Units total) by mouth every 7 (seven) days. Dispense: 4 capsule, Refills: 0 of 0 remaining   5. Obesity (BMI 30-39.9), Current 34.07 tirzepatide  (ZEPBOUND ) 10 MG/0.5ML Pen Inject 10 mg into the skin once a week. Dispense: 2 mL, Refills: 0 of 0 remaining   Charles Diaz is currently in the action stage  of change. As such, his goal is to continue with weight loss efforts. He has agreed to the Category 3 Plan.   Exercise goals: For substantial health benefits, adults should do at least 150 minutes (2 hours and 30 minutes) a week of moderate-intensity, or 75 minutes (1 hour and 15 minutes) a week of vigorous-intensity aerobic physical activity, or an equivalent combination of moderate- and vigorous-intensity aerobic activity. Aerobic activity should be performed in episodes of at least 10 minutes, and preferably, it should be spread throughout the week.  Behavioral modification strategies: increasing lean protein intake, decreasing simple carbohydrates, increasing vegetables, increasing water intake, no skipping meals, meal planning and cooking strategies, keeping healthy foods in the home, ways to avoid boredom eating, and planning for success.  Charles Diaz has agreed to follow-up with our clinic in 4 weeks. He was informed of the importance of frequent follow-up visits to maximize his success with intensive lifestyle modifications for his multiple health conditions.   Objective:   Blood pressure 100/70, pulse 97, temperature 97.6 F (36.4 C), height 5' 8 (1.727 m), weight 224 lb (101.6 kg), SpO2 96%. Body mass index is 34.06 kg/m.  General: Cooperative, alert, well developed, in no acute distress. HEENT: Conjunctivae and lids unremarkable. Cardiovascular: Regular rhythm.  Lungs: Normal work of breathing. Neurologic: No focal deficits.   Lab Results  Component Value Date   CREATININE 1.16 06/01/2023   BUN 14 06/01/2023   NA 142 06/01/2023   K 4.3 06/01/2023   CL 105 06/01/2023   CO2 22 06/01/2023   Lab Results  Component Value Date   ALT 25 06/01/2023   AST 21 06/01/2023   ALKPHOS 109 06/01/2023   BILITOT 0.5 06/01/2023   Lab Results  Component Value Date   HGBA1C 5.4 06/01/2023   HGBA1C 5.3 11/28/2022   HGBA1C 5.4 12/23/2021   HGBA1C 5.4 01/27/2020   HGBA1C 5.3 02/23/2018    Lab Results  Component Value Date   INSULIN  12.6 06/01/2023   INSULIN  16.9 11/28/2022   INSULIN  9.1 12/23/2021   INSULIN  15.5 07/16/2020   INSULIN  15.4 03/16/2020   Lab Results  Component Value Date   TSH 2.95 06/03/2022   Lab Results  Component Value Date   CHOL 136 06/01/2023   HDL 33 (L) 06/01/2023   LDLCALC 77 06/01/2023   LDLDIRECT 69.0 06/03/2022   TRIG 149 06/01/2023   CHOLHDL 4.1 06/01/2023   Lab Results  Component Value Date   VD25OH 31.3 06/01/2023   VD25OH 38.4 11/28/2022   VD25OH 33.2 06/08/2022   Lab Results  Component Value Date   WBC 6.9 06/03/2022   HGB 14.8 06/03/2022   HCT 43.0 06/03/2022   MCV 85.2 06/03/2022   PLT 235.0 06/03/2022  Lab Results  Component Value Date   IRON 89 10/01/2021   TIBC 357.0 10/01/2021   FERRITIN 72.9 10/01/2021    Attestation Statements:   Reviewed by clinician on day of visit: allergies, medications, problem list, medical history, surgical history, family history, social history, and previous encounter notes.  I have reviewed the above documentation for accuracy and completeness, and I agree with the above. -  Lyrik Buresh d. Giuliano Preece, NP-C

## 2023-07-17 ENCOUNTER — Encounter: Payer: Self-pay | Admitting: Internal Medicine

## 2023-07-17 ENCOUNTER — Ambulatory Visit (INDEPENDENT_AMBULATORY_CARE_PROVIDER_SITE_OTHER): Payer: BC Managed Care – PPO | Admitting: Internal Medicine

## 2023-07-17 VITALS — BP 116/78 | HR 78 | Temp 97.6°F | Resp 12 | Ht 68.0 in | Wt 226.6 lb

## 2023-07-17 DIAGNOSIS — Z0000 Encounter for general adult medical examination without abnormal findings: Secondary | ICD-10-CM | POA: Diagnosis not present

## 2023-07-17 DIAGNOSIS — Z09 Encounter for follow-up examination after completed treatment for conditions other than malignant neoplasm: Secondary | ICD-10-CM

## 2023-07-17 DIAGNOSIS — Z23 Encounter for immunization: Secondary | ICD-10-CM

## 2023-07-17 NOTE — Progress Notes (Unsigned)
Subjective:    Patient ID: Charles Diaz, male    DOB: [birth year], 44 y.o.   MRN: [id number]  DOS:  07/17/2023 Type of visit - description: cpx  Here for CPX. Doing well, has no concerns.  Review of Systems  A 14 point review of systems is negative    Past Medical History:  Diagnosis Date   Dyslipidemia    Fatty liver    High cholesterol    High triglycerides    Lactose intolerance    Vitamin D deficiency     Past Surgical History:  Procedure Laterality Date   KNEE ARTHROSCOPY Right 2009   Social History   Socioeconomic History   Marital status: Married    Spouse name: [email or web address]   Number of children: 1   Years of education: Not on file   Highest education level: Not on file  Occupational History   Occupation: Advertising account planner, Technical brewer  Tobacco Use   Smoking status: Former    Current packs/day: 0.00    Types: Cigarettes    Quit date: 03/26/2018    Years since quitting: 5.3   Smokeless tobacco: Never   Tobacco comments:       Substance and Sexual Activity   Alcohol use: Yes    Comment: socially   Drug use: No   Sexual activity: Not on file  Other Topics Concern   Not on file  Social History Narrative   Married    Household: pt, wife , child (2017)   Parents from [place]   Social Drivers of Corporate investment banker Strain: Not on file  Food Insecurity: Not on file  Transportation Needs: Not on file  Physical Activity: Not on file  Stress: Not on file  Social Connections: Not on file  Intimate Partner Violence: Not on file     Current Outpatient Medications  Medication Instructions   fenofibrate 160 mg, Oral, Daily   Multiple Vitamin (MULTI-VITAMIN DAILY PO) Take by mouth.   omega-3 acid ethyl esters (LOVAZA) 1 g capsule TAKE 2 CAPSULE BY MOUTH TWICE DAILY   Vitamin D (Ergocalciferol) (DRISDOL) 50,000 Units, Oral, Every 7 days   Zepbound 10 mg, Subcutaneous, Weekly       Objective:   Physical Exam BP 116/78 (BP Location: Right Arm, Cuff  Size: Normal)   Pulse 78   Temp 97.6 F (36.4 C) (Oral)   Resp 12   Ht 5\' 8"  (1.727 m)   Wt 226 lb 9.6 oz (102.8 kg)   SpO2 97%   BMI 34.45 kg/m  General: Well developed, NAD, BMI noted Neck: No  thyromegaly  HEENT:  Normocephalic . Face symmetric, atraumatic Lungs:  CTA B Normal respiratory effort, no intercostal retractions, no accessory muscle use. Heart: RRR,  no murmur.  Abdomen:  Not distended, soft, non-tender. No rebound or rigidity.   Lower extremities: no pretibial edema bilaterally  Skin: Exposed areas without rash. Not pale. Not jaundice Neurologic:  alert & oriented X3.  Speech normal, gait appropriate for age and unassisted Strength symmetric and appropriate for age.  Psych: Cognition and judgment appear intact.  Cooperative with normal attention span and concentration.  Behavior appropriate. No anxious or depressed appearing.     Assessment    Assessment Dyslipidemia , high TG L arm skin lesion, s/p Bx per derm before, no dx made, as off 8-17 decreasing in size per pt  Increase LFTs  -2018: Hep B and C serology (-)   - 2023: Ferritin, alpha-1 antitrypsin, hep B  and C serology negative - [place] December 2023: Fatty liver   PLAN: Here for CPX -Tdap 07/17/2023 - Rec flu and a COVID booster   -CCS, prostate cancer screening: No family history.  No symptoms. -Labs: Reviewed, all okay -Diet and exercise: Doing great, follow-up by the wellness clinic. Also talk about chronic medical problems Obesity: For the last year has attended the wellness center, on Zepbound, doing great with diet and has increased his exercise. Weight December 2023: 255 pounds, BMI 38.8.  Current weight 226 pounds, BMI 34. Dyslipidemia, high triglycerides: Last FLP very good, on fenofibrate, Lovaza. Snoring: He has decreased since he lost weight, denies fatigue. RTC 1 year

## 2023-07-17 NOTE — Patient Instructions (Signed)
It was good to see you, I am glad you are doing well.  Next visit with me for physical exam in 1 year     Please schedule it at the front desk

## 2023-07-18 ENCOUNTER — Encounter: Payer: Self-pay | Admitting: Internal Medicine

## 2023-07-18 NOTE — Assessment & Plan Note (Signed)
Here for CPX -Tdap 07/17/2023 - Rec flu and a COVID booster   -CCS, prostate cancer screening: No family history.  No symptoms. -Labs: Reviewed, all okay -Diet and exercise: Doing great, follow-up by the wellness clinic.

## 2023-07-18 NOTE — Assessment & Plan Note (Signed)
Here for CPX   Also talk about chronic medical problems Obesity: For the last year has attended the wellness center, on Zepbound, doing great with diet and has increased his exercise. Weight December 2023: 255 pounds, BMI 38.8.  Current weight 226 pounds, BMI 34. Dyslipidemia, high triglycerides: Last FLP very good, on fenofibrate, Lovaza. Snoring: He has decreased since he lost weight, denies fatigue. RTC 1 year

## 2023-07-31 ENCOUNTER — Encounter (INDEPENDENT_AMBULATORY_CARE_PROVIDER_SITE_OTHER): Payer: Self-pay | Admitting: Adult Health

## 2023-07-31 ENCOUNTER — Other Ambulatory Visit (HOSPITAL_COMMUNITY): Payer: Self-pay

## 2023-07-31 ENCOUNTER — Ambulatory Visit (INDEPENDENT_AMBULATORY_CARE_PROVIDER_SITE_OTHER): Payer: BC Managed Care – PPO | Admitting: Adult Health

## 2023-07-31 ENCOUNTER — Other Ambulatory Visit (HOSPITAL_BASED_OUTPATIENT_CLINIC_OR_DEPARTMENT_OTHER): Payer: Self-pay

## 2023-07-31 VITALS — BP 99/64 | HR 85 | Temp 97.9°F | Ht 68.0 in | Wt 226.0 lb

## 2023-07-31 DIAGNOSIS — K760 Fatty (change of) liver, not elsewhere classified: Secondary | ICD-10-CM

## 2023-07-31 DIAGNOSIS — E88819 Insulin resistance, unspecified: Secondary | ICD-10-CM | POA: Diagnosis not present

## 2023-07-31 DIAGNOSIS — E559 Vitamin D deficiency, unspecified: Secondary | ICD-10-CM | POA: Diagnosis not present

## 2023-07-31 DIAGNOSIS — E669 Obesity, unspecified: Secondary | ICD-10-CM

## 2023-07-31 DIAGNOSIS — Z6834 Body mass index (BMI) 34.0-34.9, adult: Secondary | ICD-10-CM

## 2023-07-31 MED ORDER — TIRZEPATIDE-WEIGHT MANAGEMENT 10 MG/0.5ML ~~LOC~~ SOAJ
10.0000 mg | SUBCUTANEOUS | 0 refills | Status: DC
  Filled 2023-07-31: qty 2, 28d supply, fill #0

## 2023-07-31 MED ORDER — VITAMIN D (ERGOCALCIFEROL) 1.25 MG (50000 UNIT) PO CAPS
50000.0000 [IU] | ORAL_CAPSULE | ORAL | 0 refills | Status: DC
  Filled 2023-07-31: qty 4, 28d supply, fill #0

## 2023-07-31 NOTE — Progress Notes (Signed)
 WEIGHT SUMMARY AND BIOMETRICS  Vitals Temp: 97.9 F (36.6 C) BP: 99/64 Pulse Rate: 85 SpO2: 95 %   Anthropometric Measurements Height: 5\' 8"  (1.727 m) Weight: 226 lb (102.5 kg) BMI (Calculated): 34.37 Weight at Last Visit: 224lb Weight Lost Since Last Visit: 0 Weight Gained Since Last Visit: 2lb Starting Weight: 258lb Total Weight Loss (lbs): 32 lb (14.5 kg) Peak Weight: 260lb   Body Composition  Body Fat %: 29.2 % Fat Mass (lbs): 66 lbs Muscle Mass (lbs): 152.4 lbs Total Body Water (lbs): 110.8 lbs Visceral Fat Rating : 14   Other Clinical Data Fasting: no Labs: no Today's Visit #: 20 Starting Date: 12/23/21    Chief Complaint:   OBESITY Charles Diaz is here to discuss his progress with his obesity treatment plan. He is on the the Category 3 Plan and states he is following his eating plan approximately 75 % of the time.  He states he is exercising Strength Training/Walking 60/30 minutes 1/2 times per week.   Interim History:  07/17/2023 Annual Wellness visit with PCP/Dr. Neomi Diaz No change to medications, labs reviewed from last HWW panel Instructed to f/u annually  Exercise-He has recently added in bi-weekly walks during work day, he estimates to walk 1 mile in 30 mins.  He has restarted weekly Strength Training at [facility] on Wed afternoon  Hydration-he estimates to 50-60 oz water/day   Subjective:   1. Insulin  resistance  Latest Reference Range & Units 12/23/21 08:36 11/28/22 11:48 06/01/23 14:49  INSULIN  2.6 - 24.9 uIU/mL 9.1 16.9 12.6   Started on Zepbound  therapy on/about Feb 2024  04/11/2023- Zepbound  increased to 10mg  once weekly injection  2. Hepatic steatosis  Latest Reference Range & Units 06/01/23 14:49  Alkaline Phosphatase 44 - 121 IU/L 109  Albumin 4.1 - 5.1 g/dL 4.6  AST 0 - 40 IU/L 21  ALT 0 - 44 IU/L 25   06/08/2022 IMPRESSION: 1. Cholelithiasis without sonographic evidence of acute cholecystitis. 2. Fatty infiltration of  liver.  3. Vitamin D  deficiency  Latest Reference Range & Units 12/23/21 08:36 06/08/22 09:23 11/28/22 11:48 06/01/23 14:49  Vitamin D , 25-Hydroxy 30.0 - 100.0 ng/mL 18.3 (L) 33.2 38.4 31.3  (L): Data is abnormally low  07/03/2023-  OTC Vit D3 5,000 was stopped Weekly Ergocalciferol  was started  Assessment/Plan:   1. Insulin  resistance (Primary) Continue with weight loss efforts Monitor Labs  2. Hepatic steatosis Continue with weight loss efforts Monitor Labs  3. Vitamin D  deficiency Refill  Vitamin D , Ergocalciferol , (DRISDOL ) 1.25 MG (50000 UNIT) CAPS capsule Take 1 capsule (50,000 Units total) by mouth every 7 (seven) days. Dispense: 4 capsule, Refills: 0 ordered   4. Obesity (BMI 30-39.9), Current 34.37 Refill  tirzepatide  (ZEPBOUND ) 10 MG/0.5ML Pen Inject 10 mg into the skin once a week. Dispense: 2 mL, Refills: 0 ordered   Charles Diaz is currently in the action stage of change. As such, his goal is to continue with weight loss efforts. He has agreed to the Category 3 Plan.   Exercise goals: For substantial health benefits, adults should do at least 150 minutes (2 hours and 30 minutes) a week of moderate-intensity, or 75 minutes (1 hour and 15 minutes) a week of vigorous-intensity aerobic physical activity, or an equivalent combination of moderate- and vigorous-intensity aerobic activity. Aerobic activity should be performed in episodes of at least 10 minutes, and preferably, it should be spread throughout the week.  Behavioral modification strategies: increasing lean protein intake, decreasing simple carbohydrates, increasing vegetables, increasing  water intake, no skipping meals, meal planning and cooking strategies, keeping healthy foods in the home, ways to avoid boredom eating, and planning for success.  Charles Diaz has agreed to follow-up with our clinic in 4 weeks. He was informed of the importance of frequent follow-up visits to maximize his success with intensive lifestyle  modifications for his multiple health conditions.   Objective:   Blood pressure 99/64, pulse 85, temperature 97.9 F (36.6 C), height 5\' 8"  (1.727 m), weight 226 lb (102.5 kg), SpO2 95%. Body mass index is 34.36 kg/m.  General: Cooperative, alert, well developed, in no acute distress. HEENT: Conjunctivae and lids unremarkable. Cardiovascular: Regular rhythm.  Lungs: Normal work of breathing. Neurologic: No focal deficits.   Lab Results  Component Value Date   CREATININE 1.16 06/01/2023   BUN 14 06/01/2023   NA 142 06/01/2023   K 4.3 06/01/2023   CL 105 06/01/2023   CO2 22 06/01/2023   Lab Results  Component Value Date   ALT 25 06/01/2023   AST 21 06/01/2023   ALKPHOS 109 06/01/2023   BILITOT 0.5 06/01/2023   Lab Results  Component Value Date   HGBA1C 5.4 06/01/2023   HGBA1C 5.3 11/28/2022   HGBA1C 5.4 12/23/2021   HGBA1C 5.4 01/27/2020   HGBA1C 5.3 02/23/2018   Lab Results  Component Value Date   INSULIN  12.6 06/01/2023   INSULIN  16.9 11/28/2022   INSULIN  9.1 12/23/2021   INSULIN  15.5 07/16/2020   INSULIN  15.4 03/16/2020   Lab Results  Component Value Date   TSH 2.95 06/03/2022   Lab Results  Component Value Date   CHOL 136 06/01/2023   HDL 33 (L) 06/01/2023   LDLCALC 77 06/01/2023   LDLDIRECT 69.0 06/03/2022   TRIG 149 06/01/2023   CHOLHDL 4.1 06/01/2023   Lab Results  Component Value Date   VD25OH 31.3 06/01/2023   VD25OH 38.4 11/28/2022   VD25OH 33.2 06/08/2022   Lab Results  Component Value Date   WBC 6.9 06/03/2022   HGB 14.8 06/03/2022   HCT 43.0 06/03/2022   MCV 85.2 06/03/2022   PLT 235.0 06/03/2022   Lab Results  Component Value Date   IRON 89 10/01/2021   TIBC 357.0 10/01/2021   FERRITIN 72.9 10/01/2021   Attestation Statements:   Reviewed by clinician on day of visit: allergies, medications, problem list, medical history, surgical history, family history, social history, and previous encounter notes.  I have reviewed the  above documentation for accuracy and completeness, and I agree with the above. -  Charles Diaz d. Yosef Krogh, NP-C

## 2023-08-28 ENCOUNTER — Ambulatory Visit (INDEPENDENT_AMBULATORY_CARE_PROVIDER_SITE_OTHER): Payer: BC Managed Care – PPO | Admitting: Adult Health

## 2023-08-28 ENCOUNTER — Encounter (INDEPENDENT_AMBULATORY_CARE_PROVIDER_SITE_OTHER): Payer: Self-pay | Admitting: Adult Health

## 2023-08-28 VITALS — BP 102/68 | HR 76 | Temp 97.8°F | Ht 68.0 in | Wt 218.0 lb

## 2023-08-28 DIAGNOSIS — K760 Fatty (change of) liver, not elsewhere classified: Secondary | ICD-10-CM | POA: Diagnosis not present

## 2023-08-28 DIAGNOSIS — E559 Vitamin D deficiency, unspecified: Secondary | ICD-10-CM

## 2023-08-28 DIAGNOSIS — Z6833 Body mass index (BMI) 33.0-33.9, adult: Secondary | ICD-10-CM

## 2023-08-28 DIAGNOSIS — E88819 Insulin resistance, unspecified: Secondary | ICD-10-CM | POA: Diagnosis not present

## 2023-08-28 DIAGNOSIS — E669 Obesity, unspecified: Secondary | ICD-10-CM

## 2023-08-28 DIAGNOSIS — E782 Mixed hyperlipidemia: Secondary | ICD-10-CM | POA: Diagnosis not present

## 2023-08-28 MED ORDER — TIRZEPATIDE-WEIGHT MANAGEMENT 10 MG/0.5ML ~~LOC~~ SOAJ: 10.0000 mg | SUBCUTANEOUS | 0 refills | Status: DC

## 2023-08-28 MED ORDER — VITAMIN D (ERGOCALCIFEROL) 1.25 MG (50000 UNIT) PO CAPS: 50000.0000 [IU] | ORAL_CAPSULE | ORAL | 0 refills | Status: DC

## 2023-08-28 NOTE — Progress Notes (Signed)
 WEIGHT SUMMARY AND BIOMETRICS  Vitals Temp: 97.8 F (36.6 C) BP: 102/68 Pulse Rate: 76 SpO2: 98 %   Anthropometric Measurements Height: 5\' 8"  (1.727 m) Weight: 218 lb (98.9 kg) BMI (Calculated): 33.15 Weight at Last Visit: 226 lb Weight Lost Since Last Visit: 8 lb Weight Gained Since Last Visit: 0 Starting Weight: 258 lb Total Weight Loss (lbs): 40 lb (18.1 kg) Peak Weight: 260 lb   Body Composition  Body Fat %: 28.1 % Fat Mass (lbs): 61.4 lbs Muscle Mass (lbs): 149.2 lbs Total Body Water (lbs): 105.8 lbs Visceral Fat Rating : 13   Other Clinical Data Fasting: yes Labs: no Today's Visit #: 21 Starting Date: 12/23/21    Chief Complaint:   OBESITY Charles Diaz is here to discuss his progress with his obesity treatment plan.  He is on the the Category 3 Plan and states he is following his eating plan approximately 80 % of the time.  He states he is exercising Cardiovascular Exercise and Strength Training 50 minutes 2 times per week.   Interim History:  Started on Zepbound therapy on/about Feb 2024  04/11/2023- Zepbound increased to 10mg  once weekly injection Denies mass in neck, dysphagia, dyspepsia, persistent hoarseness, abdominal pain, or N/V/C   Reviewed Bioimpedance results with pt: Muscle Mass: -3.2 lbs Adipose Mass: - 4.6 lbs  Hunger/appetite-stable appetite.  He provided the following food recall that is typical of day Breakfast: 2 eggs with 45 cal toast, yogurt, [employer] Lunch: [place] sandwich with apple and yogurt Dinner: Either chicken or steak with a salad  He will eat "off plan" during weekends, however he is mindful of portion size  Exercise-weekly cardio/strength training at [facility] on Wednesdays  Hydration-he estimates to drink at least 50 oz water/day  Subjective:   1. Elevated triglycerides with high cholesterol Lipid Panel     Component Value Date/Time   CHOL 136 06/01/2023 1449   TRIG 149 06/01/2023 1449   HDL 33 (L)  06/01/2023 1449   CHOLHDL 4.1 06/01/2023 1449   CHOLHDL 5 06/03/2022 0924   VLDL 55.8 (H) 06/03/2022 0924   LDLCALC 77 06/01/2023 1449   LDLDIRECT 69.0 06/03/2022 0924   LABVLDL 26 06/01/2023 1449    Charles Diaz has been reducing simple CHO/sugar  2. Hepatic steatosis He denies RUQ pain He denies frequent ETOH or Acetaminophen  06/08/2022 [place] Abd Complete  IMPRESSION: 1. Cholelithiasis without sonographic evidence of acute cholecystitis. 2. Fatty infiltration of liver.  3. Insulin resistance  Latest Reference Range & Units 07/16/20 09:52 12/23/21 08:36 11/28/22 11:48 06/01/23 14:49  INSULIN 2.6 - 24.9 uIU/mL 15.5 9.1 16.9 12.6   Started on Zepbound therapy on/about Feb 2024  04/11/2023- Zepbound increased to 10mg  once weekly injection Denies mass in neck, dysphagia, dyspepsia, persistent hoarseness, abdominal pain, or N/V/C   4. Vitamin D deficiency  Latest Reference Range & Units 12/23/21 08:36 06/08/22 09:23 11/28/22 11:48 06/01/23 14:49  Vitamin D, 25-Hydroxy 30.0 - 100.0 ng/mL 18.3 (L) 33.2 38.4 31.3  (L): Data is abnormally low  He is on weekly Ergocalciferol- denies N/V/Muscle Weakness  Assessment/Plan:   1. Elevated triglycerides with high cholesterol Continue with weight loss efforts Increase regular exercise  2. Hepatic steatosis Continue with weight loss efforts Increase regular exercise  3. Insulin resistance (Primary) Continue with weight loss efforts Increase regular exercise  4. Vitamin D deficiency Refill Vitamin D, Ergocalciferol, (DRISDOL) 1.25 MG (50000 UNIT) CAPS capsule Take 1 capsule (50,000 Units total) by mouth every 7 (seven)  days. Dispense: 4 capsule, Refills: 0 ordered   5. Obesity (BMI 30-39.9), CURRENT BMI 33.2 Refill  tirzepatide (ZEPBOUND) 10 MG/0.5ML Pen Inject 10 mg into the skin once a week. Dispense: 2 mL, Refills: 0 ordered   Charles Diaz is currently in the action stage of change. As such, his goal is to continue with weight  loss efforts. He has agreed to the Category 3 Plan.   Exercise goals: For substantial health benefits, adults should do at least 150 minutes (2 hours and 30 minutes) a week of moderate-intensity, or 75 minutes (1 hour and 15 minutes) a week of vigorous-intensity aerobic physical activity, or an equivalent combination of moderate- and vigorous-intensity aerobic activity. Aerobic activity should be performed in episodes of at least 10 minutes, and preferably, it should be spread throughout the week.  Behavioral modification strategies: increasing lean protein intake, decreasing simple carbohydrates, increasing vegetables, increasing water intake, decreasing eating out, no skipping meals, meal planning and cooking strategies, keeping healthy foods in the home, ways to avoid boredom eating, and planning for success.  Charles Diaz has agreed to follow-up with our clinic in 4 weeks. He was informed of the importance of frequent follow-up visits to maximize his success with intensive lifestyle modifications for his multiple health conditions.   Check Fasting Labs at next OV  Objective:   Blood pressure 102/68, pulse 76, temperature 97.8 F (36.6 C), height 5\' 8"  (1.727 m), weight 218 lb (98.9 kg), SpO2 98%. Body mass index is 33.15 kg/m.  General: Cooperative, alert, well developed, in no acute distress. HEENT: Conjunctivae and lids unremarkable. Cardiovascular: Regular rhythm.  Lungs: Normal work of breathing. Neurologic: No focal deficits.   Lab Results  Component Value Date   CREATININE 1.16 06/01/2023   BUN 14 06/01/2023   NA 142 06/01/2023   K 4.3 06/01/2023   CL 105 06/01/2023   CO2 22 06/01/2023   Lab Results  Component Value Date   ALT 25 06/01/2023   AST 21 06/01/2023   ALKPHOS 109 06/01/2023   BILITOT 0.5 06/01/2023   Lab Results  Component Value Date   HGBA1C 5.4 06/01/2023   HGBA1C 5.3 11/28/2022   HGBA1C 5.4 12/23/2021   HGBA1C 5.4 01/27/2020   HGBA1C 5.3 02/23/2018   Lab  Results  Component Value Date   INSULIN 12.6 06/01/2023   INSULIN 16.9 11/28/2022   INSULIN 9.1 12/23/2021   INSULIN 15.5 07/16/2020   INSULIN 15.4 03/16/2020   Lab Results  Component Value Date   TSH 2.95 06/03/2022   Lab Results  Component Value Date   CHOL 136 06/01/2023   HDL 33 (L) 06/01/2023   LDLCALC 77 06/01/2023   LDLDIRECT 69.0 06/03/2022   TRIG 149 06/01/2023   CHOLHDL 4.1 06/01/2023   Lab Results  Component Value Date   VD25OH 31.3 06/01/2023   VD25OH 38.4 11/28/2022   VD25OH 33.2 06/08/2022   Lab Results  Component Value Date   WBC 6.9 06/03/2022   HGB 14.8 06/03/2022   HCT 43.0 06/03/2022   MCV 85.2 06/03/2022   PLT 235.0 06/03/2022   Lab Results  Component Value Date   IRON 89 10/01/2021   TIBC 357.0 10/01/2021   FERRITIN 72.9 10/01/2021   Attestation Statements:   Reviewed by clinician on day of visit: allergies, medications, problem list, medical history, surgical history, family history, social history, and previous encounter notes.  I have reviewed the above documentation for accuracy and completeness, and I agree with the above. -  Althea Backs d. Angellina Ferdinand, NP-C

## 2023-09-26 ENCOUNTER — Ambulatory Visit (INDEPENDENT_AMBULATORY_CARE_PROVIDER_SITE_OTHER): Admitting: Physician Assistant

## 2023-09-27 ENCOUNTER — Encounter (INDEPENDENT_AMBULATORY_CARE_PROVIDER_SITE_OTHER): Payer: Self-pay | Admitting: Adult Health

## 2023-09-27 ENCOUNTER — Ambulatory Visit (INDEPENDENT_AMBULATORY_CARE_PROVIDER_SITE_OTHER): Admitting: Adult Health

## 2023-09-27 ENCOUNTER — Other Ambulatory Visit (HOSPITAL_COMMUNITY): Payer: Self-pay

## 2023-09-27 VITALS — BP 96/65 | HR 73 | Temp 97.6°F | Ht 68.0 in | Wt 218.0 lb

## 2023-09-27 DIAGNOSIS — E559 Vitamin D deficiency, unspecified: Secondary | ICD-10-CM | POA: Diagnosis not present

## 2023-09-27 DIAGNOSIS — E88819 Insulin resistance, unspecified: Secondary | ICD-10-CM | POA: Diagnosis not present

## 2023-09-27 DIAGNOSIS — Z6833 Body mass index (BMI) 33.0-33.9, adult: Secondary | ICD-10-CM

## 2023-09-27 DIAGNOSIS — K760 Fatty (change of) liver, not elsewhere classified: Secondary | ICD-10-CM | POA: Diagnosis not present

## 2023-09-27 DIAGNOSIS — E669 Obesity, unspecified: Secondary | ICD-10-CM

## 2023-09-27 DIAGNOSIS — E782 Mixed hyperlipidemia: Secondary | ICD-10-CM

## 2023-09-27 MED ORDER — VITAMIN D (ERGOCALCIFEROL) 1.25 MG (50000 UNIT) PO CAPS
50000.0000 [IU] | ORAL_CAPSULE | ORAL | 0 refills | Status: DC
  Filled 2023-09-27: qty 4, 28d supply, fill #0

## 2023-09-27 MED ORDER — TIRZEPATIDE-WEIGHT MANAGEMENT 10 MG/0.5ML ~~LOC~~ SOAJ
10.0000 mg | SUBCUTANEOUS | 0 refills | Status: DC
  Filled 2023-09-27: qty 2, 28d supply, fill #0

## 2023-09-27 NOTE — Progress Notes (Signed)
 WEIGHT SUMMARY AND BIOMETRICS  Vitals Temp: 97.6 F (36.4 C) BP: 96/65 Pulse Rate: 73 SpO2: 98 %   Anthropometric Measurements Height: 5\' 8"  (1.727 m) Weight: 218 lb (98.9 kg) BMI (Calculated): 33.15 Weight at Last Visit: 218lb Weight Lost Since Last Visit: 0 Weight Gained Since Last Visit: 0 Starting Weight: 258lb Total Weight Loss (lbs): 40 lb (18.1 kg) Peak Weight: 260lb   Body Composition  Body Fat %: 29.4 % Fat Mass (lbs): 64 lbs Muscle Mass (lbs): 146.4 lbs Total Body Water (lbs): 106.2 lbs Visceral Fat Rating : 14   Other Clinical Data Fasting: yes Labs: yes Today's Visit #: 22 Starting Date: 12/23/21    Chief Complaint:   OBESITY Charles Diaz is here to discuss his progress with his obesity treatment plan.  He is on the the Category 3 Plan and states he is following his eating plan approximately 75 % of the time.  He states he is exercising Strength Training/Walking 60/20 minutes 1/2-3 times per week.   Interim History:  Initial Start with HWW 01/27/2020 Restarted HWW 12/23/2021   08/02/2022- started on Zepbound therapy He is currently on weekly Zepbound 10mg  since on/about 04/11/2023 Denies mass in neck, dysphagia, dyspepsia, persistent hoarseness, abdominal pain, or N/V/C   He is exercising in the office gym for an hour each Wednesday and now walking 20 mins 2-3 times per week.  Reviewed Bioimpedance results with pt: Muscle Mass:- 2.8 lbs  Adipose Mass: + 2.6 lbs  Subjective:   1. Elevated triglycerides with high cholesterol He denies heavy CHO or ETOH use He is compliant Lovaza 1g: 2 caps BID He is also on weekly Zepbound 10mg   2. Hepatic steatosis He is also on weekly Zepbound 10mg  He denies RUG pain He denies heavy ETOH use  3. Insulin resistance He is also on weekly Zepbound 10mg  Denies mass in neck, dysphagia, dyspepsia, persistent hoarseness, abdominal pain, or N/V/C    Latest Reference Range & Units 07/16/20 09:52 12/23/21 08:36  11/28/22 11:48 06/01/23 14:49  INSULIN 2.6 - 24.9 uIU/mL 15.5 9.1 16.9 12.6    4. Vitamin D deficiency  Latest Reference Range & Units 12/23/21 08:36 06/08/22 09:23 11/28/22 11:48 06/01/23 14:49  Vitamin D, 25-Hydroxy 30.0 - 100.0 ng/mL 18.3 (L) 33.2 38.4 31.3  (L): Data is abnormally low  She is on weekly Ergocalciferol- denies N/V/Muscle Weakness He endorses stable energy levels He reports that the children are sleeping well through the night, daughter age 42 and son 64 mnths  Assessment/Plan:   1. Elevated triglycerides with high cholesterol (Primary) Check Labs - Lipid panel  2. Hepatic steatosis Check Labs - Comprehensive metabolic panel with GFR  3. Insulin resistance Check Labs - Hemoglobin A1c - Insulin, random  4. Vitamin D deficiency Check Labs - VITAMIN D 25 Hydroxy (Vit-D Deficiency, Fractures)  5. Obesity (BMI 30-39.9), CURRENT BMI 33.15  Charles Diaz is currently in the action stage of change. As such, his goal is to continue with weight loss efforts. He has agreed to the Category 3 Plan.   Exercise goals: For substantial health benefits, adults should do at least 150 minutes (2 hours and 30 minutes) a week of moderate-intensity, or 75 minutes (1 hour and 15 minutes) a week of vigorous-intensity aerobic physical activity, or an equivalent combination of moderate- and vigorous-intensity aerobic activity. Aerobic activity should be performed in episodes of at least 10 minutes, and preferably, it should be spread throughout the week.  Behavioral modification strategies: increasing lean protein intake, decreasing  simple carbohydrates, increasing vegetables, increasing water intake, no skipping meals, meal planning and cooking strategies, keeping healthy foods in the home, ways to avoid boredom eating, and planning for success.  Charles Diaz has agreed to follow-up with our clinic in 4 weeks. He was informed of the importance of frequent follow-up visits to maximize his success with  intensive lifestyle modifications for his multiple health conditions.   Charles Diaz was informed we would discuss his lab results at his next visit unless there is a critical issue that needs to be addressed sooner. Charles Diaz agreed to keep his next visit at the agreed upon time to discuss these results.  Objective:   Blood pressure 96/65, pulse 73, temperature 97.6 F (36.4 C), height 5\' 8"  (1.727 m), weight 218 lb (98.9 kg), SpO2 98%. Body mass index is 33.15 kg/m.  General: Cooperative, alert, well developed, in no acute distress. HEENT: Conjunctivae and lids unremarkable. Cardiovascular: Regular rhythm.  Lungs: Normal work of breathing. Neurologic: No focal deficits.   Lab Results  Component Value Date   CREATININE 1.16 06/01/2023   BUN 14 06/01/2023   NA 142 06/01/2023   K 4.3 06/01/2023   CL 105 06/01/2023   CO2 22 06/01/2023   Lab Results  Component Value Date   ALT 25 06/01/2023   AST 21 06/01/2023   ALKPHOS 109 06/01/2023   BILITOT 0.5 06/01/2023   Lab Results  Component Value Date   HGBA1C 5.4 06/01/2023   HGBA1C 5.3 11/28/2022   HGBA1C 5.4 12/23/2021   HGBA1C 5.4 01/27/2020   HGBA1C 5.3 02/23/2018   Lab Results  Component Value Date   INSULIN 12.6 06/01/2023   INSULIN 16.9 11/28/2022   INSULIN 9.1 12/23/2021   INSULIN 15.5 07/16/2020   INSULIN 15.4 03/16/2020   Lab Results  Component Value Date   TSH 2.95 06/03/2022   Lab Results  Component Value Date   CHOL 136 06/01/2023   HDL 33 (L) 06/01/2023   LDLCALC 77 06/01/2023   LDLDIRECT 69.0 06/03/2022   TRIG 149 06/01/2023   CHOLHDL 4.1 06/01/2023   Lab Results  Component Value Date   VD25OH 31.3 06/01/2023   VD25OH 38.4 11/28/2022   VD25OH 33.2 06/08/2022   Lab Results  Component Value Date   WBC 6.9 06/03/2022   HGB 14.8 06/03/2022   HCT 43.0 06/03/2022   MCV 85.2 06/03/2022   PLT 235.0 06/03/2022   Lab Results  Component Value Date   IRON 89 10/01/2021   TIBC 357.0 10/01/2021   FERRITIN  72.9 10/01/2021   Attestation Statements:   Reviewed by clinician on day of visit: allergies, medications, problem list, medical history, surgical history, family history, social history, and previous encounter notes.  I have reviewed the above documentation for accuracy and completeness, and I agree with the above. -  Oreoluwa Aigner d. Lovell Nuttall, NP-C

## 2023-09-28 LAB — COMPREHENSIVE METABOLIC PANEL WITH GFR
ALT: 21 IU/L (ref 0–44)
AST: 19 IU/L (ref 0–40)
Albumin: 4.4 g/dL (ref 4.1–5.1)
Alkaline Phosphatase: 94 IU/L (ref 44–121)
BUN/Creatinine Ratio: 12 (ref 9–20)
BUN: 13 mg/dL (ref 6–24)
Bilirubin Total: 0.4 mg/dL (ref 0.0–1.2)
CO2: 21 mmol/L (ref 20–29)
Calcium: 9.2 mg/dL (ref 8.7–10.2)
Chloride: 105 mmol/L (ref 96–106)
Creatinine, Ser: 1.05 mg/dL (ref 0.76–1.27)
Globulin, Total: 2.4 g/dL (ref 1.5–4.5)
Glucose: 86 mg/dL (ref 70–99)
Potassium: 4.2 mmol/L (ref 3.5–5.2)
Sodium: 141 mmol/L (ref 134–144)
Total Protein: 6.8 g/dL (ref 6.0–8.5)
eGFR: 91 mL/min/{1.73_m2} (ref >59)

## 2023-09-28 LAB — HEMOGLOBIN A1C
Est. average glucose Bld gHb Est-mCnc: 103 mg/dL
Hgb A1c MFr Bld: 5.2 % (ref 4.8–5.6)

## 2023-09-28 LAB — VITAMIN D 25 HYDROXY (VIT D DEFICIENCY, FRACTURES): Vit D, 25-Hydroxy: 50.8 ng/mL (ref 30.0–100.0)

## 2023-09-28 LAB — LIPID PANEL
Chol/HDL Ratio: 4.9 ratio (ref 0.0–5.0)
Cholesterol, Total: 148 mg/dL (ref 100–199)
HDL: 30 mg/dL — ABNORMAL LOW (ref >39)
LDL Chol Calc (NIH): 78 mg/dL (ref 0–99)
Triglycerides: 239 mg/dL — ABNORMAL HIGH (ref 0–149)
VLDL Cholesterol Cal: 40 mg/dL (ref 5–40)

## 2023-09-28 LAB — INSULIN, RANDOM: INSULIN: 5.3 u[IU]/mL (ref 2.6–24.9)

## 2023-10-05 ENCOUNTER — Telehealth (INDEPENDENT_AMBULATORY_CARE_PROVIDER_SITE_OTHER): Payer: Self-pay | Admitting: *Deleted

## 2023-10-05 NOTE — Telephone Encounter (Addendum)
 Prior authorization has been submitted to insurance thru coverMymeds, waiting for response.(Zepbound-10 mg/0.5 mL,pen).  CaseId:[id number];Status:Approved;Review Type:Prior Auth;Coverage Start Date:09/05/2023;Coverage End Date:10/04/2024;. Authorization Expiration Date: October 04, 2024. Patient has been updated thru Mychart.

## 2023-10-23 ENCOUNTER — Encounter (INDEPENDENT_AMBULATORY_CARE_PROVIDER_SITE_OTHER): Payer: Self-pay | Admitting: Adult Health

## 2023-10-23 ENCOUNTER — Telehealth (INDEPENDENT_AMBULATORY_CARE_PROVIDER_SITE_OTHER): Payer: Self-pay

## 2023-10-23 ENCOUNTER — Other Ambulatory Visit (HOSPITAL_COMMUNITY): Payer: Self-pay

## 2023-10-23 ENCOUNTER — Ambulatory Visit (INDEPENDENT_AMBULATORY_CARE_PROVIDER_SITE_OTHER): Admitting: Adult Health

## 2023-10-23 VITALS — BP 113/67 | HR 66 | Temp 98.3°F | Ht 68.0 in | Wt 219.0 lb

## 2023-10-23 DIAGNOSIS — E88819 Insulin resistance, unspecified: Secondary | ICD-10-CM

## 2023-10-23 DIAGNOSIS — E559 Vitamin D deficiency, unspecified: Secondary | ICD-10-CM | POA: Diagnosis not present

## 2023-10-23 DIAGNOSIS — E782 Mixed hyperlipidemia: Secondary | ICD-10-CM

## 2023-10-23 DIAGNOSIS — K760 Fatty (change of) liver, not elsewhere classified: Secondary | ICD-10-CM

## 2023-10-23 DIAGNOSIS — Z6833 Body mass index (BMI) 33.0-33.9, adult: Secondary | ICD-10-CM

## 2023-10-23 DIAGNOSIS — E669 Obesity, unspecified: Secondary | ICD-10-CM

## 2023-10-23 MED ORDER — OMEGA-3-ACID ETHYL ESTERS 1 G PO CAPS
1.0000 g | ORAL_CAPSULE | Freq: Two times a day (BID) | ORAL | 0 refills | Status: DC
Start: 2023-10-23 — End: 2024-03-18
  Filled 2023-10-23: qty 60, 30d supply, fill #0

## 2023-10-23 MED ORDER — FENOFIBRATE 160 MG PO TABS: 160.0000 mg | ORAL_TABLET | Freq: Every day | ORAL | 2 refills | Status: DC

## 2023-10-23 MED ORDER — OMEGA-3-ACID ETHYL ESTERS 1 G PO CAPS
1.0000 g | ORAL_CAPSULE | Freq: Two times a day (BID) | ORAL | 0 refills | Status: DC
Start: 2023-10-23 — End: 2023-10-23

## 2023-10-23 MED ORDER — VITAMIN D (ERGOCALCIFEROL) 1.25 MG (50000 UNIT) PO CAPS
50000.0000 [IU] | ORAL_CAPSULE | ORAL | 0 refills | Status: DC
  Filled 2023-10-23: qty 4, 28d supply, fill #0

## 2023-10-23 MED ORDER — VITAMIN D (ERGOCALCIFEROL) 1.25 MG (50000 UNIT) PO CAPS: 50000.0000 [IU] | ORAL_CAPSULE | ORAL | 0 refills | Status: DC

## 2023-10-23 MED ORDER — FENOFIBRATE 160 MG PO TABS
160.0000 mg | ORAL_TABLET | Freq: Every day | ORAL | 2 refills | Status: DC
Start: 2023-10-23 — End: 2024-03-18
  Filled 2023-10-23: qty 30, 30d supply, fill #0

## 2023-10-23 MED ORDER — TIRZEPATIDE-WEIGHT MANAGEMENT 10 MG/0.5ML ~~LOC~~ SOAJ
10.0000 mg | SUBCUTANEOUS | 0 refills | Status: DC
  Filled 2023-10-23: qty 2, 28d supply, fill #0

## 2023-10-23 NOTE — Progress Notes (Signed)
 WEIGHT SUMMARY AND BIOMETRICS  Vitals Temp: 98.3 F (36.8 C) BP: 113/67 Pulse Rate: 66 SpO2: 99 %   Anthropometric Measurements Height: 5\' 8"  (1.727 m) Weight: 219 lb (99.3 kg) BMI (Calculated): 33.31 Weight at Last Visit: 218 lb Weight Lost Since Last Visit: 0 Weight Gained Since Last Visit: 1 lb Starting Weight: 258 lb Total Weight Loss (lbs): 39 lb (17.7 kg) Peak Weight: 260 lb   Body Composition  Body Fat %: 27.4 % Fat Mass (lbs): 60.2 lbs Muscle Mass (lbs): 151.8 lbs Total Body Water (lbs): 105.8 lbs Visceral Fat Rating : 13   Other Clinical Data Fasting: no Labs: no Today's Visit #: 23 Starting Date: 12/23/21    Chief Complaint:   OBESITY Charles Diaz is here to discuss his progress with his obesity treatment plan.  He is on the the Category 3 Plan and states he is following his eating plan approximately 80 % of the time.  He states he is exercising Cardiovascular and Strength Training 45 minutes 3 times per week.  Interim History:  Charles Diaz has started exercise regime with two friends from work. The trio will utilize the [employer] Tues/Wed/Thurs just prior to lunch  Reviewed [employer] results with pt: Muscle Mass: +5.4 lbs Adipose Mass: -3.8 lbs  08/02/2022- started on Zepbound  therapy He is currently on weekly Zepbound  10mg  since on/about 04/11/2023 Denies mass in neck, dysphagia, dyspepsia, persistent hoarseness, abdominal pain, or N/V/C   Subjective:   1. Vitamin D  deficiency Discussed Labs  Latest Reference Range & Units 09/27/23 12:35  Vitamin D , 25-Hydroxy 30.0 - 100.0 ng/mL 50.8   Vit D stable and at goal He is on weekly Ergocalciferol - denies N/V/Muscle Weakness  2. Hepatic steatosis Discussed Labs  Latest Reference Range & Units 09/27/23 12:35  Alkaline Phosphatase 44 - 121 IU/L 94  Albumin 4.1 - 5.1 g/dL 4.4  AST 0 - 40 IU/L 19  ALT 0 - 44 IU/L 21   Liver enzymes are stable  3. Insulin  resistance Discussed Labs   Latest Reference Range & Units 09/27/23 12:35  Glucose 70 - 99 mg/dL 86  Hemoglobin W0J 4.8 - 5.6 % 5.2  Est. average glucose Bld gHb Est-mCnc mg/dL 811  INSULIN  2.6 - 24.9 uIU/mL 5.3   All levels are stable and at goal 08/02/2022- started on Zepbound  therapy He is currently on weekly Zepbound  10mg  since on/about 04/11/2023 Denies mass in neck, dysphagia, dyspepsia, persistent hoarseness, abdominal pain, or N/V/C   4. Elevated triglycerides with high cholesterol Discussed Labs Lipid Panel     Component Value Date/Time   CHOL 148 09/27/2023 1235   TRIG 239 (H) 09/27/2023 1235   HDL 30 (L) 09/27/2023 1235   CHOLHDL 4.9 09/27/2023 1235   CHOLHDL 5 06/03/2022 0924   VLDL 55.8 (H) 06/03/2022 0924   LDLCALC 78 09/27/2023 1235   LDLDIRECT 69.0 06/03/2022 0924   LABVLDL 40 09/27/2023 1235    The 10-year ASCVD risk score (Arnett DK, et al., 2019) is: 1.3%   Values used to calculate the score:     Age: 43 years     Sex: Male     Is Non-Hispanic African American: No     Diabetic: No     Tobacco smoker: No     Systolic Blood Pressure: 113 mmHg     Is BP treated: No     HDL Cholesterol: 30 mg/dL     Total Cholesterol: 148 mg/dL   Total/Tgs below goal HDL  Assessment/Plan:  1. Vitamin D  deficiency Refill Vitamin D , Ergocalciferol , (DRISDOL ) 1.25 MG (50000 UNIT) CAPS capsule Take 1 capsule (50,000 Units total) by mouth every 7 (seven) days. Dispense: 4 capsule, Refills: 0 of 0 remaining   2. Hepatic steatosis (Primary) Continue with weight loss efforts Continue weekly Zepbpund  3. Insulin  resistance Continue with weight loss efforts Continue weekly Zepbpund  4. Elevated triglycerides with high cholesterol Refill  omega-3 acid ethyl esters (LOVAZA ) 1 g capsule Take 1 capsule (1 g total) by mouth 2 (two) times daily. Dispense: 180 capsule, Refills: 0 of 0 remaining  Refill  fenofibrate  160 MG tablet Take 1 tablet (160 mg total) by mouth daily. Dispense: 90 tablet, Refills:  2 of 2 remaining   5. Obesity (BMI 30-39.9), CURRENT BMI 33.4 Refill tirzepatide  (ZEPBOUND ) 10 MG/0.5ML Pen Inject 10 mg into the skin once a week. Dispense: 2 mL, Refills: 0 of 0 remaining   Charles Diaz is currently in the action stage of change. As such, his goal is to continue with weight loss efforts. He has agreed to the Category 3 Plan.   Exercise goals: For substantial health benefits, adults should do at least 150 minutes (2 hours and 30 minutes) a week of moderate-intensity, or 75 minutes (1 hour and 15 minutes) a week of vigorous-intensity aerobic physical activity, or an equivalent combination of moderate- and vigorous-intensity aerobic activity. Aerobic activity should be performed in episodes of at least 10 minutes, and preferably, it should be spread throughout the week.  Behavioral modification strategies: increasing lean protein intake, decreasing simple carbohydrates, increasing vegetables, increasing water intake, no skipping meals, meal planning and cooking strategies, keeping healthy foods in the home, and planning for success.  Charles Diaz has agreed to follow-up with our clinic in 4 weeks. He was informed of the importance of frequent follow-up visits to maximize his success with intensive lifestyle modifications for his multiple health conditions.   Objective:   Blood pressure 113/67, pulse 66, temperature 98.3 F (36.8 C), height 5\' 8"  (1.727 m), weight 219 lb (99.3 kg), SpO2 99%. Body mass index is 33.3 kg/m.  General: Cooperative, alert, well developed, in no acute distress. HEENT: Conjunctivae and lids unremarkable. Cardiovascular: Regular rhythm.  Lungs: Normal work of breathing. Neurologic: No focal deficits.   Lab Results  Component Value Date   CREATININE 1.05 09/27/2023   BUN 13 09/27/2023   NA 141 09/27/2023   K 4.2 09/27/2023   CL 105 09/27/2023   CO2 21 09/27/2023   Lab Results  Component Value Date   ALT 21 09/27/2023   AST 19 09/27/2023   ALKPHOS 94  09/27/2023   BILITOT 0.4 09/27/2023   Lab Results  Component Value Date   HGBA1C 5.2 09/27/2023   HGBA1C 5.4 06/01/2023   HGBA1C 5.3 11/28/2022   HGBA1C 5.4 12/23/2021   HGBA1C 5.4 01/27/2020   Lab Results  Component Value Date   INSULIN  5.3 09/27/2023   INSULIN  12.6 06/01/2023   INSULIN  16.9 11/28/2022   INSULIN  9.1 12/23/2021   INSULIN  15.5 07/16/2020   Lab Results  Component Value Date   TSH 2.95 06/03/2022   Lab Results  Component Value Date   CHOL 148 09/27/2023   HDL 30 (L) 09/27/2023   LDLCALC 78 09/27/2023   LDLDIRECT 69.0 06/03/2022   TRIG 239 (H) 09/27/2023   CHOLHDL 4.9 09/27/2023   Lab Results  Component Value Date   VD25OH 50.8 09/27/2023   VD25OH 31.3 06/01/2023   VD25OH 38.4 11/28/2022   Lab Results  Component  Value Date   WBC 6.9 06/03/2022   HGB 14.8 06/03/2022   HCT 43.0 06/03/2022   MCV 85.2 06/03/2022   PLT 235.0 06/03/2022   Lab Results  Component Value Date   IRON 89 10/01/2021   TIBC 357.0 10/01/2021   FERRITIN 72.9 10/01/2021   Attestation Statements:   Reviewed by clinician on day of visit: allergies, medications, problem list, medical history, surgical history, family history, social history, and previous encounter notes.  I have reviewed the above documentation for accuracy and completeness, and I agree with the above. -  Michon Kaczmarek d. Ithiel Liebler, NP-C

## 2023-10-23 NOTE — Telephone Encounter (Signed)
 PA questions for [employer]  have been answered and all documentation has been included. Waiting on a determination.

## 2023-10-23 NOTE — Telephone Encounter (Signed)
 PA for [employer] 3 has been submitted, awaiting PA questions.

## 2023-10-23 NOTE — Telephone Encounter (Signed)
 Your request has been approved CaseId:[id number];Status:Approved;Review Type:Prior Auth;Coverage Start Date:09/23/2023;Coverage End Date:10/22/2024. Patient has been updated thru [employer], spoke with patient as well.

## 2023-11-27 ENCOUNTER — Ambulatory Visit (INDEPENDENT_AMBULATORY_CARE_PROVIDER_SITE_OTHER): Admitting: Adult Health

## 2023-11-27 ENCOUNTER — Other Ambulatory Visit (HOSPITAL_COMMUNITY): Payer: Self-pay

## 2023-11-27 ENCOUNTER — Encounter (INDEPENDENT_AMBULATORY_CARE_PROVIDER_SITE_OTHER): Payer: Self-pay | Admitting: Adult Health

## 2023-11-27 VITALS — BP 111/65 | HR 73 | Temp 98.1°F | Ht 68.0 in | Wt 218.0 lb

## 2023-11-27 DIAGNOSIS — E88819 Insulin resistance, unspecified: Secondary | ICD-10-CM

## 2023-11-27 DIAGNOSIS — E669 Obesity, unspecified: Secondary | ICD-10-CM

## 2023-11-27 DIAGNOSIS — Z6839 Body mass index (BMI) 39.0-39.9, adult: Secondary | ICD-10-CM

## 2023-11-27 DIAGNOSIS — E66812 Obesity, class 2: Secondary | ICD-10-CM

## 2023-11-27 DIAGNOSIS — K760 Fatty (change of) liver, not elsewhere classified: Secondary | ICD-10-CM

## 2023-11-27 DIAGNOSIS — E559 Vitamin D deficiency, unspecified: Secondary | ICD-10-CM

## 2023-11-27 DIAGNOSIS — E782 Mixed hyperlipidemia: Secondary | ICD-10-CM

## 2023-11-27 DIAGNOSIS — Z6833 Body mass index (BMI) 33.0-33.9, adult: Secondary | ICD-10-CM

## 2023-11-27 MED ORDER — VITAMIN D (ERGOCALCIFEROL) 1.25 MG (50000 UNIT) PO CAPS: 50000.0000 [IU] | ORAL_CAPSULE | ORAL | 0 refills | Status: DC

## 2023-11-27 MED ORDER — VITAMIN D (ERGOCALCIFEROL) 1.25 MG (50000 UNIT) PO CAPS
50000.0000 [IU] | ORAL_CAPSULE | ORAL | 0 refills | Status: DC
  Filled 2023-11-27: qty 4, 28d supply, fill #0

## 2023-11-27 MED ORDER — TIRZEPATIDE-WEIGHT MANAGEMENT 10 MG/0.5ML ~~LOC~~ SOAJ
10.0000 mg | SUBCUTANEOUS | 0 refills | Status: DC
  Filled 2023-11-27: qty 2, 28d supply, fill #0

## 2023-11-27 MED ORDER — TIRZEPATIDE-WEIGHT MANAGEMENT 10 MG/0.5ML ~~LOC~~ SOAJ: 10.0000 mg | SUBCUTANEOUS | 0 refills | Status: DC

## 2023-11-27 NOTE — Progress Notes (Signed)
 WEIGHT SUMMARY AND BIOMETRICS  Vitals Temp: 98.1 F (36.7 C) BP: 111/65 Pulse Rate: 73 SpO2: 98 %   Anthropometric Measurements Height: 5\' 8"  (1.727 m) Weight: 218 lb (98.9 kg) BMI (Calculated): 33.15 Weight at Last Visit: 219 lb Weight Lost Since Last Visit: 1 lb Weight Gained Since Last Visit: 0 Starting Weight: 258 lb Total Weight Loss (lbs): 40 lb (18.1 kg) Peak Weight: 260 lb   Body Composition  Body Fat %: 28.4 % Fat Mass (lbs): 62 lbs Muscle Mass (lbs): 148.2 lbs Total Body Water (lbs): 108 lbs Visceral Fat Rating : 13   Other Clinical Data Fasting: no Labs: no Today's Visit #: 24 Starting Date: 12/23/21    Chief Complaint:   OBESITY Charles Diaz is here to discuss his progress with his obesity treatment plan.  He is on the the Category 3 Plan and states he is following his eating plan approximately 75 % of the time.  He states he is exercising Strength Training 60 minutes 3 times per week.  Interim History:  3 week trip to [place], [place] 30 July- 12 August  Exercise- he has been strength training 3 times weekly- Tues: Chest Wed: Legs Thur: Back/Shoulders  Hydration-he estimates to drink 5 cups water/day, 12 oz per cup  Subjective:   1. Hepatic steatosis 06/08/2022 Narrative & Impression  CLINICAL DATA:  Elevated liver function tests.   EXAM: ABDOMEN ULTRASOUND COMPLETE   COMPARISON:  None Available.   FINDINGS: Gallbladder: Gallstones noted in the gallbladder. No wall thickening visualized. No sonographic Murphy sign noted by sonographer.   Common bile duct: Diameter: 2.4 mm   Liver: No focal lesion identified. Diffuse increased echotexture of the liver. Portal vein is patent on color Doppler imaging with normal direction of blood flow towards the liver.   IVC: Ultrasound technologist reports limited visualization.   Pancreas: Ultrasound technologist reports limited visualization.   Spleen: Size and appearance within normal  limits.   Right Kidney: Length: 10.8 cm. Echogenicity within normal limits. No mass or hydronephrosis visualized.   Left Kidney: Length: 12.2 cm. Echogenicity within normal limits. No mass or hydronephrosis visualized.   Abdominal aorta: No aneurysm visualized.   Other findings: None.   IMPRESSION: 1. Cholelithiasis without sonographic evidence of acute cholecystitis. 2. Fatty infiltration of liver.     2. Elevated triglycerides with high cholesterol He reports compliance with Fenofibrate  160mg  daily and Lovaza  1mg  BID He denies tobacco use He denies RUQ pain  3. Insulin  resistance He has been eating on MP at least 75% and increased regular exercise- Strength Training 3 x weekly 08/02/2022- started on Zepbound  therapy He is currently on weekly Zepbound  10mg  since on/about 04/11/2023 Denies mass in neck, dysphagia, dyspepsia, persistent hoarseness, abdominal pain, or N/V/C   4. Vitamin D  deficiency  Latest Reference Range & Units 11/28/22 11:48 06/01/23 14:49 09/27/23 12:35  Vitamin D , 25-Hydroxy 30.0 - 100.0 ng/mL 38.4 31.3 50.8   He is on weekly Ergocalciferol - denies N/V/Muscle Weakness  Assessment/Plan:   1. Hepatic steatosis Continue with healthy eating, regular exercise, and weekly Zepbound  threapy  2. Elevated triglycerides with high cholesterol (Primary) Continue with healthy eating, regular exercise, daily Fenofibrate  160mg  , BID Lovaza  1g, and weekly Zepbound  therapy  3. Insulin  resistance Continue with healthy eating, regular exercise, and weekly Zepbound  threapy  4. Vitamin D  deficiency Refill  Vitamin D , Ergocalciferol , (DRISDOL ) 1.25 MG (50000 UNIT) CAPS capsule Take 1 capsule (50,000 Units total) by mouth every 7 (seven) days. Dispense: 4 capsule,  Refills: 0 ordered   5. Obesity, Current BMI 33.1 Refill tirzepatide  (ZEPBOUND ) 10 MG/0.5ML Pen Inject 10 mg into the skin once a week. Dispense: 2 mL, Refills: 0 ordered   Charles Diaz is currently in the action  stage of change. As such, his goal is to continue with weight loss efforts. He has agreed to the Category 3 Plan.   Exercise goals: For substantial health benefits, adults should do at least 150 minutes (2 hours and 30 minutes) a week of moderate-intensity, or 75 minutes (1 hour and 15 minutes) a week of vigorous-intensity aerobic physical activity, or an equivalent combination of moderate- and vigorous-intensity aerobic activity. Aerobic activity should be performed in episodes of at least 10 minutes, and preferably, it should be spread throughout the week.  Behavioral modification strategies: increasing lean protein intake, decreasing simple carbohydrates, increasing vegetables, increasing water intake, no skipping meals, meal planning and cooking strategies, keeping healthy foods in the home, ways to avoid boredom eating, and planning for success.  Charles Diaz has agreed to follow-up with our clinic in 4 weeks. He was informed of the importance of frequent follow-up visits to maximize his success with intensive lifestyle modifications for his multiple health conditions.   Objective:   Blood pressure 111/65, pulse 73, temperature 98.1 F (36.7 C), height 5\' 8"  (1.727 m), weight 218 lb (98.9 kg), SpO2 98%. Body mass index is 33.15 kg/m.  General: Cooperative, alert, well developed, in no acute distress. HEENT: Conjunctivae and lids unremarkable. Cardiovascular: Regular rhythm.  Lungs: Normal work of breathing. Neurologic: No focal deficits.   Lab Results  Component Value Date   CREATININE 1.05 09/27/2023   BUN 13 09/27/2023   NA 141 09/27/2023   K 4.2 09/27/2023   CL 105 09/27/2023   CO2 21 09/27/2023   Lab Results  Component Value Date   ALT 21 09/27/2023   AST 19 09/27/2023   ALKPHOS 94 09/27/2023   BILITOT 0.4 09/27/2023   Lab Results  Component Value Date   HGBA1C 5.2 09/27/2023   HGBA1C 5.4 06/01/2023   HGBA1C 5.3 11/28/2022   HGBA1C 5.4 12/23/2021   HGBA1C 5.4 01/27/2020    Lab Results  Component Value Date   INSULIN  5.3 09/27/2023   INSULIN  12.6 06/01/2023   INSULIN  16.9 11/28/2022   INSULIN  9.1 12/23/2021   INSULIN  15.5 07/16/2020   Lab Results  Component Value Date   TSH 2.95 06/03/2022   Lab Results  Component Value Date   CHOL 148 09/27/2023   HDL 30 (L) 09/27/2023   LDLCALC 78 09/27/2023   LDLDIRECT 69.0 06/03/2022   TRIG 239 (H) 09/27/2023   CHOLHDL 4.9 09/27/2023   Lab Results  Component Value Date   VD25OH 50.8 09/27/2023   VD25OH 31.3 06/01/2023   VD25OH 38.4 11/28/2022   Lab Results  Component Value Date   WBC 6.9 06/03/2022   HGB 14.8 06/03/2022   HCT 43.0 06/03/2022   MCV 85.2 06/03/2022   PLT 235.0 06/03/2022   Lab Results  Component Value Date   IRON 89 10/01/2021   TIBC 357.0 10/01/2021   FERRITIN 72.9 10/01/2021   Attestation Statements:   Reviewed by clinician on day of visit: allergies, medications, problem list, medical history, surgical history, family history, social history, and previous encounter notes.  I have reviewed the above documentation for accuracy and completeness, and I agree with the above. -  Dellie Piasecki d. Kisa Fujii, NP-C

## 2023-12-07 ENCOUNTER — Other Ambulatory Visit (HOSPITAL_COMMUNITY): Payer: Self-pay

## 2023-12-25 ENCOUNTER — Other Ambulatory Visit: Payer: Self-pay

## 2023-12-25 ENCOUNTER — Encounter (INDEPENDENT_AMBULATORY_CARE_PROVIDER_SITE_OTHER): Payer: Self-pay | Admitting: Adult Health

## 2023-12-25 ENCOUNTER — Ambulatory Visit (INDEPENDENT_AMBULATORY_CARE_PROVIDER_SITE_OTHER): Admitting: Adult Health

## 2023-12-25 ENCOUNTER — Other Ambulatory Visit (HOSPITAL_COMMUNITY): Payer: Self-pay

## 2023-12-25 VITALS — BP 110/76 | HR 77 | Temp 98.3°F | Ht 68.0 in | Wt 221.0 lb

## 2023-12-25 DIAGNOSIS — E669 Obesity, unspecified: Secondary | ICD-10-CM | POA: Diagnosis not present

## 2023-12-25 DIAGNOSIS — Z6833 Body mass index (BMI) 33.0-33.9, adult: Secondary | ICD-10-CM

## 2023-12-25 DIAGNOSIS — E88819 Insulin resistance, unspecified: Secondary | ICD-10-CM | POA: Diagnosis not present

## 2023-12-25 DIAGNOSIS — K760 Fatty (change of) liver, not elsewhere classified: Secondary | ICD-10-CM | POA: Diagnosis not present

## 2023-12-25 DIAGNOSIS — E559 Vitamin D deficiency, unspecified: Secondary | ICD-10-CM

## 2023-12-25 MED ORDER — TIRZEPATIDE-WEIGHT MANAGEMENT 10 MG/0.5ML ~~LOC~~ SOAJ
10.0000 mg | SUBCUTANEOUS | 0 refills | Status: DC
  Filled 2023-12-25: qty 2, 28d supply, fill #0

## 2023-12-25 MED ORDER — VITAMIN D (ERGOCALCIFEROL) 1.25 MG (50000 UNIT) PO CAPS
50000.0000 [IU] | ORAL_CAPSULE | ORAL | 0 refills | Status: DC
  Filled 2023-12-25: qty 4, 28d supply, fill #0

## 2023-12-25 NOTE — Progress Notes (Signed)
 WEIGHT SUMMARY AND BIOMETRICS  Vitals Temp: 98.3 F (36.8 C) BP: 110/76 Pulse Rate: 77 SpO2: 98 %   Anthropometric Measurements Height: 5' 8 (1.727 m) Weight: 221 lb (100.2 kg) BMI (Calculated): 33.61 Weight at Last Visit: 218lb Weight Lost Since Last Visit: 0 Weight Gained Since Last Visit: 3lb Starting Weight: 258lb Total Weight Loss (lbs): 37 lb (16.8 kg) Peak Weight: 260lb   Body Composition  Body Fat %: 28.4 % Fat Mass (lbs): 63 lbs Muscle Mass (lbs): 150.8 lbs Total Body Water (lbs): 109.4 lbs Visceral Fat Rating : 14   Other Clinical Data Fasting: no Labs: no Today's Visit #: 25 Starting Date: 12/23/21    Chief Complaint:   OBESITY Charles Diaz is here to discuss his progress with his obesity treatment plan.  He is on the the Category 3 Plan and states he is following his eating plan approximately 75 % of the time.  He states he is exercising Cardio/Walking 60 minutes 3 times per week.  Interim History:  08/02/2022- started on Zepbound  therapy He is currently on weekly Zepbound  10mg  since on/about 04/11/2023 Denies mass in neck, dysphagia, dyspepsia, persistent hoarseness, abdominal pain, or N/V/C   08/03/2023 Weight- 253 lbs 20% weight loss would equate to 50.6 lbs, down to 202 lbs He has lost 37 lbs since starting on Zepbound  therapy  Current weight 221 lbs Goal weight 210 lbs  Subjective:   1. Insulin  resistance  Latest Reference Range & Units 11/28/22 11:48 06/01/23 14:49 09/27/23 12:35  INSULIN  2.6 - 24.9 uIU/mL 16.9 12.6 5.3   08/02/2022- started on Zepbound  therapy He is currently on weekly Zepbound  10mg  since on/about 04/11/2023 Denies mass in neck, dysphagia, dyspepsia, persistent hoarseness, abdominal pain, or N/V/C   2. Vitamin D  deficiency  Latest Reference Range & Units 11/28/22 11:48 06/01/23 14:49 09/27/23 12:35  Vitamin D , 25-Hydroxy 30.0 - 100.0 ng/mL 38.4 31.3 50.8   He is on weekly Ergocalciferol - denies N/V/Muscle  Weakness  3. Hepatic steatosis 06/08/2022 Narrative & Impression  CLINICAL DATA:  Elevated liver function tests.   EXAM: ABDOMEN ULTRASOUND COMPLETE   COMPARISON:  None Available.   FINDINGS: Gallbladder: Gallstones noted in the gallbladder. No wall thickening visualized. No sonographic Murphy sign noted by sonographer.   Common bile duct: Diameter: 2.4 mm   Liver: No focal lesion identified. Diffuse increased echotexture of the liver. Portal vein is patent on color Doppler imaging with normal direction of blood flow towards the liver.   IVC: Ultrasound technologist reports limited visualization.   Pancreas: Ultrasound technologist reports limited visualization.   Spleen: Size and appearance within normal limits.   Right Kidney: Length: 10.8 cm. Echogenicity within normal limits. No mass or hydronephrosis visualized.   Left Kidney: Length: 12.2 cm. Echogenicity within normal limits. No mass or hydronephrosis visualized.   Abdominal aorta: No aneurysm visualized.   Other findings: None.   IMPRESSION: 1. Cholelithiasis without sonographic evidence of acute cholecystitis. 2. Fatty infiltration of liver.    08/02/2022- started on Zepbound  therapy He is currently on weekly Zepbound  10mg  since on/about 04/11/2023 Denies mass in neck, dysphagia, dyspepsia, persistent hoarseness, abdominal pain, or N/V/C   Assessment/Plan:   1. Insulin  resistance (Primary) Continue healthy eating and regular exercise  2. Vitamin D  deficiency Refill  Vitamin D , Ergocalciferol , (DRISDOL ) 1.25 MG (50000 UNIT) CAPS capsule Take 1 capsule (50,000 Units total) by mouth every 7 (seven) days. Dispense: 4 capsule, Refills: 0 of 0 remaining   3. Hepatic steatosis Continue healthy eating and  regular exercise  4. Obesity (BMI 30-39.9), CURRENT BMI 33.61 Refill  tirzepatide  (ZEPBOUND ) 10 MG/0.5ML Pen Inject 10 mg into the skin once a week. Dispense: 2 mL, Refills: 0 of 0 remaining      Charles Diaz is currently in the action stage of change. As such, his goal is to continue with weight loss efforts. He has agreed to the Category 3 Plan.   Exercise goals: For substantial health benefits, adults should do at least 150 minutes (2 hours and 30 minutes) a week of moderate-intensity, or 75 minutes (1 hour and 15 minutes) a week of vigorous-intensity aerobic physical activity, or an equivalent combination of moderate- and vigorous-intensity aerobic activity. Aerobic activity should be performed in episodes of at least 10 minutes, and preferably, it should be spread throughout the week.  Behavioral modification strategies: increasing lean protein intake, decreasing simple carbohydrates, increasing vegetables, increasing water intake, no skipping meals, meal planning and cooking strategies, keeping healthy foods in the home, ways to avoid boredom eating, and planning for success.  Charles Diaz has agreed to follow-up with our clinic in 4 weeks. He was informed of the importance of frequent follow-up visits to maximize his success with intensive lifestyle modifications for his multiple health conditions.   Objective:   Blood pressure 110/76, pulse 77, temperature 98.3 F (36.8 C), height 5' 8 (1.727 m), weight 221 lb (100.2 kg), SpO2 98%. Body mass index is 33.6 kg/m.  General: Cooperative, alert, well developed, in no acute distress. HEENT: Conjunctivae and lids unremarkable. Cardiovascular: Regular rhythm.  Lungs: Normal work of breathing. Neurologic: No focal deficits.   Lab Results  Component Value Date   CREATININE 1.05 09/27/2023   BUN 13 09/27/2023   NA 141 09/27/2023   K 4.2 09/27/2023   CL 105 09/27/2023   CO2 21 09/27/2023   Lab Results  Component Value Date   ALT 21 09/27/2023   AST 19 09/27/2023   ALKPHOS 94 09/27/2023   BILITOT 0.4 09/27/2023   Lab Results  Component Value Date   HGBA1C 5.2 09/27/2023   HGBA1C 5.4 06/01/2023   HGBA1C 5.3 11/28/2022   HGBA1C 5.4  12/23/2021   HGBA1C 5.4 01/27/2020   Lab Results  Component Value Date   INSULIN  5.3 09/27/2023   INSULIN  12.6 06/01/2023   INSULIN  16.9 11/28/2022   INSULIN  9.1 12/23/2021   INSULIN  15.5 07/16/2020   Lab Results  Component Value Date   TSH 2.95 06/03/2022   Lab Results  Component Value Date   CHOL 148 09/27/2023   HDL 30 (L) 09/27/2023   LDLCALC 78 09/27/2023   LDLDIRECT 69.0 06/03/2022   TRIG 239 (H) 09/27/2023   CHOLHDL 4.9 09/27/2023   Lab Results  Component Value Date   VD25OH 50.8 09/27/2023   VD25OH 31.3 06/01/2023   VD25OH 38.4 11/28/2022   Lab Results  Component Value Date   WBC 6.9 06/03/2022   HGB 14.8 06/03/2022   HCT 43.0 06/03/2022   MCV 85.2 06/03/2022   PLT 235.0 06/03/2022   Lab Results  Component Value Date   IRON 89 10/01/2021   TIBC 357.0 10/01/2021   FERRITIN 72.9 10/01/2021   Attestation Statements:   Reviewed by clinician on day of visit: allergies, medications, problem list, medical history, surgical history, family history, social history, and previous encounter notes.  I have reviewed the above documentation for accuracy and completeness, and I agree with the above. -  Tinisha Etzkorn d. Paisely Brick, NP-C

## 2023-12-28 ENCOUNTER — Encounter (INDEPENDENT_AMBULATORY_CARE_PROVIDER_SITE_OTHER): Payer: Self-pay | Admitting: Adult Health

## 2023-12-28 ENCOUNTER — Other Ambulatory Visit (HOSPITAL_COMMUNITY): Payer: Self-pay

## 2023-12-28 ENCOUNTER — Other Ambulatory Visit (INDEPENDENT_AMBULATORY_CARE_PROVIDER_SITE_OTHER): Payer: Self-pay | Admitting: Adult Health

## 2023-12-28 MED ORDER — TIRZEPATIDE-WEIGHT MANAGEMENT 10 MG/0.5ML ~~LOC~~ SOAJ
10.0000 mg | SUBCUTANEOUS | 0 refills | Status: DC
Start: 2023-12-28 — End: 2024-03-18
  Filled 2024-01-05: qty 2, 28d supply, fill #0

## 2024-01-05 ENCOUNTER — Other Ambulatory Visit (HOSPITAL_COMMUNITY): Payer: Self-pay

## 2024-02-13 ENCOUNTER — Encounter (INDEPENDENT_AMBULATORY_CARE_PROVIDER_SITE_OTHER): Payer: Self-pay | Admitting: Adult Health

## 2024-02-13 ENCOUNTER — Ambulatory Visit (INDEPENDENT_AMBULATORY_CARE_PROVIDER_SITE_OTHER): Admitting: Adult Health

## 2024-02-13 VITALS — BP 103/69 | HR 89 | Temp 98.5°F | Ht 68.0 in | Wt 220.0 lb

## 2024-02-13 DIAGNOSIS — K760 Fatty (change of) liver, not elsewhere classified: Secondary | ICD-10-CM | POA: Diagnosis not present

## 2024-02-13 DIAGNOSIS — E88819 Insulin resistance, unspecified: Secondary | ICD-10-CM

## 2024-02-13 DIAGNOSIS — E782 Mixed hyperlipidemia: Secondary | ICD-10-CM | POA: Diagnosis not present

## 2024-02-13 DIAGNOSIS — E559 Vitamin D deficiency, unspecified: Secondary | ICD-10-CM

## 2024-02-13 DIAGNOSIS — E669 Obesity, unspecified: Secondary | ICD-10-CM

## 2024-02-13 DIAGNOSIS — Z6833 Body mass index (BMI) 33.0-33.9, adult: Secondary | ICD-10-CM

## 2024-02-14 ENCOUNTER — Other Ambulatory Visit (HOSPITAL_COMMUNITY): Payer: Self-pay

## 2024-02-14 MED ORDER — VITAMIN D (ERGOCALCIFEROL) 1.25 MG (50000 UNIT) PO CAPS
50000.0000 [IU] | ORAL_CAPSULE | ORAL | 0 refills | Status: DC
  Filled 2024-02-14: qty 4, 28d supply, fill #0

## 2024-02-14 NOTE — Progress Notes (Signed)
 WEIGHT SUMMARY AND BIOMETRICS  Vitals Temp: 98.5 F (36.9 C) BP: 103/69 Pulse Rate: 89 SpO2: 99 %   Anthropometric Measurements Height: 5' 8 (1.727 m) Weight: 220 lb (99.8 kg) BMI (Calculated): 33.46 Weight at Last Visit: 221 LB Weight Lost Since Last Visit: 1 LB Weight Gained Since Last Visit: 0 Starting Weight: 258 LB Total Weight Loss (lbs): 38 lb (17.2 kg) Peak Weight: 260 LB   Body Composition  Body Fat %: 29.4 % Fat Mass (lbs): 65 lbs Muscle Mass (lbs): 148.2 lbs Total Body Water (lbs): 109.6 lbs Visceral Fat Rating : 14   Other Clinical Data Fasting: NO Labs: NO Today's Visit #: 26 Starting Date: 12/23/21    Chief Complaint:   OBESITY Charles Diaz is here to discuss his progress with his obesity treatment plan.  He is on the the Category 3 Plan and states he is following his eating plan approximately 75 % of the time.  He states he is exercising Strength Training 60 minutes 3 times per week.  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 Insulin  resistance  Latest Reference Range & Units 11/28/22 11:48 06/01/23 14:49 09/27/23 12:35  INSULIN  2.6 - 24.9 uIU/mL 16.9 12.6 5.3   08/02/2022- started on Zepbound  therapy He is currently on weekly Zepbound  10mg  since on/about 04/11/2023 Denies mass in neck, dysphagia, dyspepsia, persistent hoarseness, abdominal pain, or N/V/C   2. Vitamin D  deficiency  Latest Reference Range & Units 11/28/22 11:48 06/01/23 14:49 09/27/23 12:35  Vitamin D , 25-Hydroxy 30.0 - 100.0 ng/mL 38.4  31.3 50.8   He is on weekly Ergocalciferol - denies N/V/Muscle Weakness  3. Elevated triglycerides with high cholesterol  Latest Reference Range & Units 11/28/22 11:48 06/01/23 14:49 09/27/23 12:35  Triglycerides 0 - 149 mg/dL 771 (H) 850 760 (H)  (H): Data is abnormally high  HWW manages daily Fenofibrate  160mg  and Lovaza  1g BID He denies RUQ pain  4. Hepatic steatosis 06/08/2022 US  ABD COMPLETE CLINICAL DATA:  Elevated liver function tests.   EXAM: ABDOMEN ULTRASOUND COMPLETE   COMPARISON:  None Available.   FINDINGS: Gallbladder: Gallstones noted in the gallbladder. No wall thickening visualized. No sonographic Murphy sign noted by sonographer.   Common bile duct: Diameter: 2.4 mm   Liver: No focal lesion identified. Diffuse increased echotexture of the liver. Portal vein is patent on color Doppler imaging with normal direction of blood flow towards the liver.   IVC: Ultrasound technologist reports limited visualization.   Pancreas: Ultrasound technologist reports limited visualization.   Spleen: Size and appearance within normal limits.   Right Kidney: Length: 10.8 cm. Echogenicity within normal limits. No mass or hydronephrosis visualized.   Left Kidney: Length: 12.2 cm. Echogenicity within normal limits. No mass or hydronephrosis visualized.   Abdominal aorta: No aneurysm visualized.   Other findings: None.   IMPRESSION: 1. Cholelithiasis without sonographic evidence of acute cholecystitis. 2. Fatty infiltration of liver.    Assessment/Plan:   1. Insulin  resistance (Primary) Limit simple CHO/sugar use Increase regular cardiovascular exercise  2. Vitamin D  deficiency Refill Vitamin D , Ergocalciferol , (DRISDOL ) 1.25 MG (  50000 UNIT) CAPS capsule Take 1 capsule (50,000 Units total) by mouth every 7 (seven) days. Dispense: 4 capsule, Refills: 0 ordered   3. Elevated triglycerides with high cholesterol Limit simple CHO/sugar/ETOH use Increase  cardiovascular exercise  4. Hepatic steatosis Limit simple CHO/sugar/ETOH use Increase cardiovascular exercise  5. Obesity (BMI 30-39.9), CURRENT BMI 33.6  Charles Diaz is currently in the action stage of change. As such, his goal is to continue with weight loss efforts. He has agreed to the Category 3 Plan.   Exercise goals: For substantial health benefits, adults should do at least 150 minutes (2 hours and 30 minutes) a week of moderate-intensity, or 75 minutes (1 hour and 15 minutes) a week of vigorous-intensity aerobic physical activity, or an equivalent combination of moderate- and vigorous-intensity aerobic activity. Aerobic activity should be performed in episodes of at least 10 minutes, and preferably, it should be spread throughout the week.  Behavioral modification strategies: increasing lean protein intake, decreasing simple carbohydrates, increasing vegetables, increasing water intake, no skipping meals, meal planning and cooking strategies, keeping healthy foods in the home, ways to avoid boredom eating, and planning for success.  Charles Diaz has agreed to follow-up with our clinic in 4 weeks. He was informed of the importance of frequent follow-up visits to maximize his success with intensive lifestyle modifications for his multiple health conditions.   Check Fasting Labs and IC at next OV Pt aware to arrive 30 mins early and to be fasting  Objective:   Blood pressure 103/69, pulse 89, temperature 98.5 F (36.9 C), height 5' 8 (1.727 m), weight 220 lb (99.8 kg), SpO2 99%. Body mass index is 33.45 kg/m.  General: Cooperative, alert, well developed, in no acute distress. HEENT: Conjunctivae and lids unremarkable. Cardiovascular: Regular rhythm.  Lungs: Normal work of breathing. Neurologic: No focal deficits.   Lab Results  Component Value Date   CREATININE 1.05 09/27/2023   BUN 13 09/27/2023   NA 141 09/27/2023   K 4.2 09/27/2023   CL 105 09/27/2023   CO2 21 09/27/2023   Lab  Results  Component Value Date   ALT 21 09/27/2023   AST 19 09/27/2023   ALKPHOS 94 09/27/2023   BILITOT 0.4 09/27/2023   Lab Results  Component Value Date   HGBA1C 5.2 09/27/2023   HGBA1C 5.4 06/01/2023   HGBA1C 5.3 11/28/2022   HGBA1C 5.4 12/23/2021   HGBA1C 5.4 01/27/2020   Lab Results  Component Value Date   INSULIN  5.3 09/27/2023   INSULIN  12.6 06/01/2023   INSULIN  16.9 11/28/2022   INSULIN  9.1 12/23/2021   INSULIN  15.5 07/16/2020   Lab Results  Component Value Date   TSH 2.95 06/03/2022   Lab Results  Component Value Date   CHOL 148 09/27/2023   HDL 30 (L) 09/27/2023   LDLCALC 78 09/27/2023   LDLDIRECT 69.0 06/03/2022   TRIG 239 (H) 09/27/2023   CHOLHDL 4.9 09/27/2023   Lab Results  Component Value Date   VD25OH 50.8 09/27/2023   VD25OH 31.3 06/01/2023   VD25OH 38.4 11/28/2022   Lab Results  Component Value Date   WBC 6.9 06/03/2022   HGB 14.8 06/03/2022   HCT 43.0 06/03/2022   MCV 85.2 06/03/2022   PLT 235.0 06/03/2022   Lab Results  Component Value Date   IRON 89 10/01/2021   TIBC 357.0 10/01/2021   FERRITIN 72.9 10/01/2021   Attestation Statements:   Reviewed by clinician on day of visit: allergies, medications, problem list, medical history, surgical history, family history, social  history, and previous encounter notes.  I have reviewed the above documentation for accuracy and completeness, and I agree with the above. -  Lamarco Gudiel d. Lasondra Hodgkins, NP-C

## 2024-02-23 ENCOUNTER — Other Ambulatory Visit (HOSPITAL_COMMUNITY): Payer: Self-pay

## 2024-03-12 ENCOUNTER — Ambulatory Visit (INDEPENDENT_AMBULATORY_CARE_PROVIDER_SITE_OTHER): Admitting: Adult Health

## 2024-03-18 ENCOUNTER — Other Ambulatory Visit (INDEPENDENT_AMBULATORY_CARE_PROVIDER_SITE_OTHER): Payer: Self-pay | Admitting: Adult Health

## 2024-03-18 ENCOUNTER — Ambulatory Visit (INDEPENDENT_AMBULATORY_CARE_PROVIDER_SITE_OTHER): Admitting: Adult Health

## 2024-03-18 ENCOUNTER — Encounter (INDEPENDENT_AMBULATORY_CARE_PROVIDER_SITE_OTHER): Payer: Self-pay | Admitting: Adult Health

## 2024-03-18 ENCOUNTER — Other Ambulatory Visit (HOSPITAL_COMMUNITY): Payer: Self-pay

## 2024-03-18 VITALS — BP 91/59 | HR 66 | Temp 97.4°F | Ht 68.0 in | Wt 215.0 lb

## 2024-03-18 DIAGNOSIS — Z0000 Encounter for general adult medical examination without abnormal findings: Secondary | ICD-10-CM

## 2024-03-18 DIAGNOSIS — Z6833 Body mass index (BMI) 33.0-33.9, adult: Secondary | ICD-10-CM

## 2024-03-18 DIAGNOSIS — E782 Mixed hyperlipidemia: Secondary | ICD-10-CM | POA: Diagnosis not present

## 2024-03-18 DIAGNOSIS — K760 Fatty (change of) liver, not elsewhere classified: Secondary | ICD-10-CM

## 2024-03-18 DIAGNOSIS — E88819 Insulin resistance, unspecified: Secondary | ICD-10-CM

## 2024-03-18 DIAGNOSIS — E669 Obesity, unspecified: Secondary | ICD-10-CM

## 2024-03-18 DIAGNOSIS — E559 Vitamin D deficiency, unspecified: Secondary | ICD-10-CM

## 2024-03-18 DIAGNOSIS — Z6839 Body mass index (BMI) 39.0-39.9, adult: Secondary | ICD-10-CM

## 2024-03-18 MED ORDER — VITAMIN D (ERGOCALCIFEROL) 1.25 MG (50000 UNIT) PO CAPS
50000.0000 [IU] | ORAL_CAPSULE | ORAL | 0 refills | Status: DC
  Filled 2024-03-18: qty 4, 28d supply, fill #0

## 2024-03-18 MED ORDER — OMEGA-3-ACID ETHYL ESTERS 1 G PO CAPS
1.0000 g | ORAL_CAPSULE | Freq: Two times a day (BID) | ORAL | 0 refills | Status: AC
  Filled 2024-03-18: qty 60, 30d supply, fill #0

## 2024-03-18 MED ORDER — FENOFIBRATE 160 MG PO TABS
160.0000 mg | ORAL_TABLET | Freq: Every day | ORAL | 2 refills | Status: AC
Start: 2024-03-18 — End: ?
  Filled 2024-03-18: qty 90, 90d supply, fill #0
  Filled 2024-03-22: qty 30, 30d supply, fill #0

## 2024-03-18 MED ORDER — TIRZEPATIDE-WEIGHT MANAGEMENT 7.5 MG/0.5ML ~~LOC~~ SOAJ
7.5000 mg | SUBCUTANEOUS | 0 refills | Status: DC
  Filled 2024-03-18: qty 2, 28d supply, fill #0

## 2024-03-18 NOTE — Progress Notes (Addendum)
 WEIGHT SUMMARY AND BIOMETRICS  Vitals Temp: (!) 97.4 F (36.3 C) BP: (!) 91/59 Pulse Rate: 66 SpO2: 99 %   Anthropometric Measurements Height: 5' 8 (1.727 m) Weight: 215 lb (97.5 kg) BMI (Calculated): 32.7 Weight at Last Visit: 220 lb Weight Lost Since Last Visit: 5 lb Weight Gained Since Last Visit: 0 Starting Weight: 258 lb Total Weight Loss (lbs): 43 lb (19.5 kg) Peak Weight: 260 lb   Body Composition  Body Fat %: 27.9 % Fat Mass (lbs): 60.2 lbs Muscle Mass (lbs): 148 lbs Total Body Water (lbs): 106.4 lbs Visceral Fat Rating : 13   Other Clinical Data Fasting: yes Labs: yes Today's Visit #: 51 Starting Date: 12/23/21    Chief Complaint:   OBESITY Charles Diaz is here to discuss his progress with his obesity treatment plan.  He is on the the Category 3 Plan and states he is following his eating plan approximately 75 % of the time.  He states he is exercising Cardio/Strength Training 90 minutes 3 times per week.  Interim History:  08/02/2022- started on Zepbound  therapy He is currently on weekly Zepbound  10mg  since on/about 04/11/2023 He recently revived communication from his insurance provider that GIP/GLP-1 therapy will not be covered after Dec 2025  Recommend that he begin titrating down Zepbound  each month. Reminded that proper nutritions, hydration, sleep, and regular exercise can maintain weight loss.  Will complete fasting labs, due to timing- unable to complete IC at OV today  Subjective:   1. Hepatic steatosis 06/08/2022 US  ABD COMPLETE  CLINICAL DATA:  Elevated liver function tests.   EXAM: ABDOMEN ULTRASOUND COMPLETE   COMPARISON:  None Available.   FINDINGS: Gallbladder: Gallstones noted in the gallbladder. No wall thickening visualized. No sonographic Murphy sign noted by sonographer.   Common bile duct: Diameter: 2.4 mm   Liver: No focal lesion identified. Diffuse increased echotexture of the liver. Portal vein is patent on  color Doppler imaging with normal direction of blood flow towards the liver.   IVC: Ultrasound technologist reports limited visualization.   Pancreas: Ultrasound technologist reports limited visualization.   Spleen: Size and appearance within normal limits.   Right Kidney: Length: 10.8 cm. Echogenicity within normal limits. No mass or hydronephrosis visualized.   Left Kidney: Length: 12.2 cm. Echogenicity within normal limits. No mass or hydronephrosis visualized.   Abdominal aorta: No aneurysm visualized.   Other findings: None.   IMPRESSION: 1. Cholelithiasis without sonographic evidence of acute cholecystitis. 2. Fatty infiltration of liver.     2. Insulin  resistance 08/02/2022- started on Zepbound  therapy He is currently on weekly Zepbound  10mg  since on/about 04/11/2023 He recently revived communication from his insurance provider that GIP/GLP-1 therapy will not be covered after Dec 2025  Recommend that he begin titrating down Zepbound  each month. Reminded that proper nutritions, hydration, sleep, and regular exercise can maintain weight loss.  3. Vitamin D  deficiency He is on weekly Ergocalciferol - denies N/V/Muscle Weakness  4. Elevated triglycerides with high cholesterol 06/08/2022 US  ABD COMPLETE IMPRESSION: 1. Cholelithiasis without sonographic evidence of acute cholecystitis. 2. Fatty infiltration of liver.  He is on daily Lovaza  1g 1 cap BID He is on daily Fenofibrate  160mg  1 tab daily  5. Healthcare maintenance He endorses stable energy levels He has continued with regular exercise at [employer], at least 3 times weekly!  Assessment/Plan:   1. Hepatic steatosis (Primary) Check Labs - Comprehensive metabolic panel with GFR Avoid Hepatotoxic substances Continue with weight loss efforts F/u with PCP  in Dec 2025  2. Insulin  resistance Check Labs - Hemoglobin A1c - Insulin , random Refill and DECREASE   tirzepatide  (ZEPBOUND ) 7.5 MG/0.5ML Pen  Inject 7.5 mg into the skin once a week. Dispense: 2 mL, Refills: 0 of 0 remaining   3. Vitamin D  deficiency Check Labs - VITAMIN D  25 Hydroxy (Vit-D Deficiency, Fractures)  4. Elevated triglycerides with high cholesterol Check Labs - Lipid panel Limit sat fat and continue with regular cardiovascular exercise Refill fenofibrate  160 MG tablet Take 1 tablet (160 mg total) by mouth daily. Dispense: 90 tablet, Refills: 2 of 2 remaining  Refill omega-3 acid ethyl esters (LOVAZA ) 1 g capsule Take 1 capsule (1 g total) by mouth 2 (two) times daily. Dispense: 180 capsule, Refills: 0 ordered   5. Healthcare maintenance Check Labs -B12  6. Obesity, Current BMI 33.1 Refill and DECREASE   tirzepatide  (ZEPBOUND ) 7.5 MG/0.5ML Pen Inject 7.5 mg into the skin once a week. Dispense: 2 mL, Refills: 0 of 0 remaining   Charles Diaz currently in the action stage of change. As such, his goal is to continue with weight loss efforts. He has agreed to the Category 3 Plan.   Exercise goals: For substantial health benefits, adults should do at least 150 minutes (2 hours and 30 minutes) a week of moderate-intensity, or 75 minutes (1 hour and 15 minutes) a week of vigorous-intensity aerobic physical activity, or an equivalent combination of moderate- and vigorous-intensity aerobic activity. Aerobic activity should be performed in episodes of at least 10 minutes, and preferably, it should be spread throughout the week.  Behavioral modification strategies: increasing lean protein intake, decreasing simple carbohydrates, increasing vegetables, increasing water intake, meal planning and cooking strategies, keeping healthy foods in the home, ways to avoid boredom eating, ways to avoid night time snacking, and planning for success.  Charles Diaz has agreed to follow-up with our clinic in 4 weeks. He was informed of the importance of frequent follow-up visits to maximize his success with intensive lifestyle modifications for his multiple  health conditions.   Check IC when next round of fasting labs completed. Charles Diaz was informed we would discuss his lab results at his next visit unless there is a critical issue that needs to be addressed sooner. Charles Diaz agreed to keep his next visit at the agreed upon time to discuss these results.  Objective:   Blood pressure (!) 91/59, pulse 66, temperature (!) 97.4 F (36.3 C), height 5' 8 (1.727 m), weight 215 lb (97.5 kg), SpO2 99%. Body mass index is 32.69 kg/m.  General: Cooperative, alert, well developed, in no acute distress. HEENT: Conjunctivae and lids unremarkable. Cardiovascular: Regular rhythm.  Lungs: Normal work of breathing. Neurologic: No focal deficits.   Lab Results  Component Value Date   CREATININE 1.05 09/27/2023   BUN 13 09/27/2023   NA 141 09/27/2023   K 4.2 09/27/2023   CL 105 09/27/2023   CO2 21 09/27/2023   Lab Results  Component Value Date   ALT 21 09/27/2023   AST 19 09/27/2023   ALKPHOS 94 09/27/2023   BILITOT 0.4 09/27/2023   Lab Results  Component Value Date   HGBA1C 5.2 09/27/2023   HGBA1C 5.4 06/01/2023   HGBA1C 5.3 11/28/2022   HGBA1C 5.4 12/23/2021   HGBA1C 5.4 01/27/2020   Lab Results  Component Value Date   INSULIN  5.3 09/27/2023   INSULIN  12.6 06/01/2023   INSULIN  16.9 11/28/2022   INSULIN  9.1 12/23/2021   INSULIN  15.5 07/16/2020   Lab Results  Component  Value Date   TSH 2.95 06/03/2022   Lab Results  Component Value Date   CHOL 148 09/27/2023   HDL 30 (L) 09/27/2023   LDLCALC 78 09/27/2023   LDLDIRECT 69.0 06/03/2022   TRIG 239 (H) 09/27/2023   CHOLHDL 4.9 09/27/2023   Lab Results  Component Value Date   VD25OH 50.8 09/27/2023   VD25OH 31.3 06/01/2023   VD25OH 38.4 11/28/2022   Lab Results  Component Value Date   WBC 6.9 06/03/2022   HGB 14.8 06/03/2022   HCT 43.0 06/03/2022   MCV 85.2 06/03/2022   PLT 235.0 06/03/2022   Lab Results  Component Value Date   IRON 89 10/01/2021   TIBC 357.0 10/01/2021    FERRITIN 72.9 10/01/2021   Attestation Statements:   Reviewed by clinician on day of visit: allergies, medications, problem list, medical history, surgical history, family history, social history, and previous encounter notes.  I have reviewed the above documentation for accuracy and completeness, and I agree with the above. -  Virat Prather d. Nikira Kushnir, NP-C

## 2024-03-19 LAB — COMPREHENSIVE METABOLIC PANEL WITH GFR
ALT: 23 IU/L (ref 0–44)
AST: 26 IU/L (ref 0–40)
Albumin: 4.4 g/dL (ref 4.1–5.1)
Alkaline Phosphatase: 87 IU/L (ref 47–123)
BUN/Creatinine Ratio: 16 (ref 9–20)
BUN: 18 mg/dL (ref 6–24)
Bilirubin Total: 0.5 mg/dL (ref 0.0–1.2)
CO2: 19 mmol/L — ABNORMAL LOW (ref 20–29)
Calcium: 9.2 mg/dL (ref 8.7–10.2)
Chloride: 106 mmol/L (ref 96–106)
Creatinine, Ser: 1.13 mg/dL (ref 0.76–1.27)
Globulin, Total: 2.2 g/dL (ref 1.5–4.5)
Glucose: 84 mg/dL (ref 70–99)
Potassium: 3.9 mmol/L (ref 3.5–5.2)
Sodium: 141 mmol/L (ref 134–144)
Total Protein: 6.6 g/dL (ref 6.0–8.5)
eGFR: 83 mL/min/1.73 (ref >59)

## 2024-03-19 LAB — HEMOGLOBIN A1C
Est. average glucose Bld gHb Est-mCnc: 105 mg/dL
Hgb A1c MFr Bld: 5.3 % (ref 4.8–5.6)

## 2024-03-19 LAB — LIPID PANEL
Chol/HDL Ratio: 4 ratio (ref 0.0–5.0)
Cholesterol, Total: 149 mg/dL (ref 100–199)
HDL: 37 mg/dL — ABNORMAL LOW (ref >39)
LDL Chol Calc (NIH): 88 mg/dL (ref 0–99)
Triglycerides: 137 mg/dL (ref 0–149)
VLDL Cholesterol Cal: 24 mg/dL (ref 5–40)

## 2024-03-19 LAB — VITAMIN D 25 HYDROXY (VIT D DEFICIENCY, FRACTURES): Vit D, 25-Hydroxy: 37.7 ng/mL (ref 30.0–100.0)

## 2024-03-19 LAB — INSULIN, RANDOM: INSULIN: 11.4 u[IU]/mL (ref 2.6–24.9)

## 2024-03-19 LAB — VITAMIN B12: Vitamin B-12: 408 pg/mL (ref 232–1245)

## 2024-03-22 ENCOUNTER — Other Ambulatory Visit (HOSPITAL_COMMUNITY): Payer: Self-pay

## 2024-04-08 ENCOUNTER — Ambulatory Visit (INDEPENDENT_AMBULATORY_CARE_PROVIDER_SITE_OTHER): Admitting: Adult Health

## 2024-04-08 ENCOUNTER — Encounter (INDEPENDENT_AMBULATORY_CARE_PROVIDER_SITE_OTHER): Payer: Self-pay | Admitting: Adult Health

## 2024-04-08 ENCOUNTER — Other Ambulatory Visit (HOSPITAL_COMMUNITY): Payer: Self-pay

## 2024-04-08 VITALS — BP 114/76 | HR 83 | Temp 97.4°F | Ht 68.0 in | Wt 213.0 lb

## 2024-04-08 DIAGNOSIS — E559 Vitamin D deficiency, unspecified: Secondary | ICD-10-CM | POA: Diagnosis not present

## 2024-04-08 DIAGNOSIS — E66812 Obesity, class 2: Secondary | ICD-10-CM | POA: Diagnosis not present

## 2024-04-08 DIAGNOSIS — K760 Fatty (change of) liver, not elsewhere classified: Secondary | ICD-10-CM | POA: Diagnosis not present

## 2024-04-08 DIAGNOSIS — R0602 Shortness of breath: Secondary | ICD-10-CM

## 2024-04-08 DIAGNOSIS — E88819 Insulin resistance, unspecified: Secondary | ICD-10-CM

## 2024-04-08 DIAGNOSIS — Z6839 Body mass index (BMI) 39.0-39.9, adult: Secondary | ICD-10-CM

## 2024-04-08 DIAGNOSIS — Z6832 Body mass index (BMI) 32.0-32.9, adult: Secondary | ICD-10-CM

## 2024-04-08 MED ORDER — VITAMIN D (ERGOCALCIFEROL) 1.25 MG (50000 UNIT) PO CAPS
50000.0000 [IU] | ORAL_CAPSULE | ORAL | 0 refills | Status: DC
  Filled 2024-04-08: qty 4, 28d supply, fill #0

## 2024-04-08 NOTE — Progress Notes (Signed)
 WEIGHT SUMMARY AND BIOMETRICS  Vitals Temp: (!) 97.4 F (36.3 C) BP: 114/76 Pulse Rate: 83 SpO2: 98 %   Anthropometric Measurements Height: 5' 8 (1.727 m) Weight: 213 lb (96.6 kg) BMI (Calculated): 32.39 Weight at Last Visit: 215 lb Weight Lost Since Last Visit: 2 lb Weight Gained Since Last Visit: 0 lb Starting Weight: 258 lb Total Weight Loss (lbs): 45 lb (20.4 kg) Peak Weight: 260 lb   Body Composition  Body Fat %: 27.8 % Fat Mass (lbs): 59.4 lbs Muscle Mass (lbs): 146.4 lbs Total Body Water (lbs): 105.6 lbs Visceral Fat Rating : 13   Other Clinical Data RMR: 1757 Fasting: yes Labs: no Today's Visit #: 28 Starting Date: 12/23/21    Chief Complaint:   OBESITY Charles Diaz is here to discuss his progress with his obesity treatment plan.  He is on the the Category 3 Plan and states he is following his eating plan approximately 75 % of the time.  He states he is exercising Cardio and Strength Traniing 60 minutes 3 times per week.  Interim History:  08/02/2022- started on Zepbound  therapy He is currently on weekly Zepbound  10mg  since on/about 04/11/2023 Denies mass in neck, dysphagia, dyspepsia, persistent hoarseness, abdominal pain, or N/V/C   03/18/2024 begin titrating down Zepbound - will take last 10mg  injection today then start 7.5mg  next week  Of Note- His insurance will not cover injectable AOMs after 1 Jan 26  Subjective:   1. SOB (shortness of breath) on exertion He endorses dyspnea with extreme exertion, denies CP  12/23/21 07:00  RMR 2146    04/08/24 07:00  RMR 1757   Metabolic rate decreased and below anticipated RMR  2. Hepatic steatosis Discussed Labs Lipid Panel     Component Value Date/Time   CHOL 149 03/18/2024 0946   TRIG 137 03/18/2024 0946   HDL 37 (L) 03/18/2024 0946   CHOLHDL 4.0 03/18/2024 0946   CHOLHDL 5 06/03/2022 0924   VLDL 55.8 (H) 06/03/2022 0924   LDLCALC 88 03/18/2024 0946   LDLDIRECT 69.0 06/03/2022 0924    LABVLDL 24 03/18/2024 0946    Lipid panel stable  HDL remains under goal- he has family hx of HLD (father and sister) 03/18/2024 Liver Enzymes- normal  3. Insulin  resistance Discussed Labs Vitamin B12 232 - 1,245 pg/mL 408    Latest Reference Range & Units 03/18/24 09:46  Glucose 70 - 99 mg/dL 84  Hemoglobin J8R 4.8 - 5.6 % 5.3  Est. average glucose Bld gHb Est-mCnc mg/dL 894  INSULIN  2.6 - 24.9 uIU/mL 11.4   B12 level < 500 CBG and A1c at goal Insulin  level worsened and is above goal of <5 08/02/2022- started on Zepbound  therapy He is currently on weekly Zepbound  10mg  since on/about 04/11/2023 Denies mass in neck, dysphagia, dyspepsia, persistent hoarseness, abdominal pain, or N/V/C   03/18/2024 begin titrating down Zepbound - will take last 10mg  injection today then start 7.5mg  next week  4. Vitamin D  deficiency Discussed Labs  Latest Reference Range & Units 03/18/24 09:46  Vitamin D , 25-Hydroxy 30.0 - 100.0 ng/mL 37.7       Vit D level below goal of 50-70 He is on weekly Ergocalciferol - denies N/V/Muscle Weakness  Assessment/Plan:   1. SOB (shortness of breath) on exertion Convert from Cat 3 to 2 MP to account for reduced metabolic rate  2. Hepatic steatosis (Primary) Continue healthy eating and regular exercise  3. Insulin  resistance Take final dose of Zepbound  10mg  today, then start 7.5mg  next week-  does not require refill today  Start daily OTC MVI  4. Vitamin D  deficiency Refill Vitamin D , Ergocalciferol , (DRISDOL ) 1.25 MG (50000 UNIT) CAPS capsule Take 1 capsule (50,000 Units total) by mouth every 7 (seven) days. Dispense: 4 capsule, Refills: 0 ordered   Start daily OTC MVI  5. Obesity, Current BMI 32.4 Take final dose of Zepbound  10mg  today, then start 7.5mg  next week- does not require refill today  Charles Diaz is currently in the action stage of change. As such, his goal is to continue with weight loss efforts. He has agreed to the Category 2 Plan.   Exercise  goals: For substantial health benefits, adults should do at least 150 minutes (2 hours and 30 minutes) a week of moderate-intensity, or 75 minutes (1 hour and 15 minutes) a week of vigorous-intensity aerobic physical activity, or an equivalent combination of moderate- and vigorous-intensity aerobic activity. Aerobic activity should be performed in episodes of at least 10 minutes, and preferably, it should be spread throughout the week.  Behavioral modification strategies: increasing lean protein intake, decreasing simple carbohydrates, increasing vegetables, increasing water intake, no skipping meals, meal planning and cooking strategies, keeping healthy foods in the home, ways to avoid boredom eating, and planning for success.  Charles Diaz has agreed to follow-up with our clinic in 4 weeks. He was informed of the importance of frequent follow-up visits to maximize his success with intensive lifestyle modifications for his multiple health conditions.   Objective:   Blood pressure 114/76, pulse 83, temperature (!) 97.4 F (36.3 C), height 5' 8 (1.727 m), weight 213 lb (96.6 kg), SpO2 98%. Body mass index is 32.39 kg/m.  General: Cooperative, alert, well developed, in no acute distress. HEENT: Conjunctivae and lids unremarkable. Cardiovascular: Regular rhythm.  Lungs: Normal work of breathing. Neurologic: No focal deficits.   Lab Results  Component Value Date   CREATININE 1.13 03/18/2024   BUN 18 03/18/2024   NA 141 03/18/2024   K 3.9 03/18/2024   CL 106 03/18/2024   CO2 19 (L) 03/18/2024   Lab Results  Component Value Date   ALT 23 03/18/2024   AST 26 03/18/2024   ALKPHOS 87 03/18/2024   BILITOT 0.5 03/18/2024   Lab Results  Component Value Date   HGBA1C 5.3 03/18/2024   HGBA1C 5.2 09/27/2023   HGBA1C 5.4 06/01/2023   HGBA1C 5.3 11/28/2022   HGBA1C 5.4 12/23/2021   Lab Results  Component Value Date   INSULIN  11.4 03/18/2024   INSULIN  5.3 09/27/2023   INSULIN  12.6 06/01/2023    INSULIN  16.9 11/28/2022   INSULIN  9.1 12/23/2021   Lab Results  Component Value Date   TSH 2.95 06/03/2022   Lab Results  Component Value Date   CHOL 149 03/18/2024   HDL 37 (L) 03/18/2024   LDLCALC 88 03/18/2024   LDLDIRECT 69.0 06/03/2022   TRIG 137 03/18/2024   CHOLHDL 4.0 03/18/2024   Lab Results  Component Value Date   VD25OH 37.7 03/18/2024   VD25OH 50.8 09/27/2023   VD25OH 31.3 06/01/2023   Lab Results  Component Value Date   WBC 6.9 06/03/2022   HGB 14.8 06/03/2022   HCT 43.0 06/03/2022   MCV 85.2 06/03/2022   PLT 235.0 06/03/2022   Lab Results  Component Value Date   IRON 89 10/01/2021   TIBC 357.0 10/01/2021   FERRITIN 72.9 10/01/2021   Attestation Statements:   Reviewed by clinician on day of visit: allergies, medications, problem list, medical history, surgical history, family history, social history, and  previous encounter notes.  I have reviewed the above documentation for accuracy and completeness, and I agree with the above. -  Gared Gillie d. Garet Hooton, NP-C

## 2024-04-17 ENCOUNTER — Other Ambulatory Visit (HOSPITAL_COMMUNITY): Payer: Self-pay

## 2024-05-06 ENCOUNTER — Ambulatory Visit (INDEPENDENT_AMBULATORY_CARE_PROVIDER_SITE_OTHER): Payer: Self-pay | Admitting: Adult Health

## 2024-05-27 ENCOUNTER — Other Ambulatory Visit (HOSPITAL_COMMUNITY): Payer: Self-pay

## 2024-05-27 ENCOUNTER — Ambulatory Visit (INDEPENDENT_AMBULATORY_CARE_PROVIDER_SITE_OTHER): Admitting: Adult Health

## 2024-05-27 VITALS — BP 93/65 | HR 80 | Temp 98.0°F | Ht 68.0 in | Wt 216.0 lb

## 2024-05-27 DIAGNOSIS — K760 Fatty (change of) liver, not elsewhere classified: Secondary | ICD-10-CM | POA: Diagnosis not present

## 2024-05-27 DIAGNOSIS — Z6833 Body mass index (BMI) 33.0-33.9, adult: Secondary | ICD-10-CM

## 2024-05-27 DIAGNOSIS — E88819 Insulin resistance, unspecified: Secondary | ICD-10-CM | POA: Diagnosis not present

## 2024-05-27 DIAGNOSIS — E782 Mixed hyperlipidemia: Secondary | ICD-10-CM

## 2024-05-27 DIAGNOSIS — E66812 Obesity, class 2: Secondary | ICD-10-CM

## 2024-05-27 DIAGNOSIS — E559 Vitamin D deficiency, unspecified: Secondary | ICD-10-CM

## 2024-05-27 DIAGNOSIS — E669 Obesity, unspecified: Secondary | ICD-10-CM

## 2024-05-27 MED ORDER — VITAMIN D (ERGOCALCIFEROL) 1.25 MG (50000 UNIT) PO CAPS
50000.0000 [IU] | ORAL_CAPSULE | ORAL | 0 refills | Status: DC
  Filled 2024-05-27 – 2024-06-07 (×2): qty 4, 28d supply, fill #0

## 2024-05-27 NOTE — Progress Notes (Signed)
 WEIGHT SUMMARY AND BIOMETRICS  Vitals Temp: 98 F (36.7 C) BP: 93/65 Pulse Rate: 80 SpO2: 95 %   Anthropometric Measurements Height: 5' 8 (1.727 m) Weight: 216 lb (98 kg) BMI (Calculated): 32.85 Weight at Last Visit: 213 lb Weight Lost Since Last Visit: 0 Weight Gained Since Last Visit: 3 lb Starting Weight: 258 lb Total Weight Loss (lbs): 42 lb (19.1 kg) Peak Weight: 260 lb   Body Composition  Body Fat %: 29.3 % Fat Mass (lbs): 63.6 lbs Muscle Mass (lbs): 145.6 lbs Total Body Water (lbs): 108.2 lbs Visceral Fat Rating : 14   Other Clinical Data Fasting: no Labs: no Today's Visit #: 29 Starting Date: 12/23/21    Chief Complaint:   OBESITY Rondal is here to discuss his progress with his obesity treatment plan.  He is on the Cat 2 MP and states he is following his eating plan approximately 40 % of the time.  He states he is exercising Strength Training 60 minutes 3 times per week.   Interim History:  08/02/2022- started on Zepbound  therapy He is currently on weekly Zepbound  10mg  since on/about 04/11/2023 Denies mass in neck, dysphagia, dyspepsia, persistent hoarseness, abdominal pain, or N/V/C  03/18/2024 begin titrating down Zepbound - last dose of 7.5mg  was > 2 weeks ago  04/08/2024 Convert from Cat 3 to 2 MP to account for reduced metabolic rate   Subjective:   1. Vitamin D  deficiency  Latest Reference Range & Units 06/01/23 14:49 09/27/23 12:35 03/18/24 09:46  Vitamin D , 25-Hydroxy 30.0 - 100.0 ng/mL 31.3 50.8 37.7   He is on weekly Ergocalciferol - denies N/V/Muscle Weakness  2. Hepatic steatosis Narrative & Impression 06/08/2022  CLINICAL DATA:  Elevated liver function tests.   EXAM: ABDOMEN ULTRASOUND COMPLETE   COMPARISON:  None Available.   FINDINGS: Gallbladder: Gallstones noted in the gallbladder. No wall thickening visualized. No sonographic Murphy sign noted by sonographer.   Common bile duct: Diameter: 2.4 mm   Liver: No  focal lesion identified. Diffuse increased echotexture of the liver. Portal vein is patent on color Doppler imaging with normal direction of blood flow towards the liver.   IVC: Ultrasound technologist reports limited visualization.   Pancreas: Ultrasound technologist reports limited visualization.   Spleen: Size and appearance within normal limits.   Right Kidney: Length: 10.8 cm. Echogenicity within normal limits. No mass or hydronephrosis visualized.   Left Kidney: Length: 12.2 cm. Echogenicity within normal limits. No mass or hydronephrosis visualized.   Abdominal aorta: No aneurysm visualized.   Other findings: None.   IMPRESSION: 1. Cholelithiasis without sonographic evidence of acute cholecystitis. 2. Fatty infiltration of liver.    3. Insulin  resistance  Latest Reference Range & Units 06/01/23 14:49 09/27/23 12:35 03/18/24 09:46  INSULIN  2.6 - 24.9 uIU/mL 12.6 5.3 11.4   08/02/2022- started on Zepbound  therapy He is currently on weekly Zepbound  10mg  since on/about 04/11/2023 Denies mass in neck, dysphagia, dyspepsia, persistent hoarseness, abdominal pain, or N/V/C  03/18/2024 begin titrating down Zepbound - last dose of 7.5mg  was > 2 weeks ago Emphasized that proper nutrition and regular exercise can maintain his weight loss. Adding in regular cardiovascular exercise can aid in further weight loss.  4. Elevated triglycerides with high cholesterol Lipid Panel     Component Value Date/Time   CHOL 149 03/18/2024 0946   TRIG 137 03/18/2024 0946   HDL 37 (L) 03/18/2024 0946   CHOLHDL 4.0 03/18/2024 0946   CHOLHDL 5 06/03/2022 0924   VLDL 55.8 (H) 06/03/2022  0924   LDLCALC 88 03/18/2024 0946   LDLDIRECT 69.0 06/03/2022 0924   LABVLDL 24 03/18/2024 0946     Latest Reference Range & Units 06/01/23 14:49 09/27/23 12:35 03/18/24 09:46  Triglycerides 0 - 149 mg/dL 850 760 (H) 862  (H): Data is abnormally high  fenofibrate  160 MG tablet  omega-3 acid ethyl esters  (LOVAZA ) 1 g capsule   Assessment/Plan:   1. Vitamin D  deficiency (Primary) Refill   Vitamin D , Ergocalciferol , (DRISDOL ) 1.25 MG (50000 UNIT) CAPS capsule Take 1 capsule (50,000 Units total) by mouth every 7 (seven) days. Dispense: 4 capsule, Refills: 0 of 0 remaining   2. Hepatic steatosis Continue healthy eating and increase regular cardiovascular exercise Avoid hepatotoxic substances  3. Insulin  resistance Continue healthy eating and increase regular cardiovascular exercise  4. Elevated triglycerides with high cholesterol Continue healthy eating and increase regular cardiovascular exercise  5. Obesity, Current BMI 33.0  Athen is currently in the action stage of change. As such, his goal is to continue with weight loss efforts. He has agreed to the Category 2 Plan.   Exercise goals: For substantial health benefits, adults should do at least 150 minutes (2 hours and 30 minutes) a week of moderate-intensity, or 75 minutes (1 hour and 15 minutes) a week of vigorous-intensity aerobic physical activity, or an equivalent combination of moderate- and vigorous-intensity aerobic activity. Aerobic activity should be performed in episodes of at least 10 minutes, and preferably, it should be spread throughout the week.  Behavioral modification strategies: increasing lean protein intake, decreasing simple carbohydrates, increasing vegetables, increasing water intake, no skipping meals, meal planning and cooking strategies, keeping healthy foods in the home, and planning for success.  Einer has agreed to follow-up with our clinic in 4 weeks. He was informed of the importance of frequent follow-up visits to maximize his success with intensive lifestyle modifications for his multiple health conditions.   Objective:   Blood pressure 93/65, pulse 80, temperature 98 F (36.7 C), height 5' 8 (1.727 m), weight 216 lb (98 kg), SpO2 95%. Body mass index is 32.84 kg/m.  General: Cooperative, alert, well  developed, in no acute distress. HEENT: Conjunctivae and lids unremarkable. Cardiovascular: Regular rhythm.  Lungs: Normal work of breathing. Neurologic: No focal deficits.   Lab Results  Component Value Date   CREATININE 1.13 03/18/2024   BUN 18 03/18/2024   NA 141 03/18/2024   K 3.9 03/18/2024   CL 106 03/18/2024   CO2 19 (L) 03/18/2024   Lab Results  Component Value Date   ALT 23 03/18/2024   AST 26 03/18/2024   ALKPHOS 87 03/18/2024   BILITOT 0.5 03/18/2024   Lab Results  Component Value Date   HGBA1C 5.3 03/18/2024   HGBA1C 5.2 09/27/2023   HGBA1C 5.4 06/01/2023   HGBA1C 5.3 11/28/2022   HGBA1C 5.4 12/23/2021   Lab Results  Component Value Date   INSULIN  11.4 03/18/2024   INSULIN  5.3 09/27/2023   INSULIN  12.6 06/01/2023   INSULIN  16.9 11/28/2022   INSULIN  9.1 12/23/2021   Lab Results  Component Value Date   TSH 2.95 06/03/2022   Lab Results  Component Value Date   CHOL 149 03/18/2024   HDL 37 (L) 03/18/2024   LDLCALC 88 03/18/2024   LDLDIRECT 69.0 06/03/2022   TRIG 137 03/18/2024   CHOLHDL 4.0 03/18/2024   Lab Results  Component Value Date   VD25OH 37.7 03/18/2024   VD25OH 50.8 09/27/2023   VD25OH 31.3 06/01/2023   Lab Results  Component Value Date   WBC 6.9 06/03/2022   HGB 14.8 06/03/2022   HCT 43.0 06/03/2022   MCV 85.2 06/03/2022   PLT 235.0 06/03/2022   Lab Results  Component Value Date   IRON 89 10/01/2021   TIBC 357.0 10/01/2021   FERRITIN 72.9 10/01/2021   Attestation Statements:   Reviewed by clinician on day of visit: allergies, medications, problem list, medical history, surgical history, family history, social history, and previous encounter notes.  I have reviewed the above documentation for accuracy and completeness, and I agree with the above. -  Bobbye Petti d. [place] Skalicky, NP-C

## 2024-06-03 ENCOUNTER — Ambulatory Visit (INDEPENDENT_AMBULATORY_CARE_PROVIDER_SITE_OTHER): Payer: Self-pay | Admitting: Adult Health

## 2024-06-06 ENCOUNTER — Other Ambulatory Visit (HOSPITAL_COMMUNITY): Payer: Self-pay

## 2024-06-07 ENCOUNTER — Other Ambulatory Visit (HOSPITAL_COMMUNITY): Payer: Self-pay

## 2024-06-24 ENCOUNTER — Encounter (INDEPENDENT_AMBULATORY_CARE_PROVIDER_SITE_OTHER): Payer: Self-pay | Admitting: Adult Health

## 2024-06-24 ENCOUNTER — Ambulatory Visit (INDEPENDENT_AMBULATORY_CARE_PROVIDER_SITE_OTHER): Admitting: Adult Health

## 2024-06-24 VITALS — BP 106/71 | HR 85 | Temp 98.2°F | Ht 68.0 in | Wt 223.0 lb

## 2024-06-24 DIAGNOSIS — E559 Vitamin D deficiency, unspecified: Secondary | ICD-10-CM | POA: Diagnosis not present

## 2024-06-24 DIAGNOSIS — Z6839 Body mass index (BMI) 39.0-39.9, adult: Secondary | ICD-10-CM

## 2024-06-24 DIAGNOSIS — Z6833 Body mass index (BMI) 33.0-33.9, adult: Secondary | ICD-10-CM

## 2024-06-24 DIAGNOSIS — E669 Obesity, unspecified: Secondary | ICD-10-CM | POA: Diagnosis not present

## 2024-06-24 DIAGNOSIS — E88819 Insulin resistance, unspecified: Secondary | ICD-10-CM

## 2024-06-24 DIAGNOSIS — K760 Fatty (change of) liver, not elsewhere classified: Secondary | ICD-10-CM

## 2024-06-24 MED ORDER — VITAMIN D (ERGOCALCIFEROL) 1.25 MG (50000 UNIT) PO CAPS: 50000.0000 [IU] | ORAL_CAPSULE | ORAL | 0 refills | Status: AC

## 2024-06-24 NOTE — Progress Notes (Signed)
 "    WEIGHT SUMMARY AND BIOMETRICS  Vitals Temp: 98.2 F (36.8 C) BP: 106/71 Pulse Rate: 85 SpO2: 99 %   Anthropometric Measurements Height: 5' 8 (1.727 m) Weight: 223 lb (101.2 kg) BMI (Calculated): 33.91 Weight at Last Visit: 216lb Weight Lost Since Last Visit: 0lb Weight Gained Since Last Visit: 7lb Starting Weight: 258lb Total Weight Loss (lbs): 35 lb (15.9 kg) Peak Weight: 260lb   Body Composition  Body Fat %: 30.4 % Fat Mass (lbs): 68 lbs Muscle Mass (lbs): 148 lbs Total Body Water (lbs): 110 lbs Visceral Fat Rating : 15   Other Clinical Data Fasting: No Labs: No Today's Visit #: 30 Starting Date: 12/23/21    Chief Complaint:   OBESITY Charles Diaz is here to discuss his progress with his obesity treatment plan.  He is on the the Category 2 Plan and states he is following his eating plan approximately 10 % of the time.  He states he is exercising: [employer]   Interim History:  08/02/2022- started on Zepbound  therapy He is currently on weekly Zepbound  10mg  since on/about 04/11/2023 Denies mass in neck, dysphagia, dyspepsia, persistent hoarseness, abdominal pain, or N/V/C  03/18/2024 begin titrating down Zepbound - last dose of 7.5mg  was Oct 2025 He reports Nov 2025- stable appetite He reports Dec 2025- increased hunger levels and subsequent eating off plan.  Reviewed his employers policy for Zepbound  therapy- not covered in 2026 Discussed [employer] Options Patient was counseled on the importance of maintaining healthy lifestyle habits, including balanced nutrition, regular physical activity, and behavioral modifications, while taking antiobesity medication.   Patient verbalized understanding that medication is an adjunct to, not a replacement for, lifestyle changes and that the long-term success and weight maintenance depend on continued adherence to these strategies.   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 Vitamin D  deficiency  Latest Reference Range & Units 06/01/23 14:49 09/27/23 12:35 03/18/24 09:46  Vitamin D , 25-Hydroxy 30.0 - 100.0 ng/mL 31.3 50.8 37.7   He is on weekly Ergocalciferol - denies N/V/Muscle Weakness  2. Hepatic steatosis 08/02/2022- started on Zepbound  therapy He is currently on weekly Zepbound  10mg  since on/about 04/11/2023 Denies mass in neck, dysphagia, dyspepsia, persistent hoarseness, abdominal pain, or N/V/C  03/18/2024 begin titrating down Zepbound - last dose of 7.5mg  was Oct 2025 He reports Nov 2025- stable appetite He reports Dec 2025- increased hunger levels and subsequent eating off plan.  Reviewed his employers policy for Zepbound  therapy- not covered in 2026 Discussed [employer] Options Patient was counseled on the importance of maintaining healthy lifestyle habits, including balanced nutrition, regular physical activity, and behavioral modifications, while taking antiobesity medication.   Patient verbalized understanding that medication is an adjunct to, not a replacement for, lifestyle changes and that the long-term success and weight maintenance depend on continued adherence to these strategies.   3. Insulin  resistance 08/02/2022- started on Zepbound  therapy He is currently on weekly Zepbound  10mg  since on/about 04/11/2023 Denies mass in neck, dysphagia, dyspepsia, persistent hoarseness, abdominal pain, or N/V/C  03/18/2024 begin titrating down Zepbound - last dose of 7.5mg  was Oct 2025 He reports Nov 2025- stable appetite He reports Dec 2025- increased hunger levels and subsequent eating off plan.  Reviewed his employers policy for Zepbound  therapy- not covered in  2026 Discussed [employer] Options Patient was counseled on the  importance of maintaining healthy lifestyle habits, including balanced nutrition, regular physical activity, and behavioral modifications, while taking antiobesity medication.   Patient verbalized understanding that medication is an adjunct to, not a replacement for, lifestyle changes and that the long-term success and weight maintenance depend on continued adherence to these strategies.   Assessment/Plan:   1. Vitamin D  deficiency (Primary) Refill - Vitamin D , Ergocalciferol , (DRISDOL ) 1.25 MG (50000 UNIT) CAPS capsule; Take 1 capsule (50,000 Units total) by mouth every 7 (seven) days.  Dispense: 4 capsule; Refill: 0  2. Hepatic steatosis Add in regular cardiovascular exercise Increase compliance of Cat 2 MP from 10% to least 85%  3. Insulin  resistance Add in regular cardiovascular exercise Increase compliance of Cat 2 MP from 10% to least 85%  4. Obesity, Current BMI 33.1  Patient was counseled on the importance of maintaining healthy lifestyle habits, including balanced nutrition, regular physical activity, and behavioral modifications, while taking antiobesity medication.   Patient verbalized understanding that medication is an adjunct to, not a replacement for, lifestyle changes and that the long-term success and weight maintenance depend on continued adherence to these strategies.   Charles Diaz is not currently in the action stage of change. As such, his goal is to get back to weightloss efforts . He has agreed to the Category 2 Plan.   Exercise goals: For substantial health benefits, adults should do at least 150 minutes (2 hours and 30 minutes) a week of moderate-intensity, or 75 minutes (1 hour and 15 minutes) a week of vigorous-intensity aerobic physical activity, or an equivalent combination of moderate- and vigorous-intensity aerobic activity. Aerobic activity should be performed in episodes of at least 10 minutes, and preferably, it should be spread throughout the week.  Behavioral  modification strategies: increasing lean protein intake, decreasing simple carbohydrates, increasing vegetables, increasing water intake, decreasing sodium intake, decreasing eating out, no skipping meals, meal planning and cooking strategies, keeping healthy foods in the home, planning for success, and decreasing junk food.  Charles Diaz has agreed to follow-up with our clinic in 4 weeks. He was informed of the importance of frequent follow-up visits to maximize his success with intensive lifestyle modifications for his multiple health conditions.   Check Fasting Labs at next OV  Objective:   Blood pressure 106/71, pulse 85, temperature 98.2 F (36.8 C), height 5' 8 (1.727 m), weight 223 lb (101.2 kg), SpO2 99%. Body mass index is 33.91 kg/m.  General: Cooperative, alert, well developed, in no acute distress. HEENT: Conjunctivae and lids unremarkable. Cardiovascular: Regular rhythm.  Lungs: Normal work of breathing. Neurologic: No focal deficits.   Lab Results  Component Value Date   CREATININE 1.13 03/18/2024   BUN 18 03/18/2024   NA 141 03/18/2024   K 3.9 03/18/2024   CL 106 03/18/2024   CO2 19 (L) 03/18/2024   Lab Results  Component Value Date   ALT 23 03/18/2024   AST 26 03/18/2024   ALKPHOS 87 03/18/2024   BILITOT 0.5 03/18/2024   Lab Results  Component Value Date   HGBA1C 5.3 03/18/2024   HGBA1C 5.2 09/27/2023   HGBA1C 5.4 06/01/2023   HGBA1C 5.3 11/28/2022   HGBA1C 5.4 12/23/2021   Lab Results  Component Value Date   INSULIN  11.4 03/18/2024   INSULIN  5.3 09/27/2023   INSULIN  12.6 06/01/2023   INSULIN  16.9 11/28/2022   INSULIN  9.1 12/23/2021   Lab Results  Component Value Date   TSH 2.95  06/03/2022   Lab Results  Component Value Date   CHOL 149 03/18/2024   HDL 37 (L) 03/18/2024   LDLCALC 88 03/18/2024   LDLDIRECT 69.0 06/03/2022   TRIG 137 03/18/2024   CHOLHDL 4.0 03/18/2024   Lab Results  Component Value Date   VD25OH 37.7 03/18/2024   VD25OH 50.8  09/27/2023   VD25OH 31.3 06/01/2023   Lab Results  Component Value Date   WBC 6.9 06/03/2022   HGB 14.8 06/03/2022   HCT 43.0 06/03/2022   MCV 85.2 06/03/2022   PLT 235.0 06/03/2022   Lab Results  Component Value Date   IRON 89 10/01/2021   TIBC 357.0 10/01/2021   FERRITIN 72.9 10/01/2021   Attestation Statements:   Reviewed by clinician on day of visit: allergies, medications, problem list, medical history, surgical history, family history, social history, and previous encounter notes.  Time spent on visit including pre-visit chart review and post-visit care and charting was 27 minutes.   I have reviewed the above documentation for accuracy and completeness, and I agree with the above. -  Milany Geck d. Braxten Memmer, NP-C "

## 2024-06-25 ENCOUNTER — Ambulatory Visit (INDEPENDENT_AMBULATORY_CARE_PROVIDER_SITE_OTHER): Admitting: Adult Health

## 2024-07-17 ENCOUNTER — Encounter: Payer: BC Managed Care – PPO | Admitting: Internal Medicine

## 2024-07-23 ENCOUNTER — Ambulatory Visit (INDEPENDENT_AMBULATORY_CARE_PROVIDER_SITE_OTHER): Admitting: Adult Health

## 2024-07-31 ENCOUNTER — Ambulatory Visit (INDEPENDENT_AMBULATORY_CARE_PROVIDER_SITE_OTHER): Admitting: Family Medicine

## 2024-09-16 ENCOUNTER — Encounter: Admitting: Internal Medicine
